# Patient Record
Sex: Male | Born: 1970 | Race: Black or African American | Hispanic: No | Marital: Married | Smoking: Former smoker
Health system: Southern US, Community
[De-identification: ages and names within clinical notes are randomized; demographics above are authoritative.]

## PROBLEM LIST (undated history)

## (undated) DIAGNOSIS — R569 Unspecified convulsions: Secondary | ICD-10-CM

## (undated) DIAGNOSIS — N402 Nodular prostate without lower urinary tract symptoms: Secondary | ICD-10-CM

## (undated) DIAGNOSIS — G4730 Sleep apnea, unspecified: Secondary | ICD-10-CM

## (undated) DIAGNOSIS — R45851 Suicidal ideations: Secondary | ICD-10-CM

## (undated) DIAGNOSIS — F419 Anxiety disorder, unspecified: Secondary | ICD-10-CM

## (undated) DIAGNOSIS — R29898 Other symptoms and signs involving the musculoskeletal system: Secondary | ICD-10-CM

## (undated) DIAGNOSIS — M549 Dorsalgia, unspecified: Secondary | ICD-10-CM

## (undated) DIAGNOSIS — Z87898 Personal history of other specified conditions: Secondary | ICD-10-CM

## (undated) DIAGNOSIS — N35919 Unspecified urethral stricture, male, unspecified site: Secondary | ICD-10-CM

## (undated) DIAGNOSIS — K429 Umbilical hernia without obstruction or gangrene: Secondary | ICD-10-CM

## (undated) DIAGNOSIS — F331 Major depressive disorder, recurrent, moderate: Secondary | ICD-10-CM

## (undated) DIAGNOSIS — K219 Gastro-esophageal reflux disease without esophagitis: Secondary | ICD-10-CM

## (undated) DIAGNOSIS — M1990 Unspecified osteoarthritis, unspecified site: Secondary | ICD-10-CM

## (undated) HISTORY — DX: Unspecified convulsions: R56.9

## (undated) HISTORY — DX: Other symptoms and signs involving the musculoskeletal system: R29.898

## (undated) HISTORY — PX: PROSTATE BIOPSY: SHX241

## (undated) HISTORY — DX: Personal history of other specified conditions: Z87.898

## (undated) HISTORY — DX: Dorsalgia, unspecified: M54.9

## (undated) HISTORY — PX: URETHRAL STRICTURE DILATATION: SHX477

## (undated) HISTORY — DX: Major depressive disorder, recurrent, moderate: F33.1

## (undated) HISTORY — DX: Suicidal ideations: R45.851

## (undated) HISTORY — DX: Nodular prostate without lower urinary tract symptoms: N40.2

## (undated) HISTORY — PX: FRACTURE SURGERY: SHX138

## (undated) HISTORY — DX: Umbilical hernia without obstruction or gangrene: K42.9

---

## 2011-07-24 DIAGNOSIS — R55 Syncope and collapse: Secondary | ICD-10-CM

## 2011-08-27 ENCOUNTER — Ambulatory Visit (HOSPITAL_COMMUNITY)
Admission: RE | Admit: 2011-08-27 | Discharge: 2011-08-27 | Disposition: A | Discharge status: Home / Self Care | Payer: BC Managed Care – PPO | Source: Ambulatory Visit | Attending: Internal Medicine | Admitting: Internal Medicine

## 2011-08-27 DIAGNOSIS — R55 Syncope and collapse: Secondary | ICD-10-CM | POA: Insufficient documentation

## 2011-08-27 DIAGNOSIS — Z1389 Encounter for screening for other disorder: Secondary | ICD-10-CM | POA: Insufficient documentation

## 2011-08-29 NOTE — Procedures (Signed)
NAMEALECSANDER, [place]ABAU[place]              ACCOUNT NO.:  [id number]  MEDICAL RECORD NO.:  [id number]  LOCATION:  EE                           FACILITY:  [facility]  PHYSICIAN:  Haley Fuerstenberg A. Gerilyn Pilgrim, M.D. DATE OF BIRTH:  02-Mar-1971  DATE OF PROCEDURE:  08/27/2011 DATE OF DISCHARGE:  08/27/2011                             EEG INTERPRETATION   INDICATION:  A 41 year old who had a spell of syncope associated with convulsion and confusion.  The episode was preceded by chest pain an hour before.  Cardiac workup has been negative, however.  MEDICATIONS:  Xanax.  ANALYSIS:  A 16 channel recording using standard 10/20 measurement is conducted for 20 minutes.  There is a well-formed posterior dominant rhythm of 11 Hz which attenuates with eye opening.  There is beta activity observed in the frontal areas.  Awake and drowsy activities are recorded.  Photic stimulation and hyperventilation are carried out without abnormal changes in background activity.  There is no focal or lateralized slowing.  There is no epileptiform activity.  IMPRESSION:  Normal recording of awake and drowsy states.  A single recording does not rule out epileptic seizures.  If clinically indicated, a sleep-deprived or extended EEG should be useful.     Roque Schill A. Gerilyn Pilgrim, M.D.     KAD/MEDQ  D:  08/28/2011  T:  08/29/2011  Job:  [id number]

## 2013-07-24 DIAGNOSIS — L0291 Cutaneous abscess, unspecified: Secondary | ICD-10-CM | POA: Insufficient documentation

## 2013-09-01 DIAGNOSIS — J40 Bronchitis, not specified as acute or chronic: Secondary | ICD-10-CM | POA: Insufficient documentation

## 2013-09-01 DIAGNOSIS — R059 Cough, unspecified: Secondary | ICD-10-CM | POA: Insufficient documentation

## 2015-01-23 DIAGNOSIS — R1013 Epigastric pain: Secondary | ICD-10-CM | POA: Insufficient documentation

## 2015-01-23 DIAGNOSIS — R079 Chest pain, unspecified: Secondary | ICD-10-CM | POA: Insufficient documentation

## 2015-01-23 DIAGNOSIS — M25512 Pain in left shoulder: Secondary | ICD-10-CM | POA: Insufficient documentation

## 2015-11-22 DIAGNOSIS — H9190 Unspecified hearing loss, unspecified ear: Secondary | ICD-10-CM | POA: Diagnosis not present

## 2015-11-22 DIAGNOSIS — F804 Speech and language development delay due to hearing loss: Secondary | ICD-10-CM | POA: Diagnosis not present

## 2015-11-28 DIAGNOSIS — H9190 Unspecified hearing loss, unspecified ear: Secondary | ICD-10-CM | POA: Diagnosis not present

## 2015-11-28 DIAGNOSIS — F804 Speech and language development delay due to hearing loss: Secondary | ICD-10-CM | POA: Diagnosis not present

## 2015-11-30 DIAGNOSIS — F804 Speech and language development delay due to hearing loss: Secondary | ICD-10-CM | POA: Diagnosis not present

## 2015-11-30 DIAGNOSIS — H9190 Unspecified hearing loss, unspecified ear: Secondary | ICD-10-CM | POA: Diagnosis not present

## 2015-12-05 DIAGNOSIS — H9190 Unspecified hearing loss, unspecified ear: Secondary | ICD-10-CM | POA: Diagnosis not present

## 2015-12-05 DIAGNOSIS — F804 Speech and language development delay due to hearing loss: Secondary | ICD-10-CM | POA: Diagnosis not present

## 2015-12-07 DIAGNOSIS — H9190 Unspecified hearing loss, unspecified ear: Secondary | ICD-10-CM | POA: Diagnosis not present

## 2015-12-07 DIAGNOSIS — F804 Speech and language development delay due to hearing loss: Secondary | ICD-10-CM | POA: Diagnosis not present

## 2016-01-02 DIAGNOSIS — M7551 Bursitis of right shoulder: Secondary | ICD-10-CM | POA: Insufficient documentation

## 2016-01-03 DIAGNOSIS — M7551 Bursitis of right shoulder: Secondary | ICD-10-CM | POA: Diagnosis not present

## 2016-01-03 DIAGNOSIS — M25511 Pain in right shoulder: Secondary | ICD-10-CM | POA: Diagnosis not present

## 2016-01-03 DIAGNOSIS — Z6830 Body mass index (BMI) 30.0-30.9, adult: Secondary | ICD-10-CM | POA: Diagnosis not present

## 2016-10-13 DIAGNOSIS — Z6830 Body mass index (BMI) 30.0-30.9, adult: Secondary | ICD-10-CM | POA: Insufficient documentation

## 2016-10-13 DIAGNOSIS — Z0000 Encounter for general adult medical examination without abnormal findings: Secondary | ICD-10-CM | POA: Insufficient documentation

## 2016-10-14 DIAGNOSIS — Z0000 Encounter for general adult medical examination without abnormal findings: Secondary | ICD-10-CM | POA: Diagnosis not present

## 2016-10-14 DIAGNOSIS — M545 Low back pain: Secondary | ICD-10-CM | POA: Diagnosis not present

## 2016-10-14 DIAGNOSIS — Z6830 Body mass index (BMI) 30.0-30.9, adult: Secondary | ICD-10-CM | POA: Diagnosis not present

## 2018-05-26 DIAGNOSIS — N393 Stress incontinence (female) (male): Secondary | ICD-10-CM | POA: Insufficient documentation

## 2018-05-26 DIAGNOSIS — K429 Umbilical hernia without obstruction or gangrene: Secondary | ICD-10-CM | POA: Diagnosis not present

## 2018-05-26 DIAGNOSIS — M62838 Other muscle spasm: Secondary | ICD-10-CM | POA: Insufficient documentation

## 2018-05-26 DIAGNOSIS — K4090 Unilateral inguinal hernia, without obstruction or gangrene, not specified as recurrent: Secondary | ICD-10-CM | POA: Diagnosis not present

## 2018-05-26 DIAGNOSIS — R339 Retention of urine, unspecified: Secondary | ICD-10-CM | POA: Diagnosis not present

## 2018-05-26 DIAGNOSIS — R319 Hematuria, unspecified: Secondary | ICD-10-CM | POA: Diagnosis not present

## 2018-05-26 DIAGNOSIS — M549 Dorsalgia, unspecified: Secondary | ICD-10-CM | POA: Diagnosis not present

## 2018-05-26 DIAGNOSIS — Z79899 Other long term (current) drug therapy: Secondary | ICD-10-CM | POA: Diagnosis not present

## 2018-05-26 DIAGNOSIS — K654 Sclerosing mesenteritis: Secondary | ICD-10-CM | POA: Diagnosis not present

## 2018-05-26 DIAGNOSIS — Z5321 Procedure and treatment not carried out due to patient leaving prior to being seen by health care provider: Secondary | ICD-10-CM | POA: Diagnosis not present

## 2018-05-27 ENCOUNTER — Emergency Department (HOSPITAL_COMMUNITY): Payer: BLUE CROSS/BLUE SHIELD

## 2018-05-27 ENCOUNTER — Other Ambulatory Visit: Payer: Self-pay

## 2018-05-27 ENCOUNTER — Encounter (HOSPITAL_COMMUNITY): Payer: Self-pay | Admitting: Emergency Medicine

## 2018-05-27 ENCOUNTER — Emergency Department (HOSPITAL_COMMUNITY)
Admission: EM | Admit: 2018-05-27 | Discharge: 2018-05-27 | Disposition: A | Discharge status: Home / Self Care | Payer: BLUE CROSS/BLUE SHIELD | Attending: Emergency Medicine | Admitting: Emergency Medicine

## 2018-05-27 DIAGNOSIS — N3091 Cystitis, unspecified with hematuria: Secondary | ICD-10-CM | POA: Diagnosis not present

## 2018-05-27 DIAGNOSIS — R319 Hematuria, unspecified: Secondary | ICD-10-CM | POA: Diagnosis not present

## 2018-05-27 DIAGNOSIS — N433 Hydrocele, unspecified: Secondary | ICD-10-CM

## 2018-05-27 DIAGNOSIS — N341 Nonspecific urethritis: Secondary | ICD-10-CM | POA: Insufficient documentation

## 2018-05-27 DIAGNOSIS — N3090 Cystitis, unspecified without hematuria: Secondary | ICD-10-CM

## 2018-05-27 DIAGNOSIS — Z87891 Personal history of nicotine dependence: Secondary | ICD-10-CM | POA: Diagnosis not present

## 2018-05-27 DIAGNOSIS — N342 Other urethritis: Secondary | ICD-10-CM

## 2018-05-27 DIAGNOSIS — N50819 Testicular pain, unspecified: Secondary | ICD-10-CM

## 2018-05-27 HISTORY — DX: Unspecified urethral stricture, male, unspecified site: N35.919

## 2018-05-27 HISTORY — DX: Gastro-esophageal reflux disease without esophagitis: K21.9

## 2018-05-27 LAB — BASIC METABOLIC PANEL
Anion gap: 9 (ref 5–15)
BUN: 16 mg/dL (ref 6–20)
CO2: 23 mmol/L (ref 22–32)
Calcium: 9 mg/dL (ref 8.9–10.3)
Chloride: 106 mmol/L (ref 98–111)
Creatinine, Ser: 1.12 mg/dL (ref 0.61–1.24)
GFR calc Af Amer: >60 mL/min (ref >60)
GFR calc non Af Amer: >60 mL/min (ref >60)
Glucose, Bld: 118 mg/dL — ABNORMAL HIGH (ref 70–99)
Potassium: 3.9 mmol/L (ref 3.5–5.1)
Sodium: 138 mmol/L (ref 135–145)

## 2018-05-27 LAB — URINALYSIS, ROUTINE W REFLEX MICROSCOPIC
Bacteria, UA: NONE SEEN
Bilirubin Urine: NEGATIVE
Glucose, UA: NEGATIVE mg/dL
Ketones, ur: NEGATIVE mg/dL
Nitrite: NEGATIVE
Protein, ur: NEGATIVE mg/dL
RBC / HPF: >50 RBC/hpf — ABNORMAL HIGH (ref 0–5)
Specific Gravity, Urine: 1.031 — ABNORMAL HIGH (ref 1.005–1.030)
pH: 5 (ref 5.0–8.0)

## 2018-05-27 LAB — CBC WITH DIFFERENTIAL/PLATELET
Abs Immature Granulocytes: 0.02 10*3/uL (ref 0.00–0.07)
Basophils Absolute: 0 10*3/uL (ref 0.0–0.1)
Basophils Relative: 0 %
Eosinophils Absolute: 0 10*3/uL (ref 0.0–0.5)
Eosinophils Relative: 0 %
HCT: 47.9 % (ref 39.0–52.0)
Hemoglobin: 16.2 g/dL (ref 13.0–17.0)
Immature Granulocytes: 0 %
Lymphocytes Relative: 20 %
Lymphs Abs: 1.4 10*3/uL (ref 0.7–4.0)
MCH: 28.4 pg (ref 26.0–34.0)
MCHC: 33.8 g/dL (ref 30.0–36.0)
MCV: 84 fL (ref 80.0–100.0)
Monocytes Absolute: 0.4 10*3/uL (ref 0.1–1.0)
Monocytes Relative: 5 %
Neutro Abs: 5 10*3/uL (ref 1.7–7.7)
Neutrophils Relative %: 75 %
Platelets: 189 10*3/uL (ref 150–400)
RBC: 5.7 MIL/uL (ref 4.22–5.81)
RDW: 12.2 % (ref 11.5–15.5)
WBC: 6.7 10*3/uL (ref 4.0–10.5)
nRBC: 0 % (ref 0.0–0.2)

## 2018-05-27 MED ORDER — OXYCODONE-ACETAMINOPHEN 5-325 MG PO TABS: ORAL_TABLET | ORAL | 0 refills | Status: DC

## 2018-05-27 MED ORDER — LIDOCAINE HCL (PF) 1 % IJ SOLN
INTRAMUSCULAR | Status: AC
  Administered 2018-05-27: 2 mL
  Filled 2018-05-27: qty 2

## 2018-05-27 MED ORDER — CIPROFLOXACIN HCL 500 MG PO TABS: 500.0000 mg | ORAL_TABLET | Freq: Two times a day (BID) | ORAL | 0 refills | Status: DC

## 2018-05-27 MED ORDER — AZITHROMYCIN 250 MG PO TABS
1000.0000 mg | ORAL_TABLET | Freq: Once | ORAL | Status: AC
  Administered 2018-05-27: 1000 mg via ORAL
  Filled 2018-05-27: qty 4

## 2018-05-27 MED ORDER — CEFTRIAXONE SODIUM 250 MG IJ SOLR
250.0000 mg | Freq: Once | INTRAMUSCULAR | Status: AC
  Administered 2018-05-27: 250 mg via INTRAMUSCULAR
  Filled 2018-05-27: qty 250

## 2018-05-27 MED ORDER — OXYCODONE-ACETAMINOPHEN 5-325 MG PO TABS
2.0000 | ORAL_TABLET | Freq: Once | ORAL | Status: AC
  Administered 2018-05-27: 2 via ORAL
  Filled 2018-05-27: qty 2

## 2018-05-27 MED ORDER — HYDROMORPHONE HCL 1 MG/ML IJ SOLN
1.0000 mg | Freq: Once | INTRAMUSCULAR | Status: AC
  Administered 2018-05-27: 1 mg via INTRAVENOUS
  Filled 2018-05-27: qty 1

## 2018-05-27 NOTE — ED Provider Notes (Signed)
[facility] EMERGENCY DEPARTMENT Provider Note   CSN: [id number] Arrival date & time: 05/27/18  0757     History   Chief Complaint Chief Complaint  Patient presents with  . Hematuria    HPI Dale Bender is a 47 y.o. male.   Hematuria     Pt was seen at 0810. Per pt and his wife, c/o gradual onset and persistence of constant dysuria and hematuria since yesterday. Has been associated with suprapubic abd and testicular area "pain."  Pt states he was evaluated at [facility] yesterday, and was told he "needed to see a Urologist for a stricture." Denies flank pain, no fevers, no abd pain, no N/V/D, no rash.   Past Medical History:  Diagnosis Date  . GERD (gastroesophageal reflux disease)   . 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   Past Surgical History:  Procedure Laterality Date  . FRACTURE SURGERY     LT arm, LT leg  . URETHRAL STRICTURE DILATATION          Home Medications    Prior to Admission medications   Not on File    Family History No family history on file.  Social History Social History   Tobacco Use  . Smoking status: Former Research scientist (life sciences)  . Smokeless tobacco: Never Used  Substance Use Topics  . Alcohol use: Not Currently  . Drug use: Not Currently     Allergies   Patient has no known allergies.   Review of Systems Review of Systems  Genitourinary: Positive for hematuria.  ROS: Statement: All systems negative except as marked or noted in the HPI; Constitutional: Negative for fever and chills. ; ; Eyes: Negative for eye pain, redness and discharge. ; ; ENMT: Negative for ear pain, hoarseness, nasal congestion, sinus pressure and sore throat. ; ; Cardiovascular: Negative for chest pain, palpitations, diaphoresis, dyspnea and peripheral edema. ; ; Respiratory: Negative for cough, wheezing and stridor. ; ; Gastrointestinal: +suprapubic abd pain. Negative for nausea, vomiting, diarrhea, blood  in stool, hematemesis, jaundice and rectal bleeding. . ; ; Genitourinary: Negative for flank pain. +dysuria and hematuria. ; ; Genital:  No penile drainage or rash, +testicular pain, no testicular swelling, no scrotal rash or swelling. ;; Musculoskeletal: Negative for back pain and neck pain. Negative for swelling and trauma.; ; Skin: Negative for pruritus, rash, abrasions, blisters, bruising and skin lesion.; ; Neuro: Negative for headache, lightheadedness and neck stiffness. Negative for weakness, altered level of consciousness, altered mental status, extremity weakness, paresthesias, involuntary movement, seizure and syncope.       Physical Exam Updated Vital Signs BP 113/79 (BP Location: Right Arm)   Pulse 100   Temp 98.1 F (36.7 C) (Oral)   Resp 20   Ht 6\' 2"  (1.88 m)   Wt 106.6 kg   SpO2 100%   BMI 30.17 kg/m   Physical Exam 0815: Physical examination:  Nursing notes reviewed; Vital signs and O2 SAT reviewed;  Constitutional: Well developed, Well nourished, Well hydrated, Uncomfortable appearing.; Head:  Normocephalic, atraumatic; Eyes: EOMI, PERRL, No scleral icterus; ENMT: Mouth and pharynx normal, Mucous membranes moist; Neck: Supple, Full range of motion, No lymphadenopathy; Cardiovascular: Regular rate and rhythm, No gallop; Respiratory: Breath sounds clear & equal bilaterally, No wheezes.  Speaking full sentences with ease, Normal respiratory effort/excursion; Chest: Nontender, Movement normal; Abdomen: Soft, +mild suprapubic tenderness to palp. No rebound or guarding. Nondistended, Normal bowel sounds; Genitourinary: No CVA tenderness.  Genital exam performed with pt permission and male ED RN chaperone present during exam.  No perineal erythema.  No penile lesions or drainage.  No scrotal erythema, edema or tenderness to palp.  Normal testicular lie.  No testicular tenderness to palp.  +cremasteric reflexes bilat.  No inguinal LAN or palpable masses.;;; Extremities: Peripheral pulses  normal, No tenderness, No edema, No calf edema or asymmetry.; Neuro: AA&Ox3, Major CN grossly intact.  Speech clear. No gross focal motor or sensory deficits in extremities.; Skin: Color normal, Warm, Dry.   ED Treatments / Results  Labs (all labs ordered are listed, but only abnormal results are displayed)   EKG None  Radiology   Procedures Procedures (including critical care time)  Medications Ordered in ED Medications  HYDROmorphone (DILAUDID) injection 1 mg (1 mg Intravenous Given 05/27/18 0831)     Initial Impression / Assessment and Plan / ED Course  I have reviewed the triage vital signs and the nursing notes.  Pertinent labs & imaging results that were available during my care of the patient were reviewed by me and considered in my medical decision making (see chart for details).  MDM Reviewed: previous chart, nursing note and vitals Interpretation: labs, ultrasound and CT scan   Results for orders placed or performed during the hospital encounter of 05/27/18  Urinalysis, Routine w reflex microscopic  Result Value Ref Range   Color, Urine YELLOW YELLOW   APPearance HAZY (A) CLEAR   Specific Gravity, Urine 1.031 (H) 1.005 - 1.030   pH 5.0 5.0 - 8.0   Glucose, UA NEGATIVE NEGATIVE mg/dL   Hgb urine dipstick MODERATE (A) NEGATIVE   Bilirubin Urine NEGATIVE NEGATIVE   Ketones, ur NEGATIVE NEGATIVE mg/dL   Protein, ur NEGATIVE NEGATIVE mg/dL   Nitrite NEGATIVE NEGATIVE   Leukocytes, UA SMALL (A) NEGATIVE   RBC / HPF >50 (H) 0 - 5 RBC/hpf   WBC, UA 11-20 0 - 5 WBC/hpf   Bacteria, UA NONE SEEN NONE SEEN   Squamous Epithelial / LPF 0-5 0 - 5   Mucus PRESENT   Basic metabolic panel  Result Value Ref Range   Sodium 138 135 - 145 mmol/L   Potassium 3.9 3.5 - 5.1 mmol/L   Chloride 106 98 - 111 mmol/L   CO2 23 22 - 32 mmol/L   Glucose, Bld 118 (H) 70 - 99 mg/dL   BUN 16 6 - 20 mg/dL   Creatinine, Ser 1.12 0.61 - 1.24 mg/dL   Calcium 9.0 8.9 - 10.3 mg/dL   GFR  calc non Af Amer >60 >60 mL/min   GFR calc Af Amer >60 >60 mL/min   Anion gap 9 5 - 15  CBC with Differential  Result Value Ref Range   WBC 6.7 4.0 - 10.5 K/uL   RBC 5.70 4.22 - 5.81 MIL/uL   Hemoglobin 16.2 13.0 - 17.0 g/dL   HCT 47.9 39.0 - 52.0 %   MCV 84.0 80.0 - 100.0 fL   MCH 28.4 26.0 - 34.0 pg   MCHC 33.8 30.0 - 36.0 g/dL   RDW 12.2 11.5 - 15.5 %   Platelets 189 150 - 400 K/uL   nRBC 0.0 0.0 - 0.2 %   Neutrophils Relative % 75 %   Neutro Abs 5.0 1.7 - 7.7 K/uL   Lymphocytes Relative 20 %   Lymphs Abs 1.4 0.7 - 4.0 K/uL   Monocytes Relative 5 %   Monocytes Absolute 0.4 0.1 - 1.0 K/uL   Eosinophils Relative 0 %  Eosinophils Absolute 0.0 0.0 - 0.5 K/uL   Basophils Relative 0 %   Basophils Absolute 0.0 0.0 - 0.1 K/uL   Immature Granulocytes 0 %   Abs Immature Granulocytes 0.02 0.00 - 0.07 K/uL   Ct Renal Stone Study Result Date: 05/27/2018 CLINICAL DATA:  Hematuria. Pelvic pain and groin pain which began yesterday. EXAM: CT ABDOMEN AND PELVIS WITHOUT CONTRAST TECHNIQUE: Multidetector CT imaging of the abdomen and pelvis was performed following the standard protocol without IV contrast. COMPARISON:  None. FINDINGS: Lower chest: No significant abnormality. Hepatobiliary: No focal liver abnormality is seen. No gallstones, gallbladder wall thickening, or biliary dilatation. Pancreas: Unremarkable. No pancreatic ductal dilatation or surrounding inflammatory changes. Spleen: Normal in size without focal abnormality. Adrenals/Urinary Tract: Adrenal glands are unremarkable. Kidneys are normal, without renal calculi, focal lesion, or hydronephrosis. Bladder is unremarkable. Stomach/Bowel: Stomach is within normal limits. Appendix appears normal. No evidence of bowel wall thickening, distention, or inflammatory changes. Vascular/Lymphatic: No significant vascular findings are present. No enlarged abdominal or pelvic lymph nodes. Reproductive: Prostate is unremarkable. Other: No abdominal  wall hernia or abnormality. No abdominopelvic ascites. Musculoskeletal: No acute or significant osseous findings. IMPRESSION: Benign-appearing abdomen and pelvis. Electronically Signed   By: Lorriane Shire M.D.   On: 05/27/2018 10:22   US Scrotum W/doppler Result Date: 05/27/2018 CLINICAL DATA:  Testicular pain EXAM: SCROTAL ULTRASOUND DOPPLER ULTRASOUND OF THE TESTICLES TECHNIQUE: Complete ultrasound examination of the testicles, epididymis, and other scrotal structures was performed. Color and spectral Doppler ultrasound were also utilized to evaluate blood flow to the testicles. COMPARISON:  None. FINDINGS: Right testicle Measurements: 4.4 x 2.1 x 2.7 cm. No mass or microlithiasis visualized. Left testicle Measurements: 4.1 x 2.1 x 3.2 cm. No mass or microlithiasis visualized. Right epididymis:  Normal in size and appearance. Left epididymis:  Normal in size and appearance. Hydrocele:  Small left hydrocele. Varicocele:  None visualized. Pulsed Doppler interrogation of both testes demonstrates normal low resistance arterial and venous waveforms bilaterally. IMPRESSION: 1. No testicular torsion. 2. No testicular mass. 3. Small left hydrocele. Electronically Signed   By: Kathreen Devoid   On: 05/27/2018 09:29    1115: Initial bladder scan with 42ml. Pt spontaneously voided while in the ED, and bladder empty on repeat scan. +UTI, UC pending. Will tx for urethritis with IM rocephin/PO zithromax and rx cipro while UC pending. Pt voiding freely and completely; doubt stricture at this time. Pt and family member both insistent that pt "has a urethral stricture" and that he "needs a Urologist now to open up his urethra." ED RN and I explained to pt and his family multiple times that he is voiding freely, not having urinary retention, which speaks against a urethral stricture dx. Pt and family continue insistent they "need to see a Urologist to get surgery NOW!"  87:  T/C returned from [place] Dr. Matilde Sprang, case  discussed, including:  HPI, pertinent PM/SHx, VS/PE, dx testing, ED course and treatment:  Agrees with ED treatment and plan, pt's symptoms not c/w urethral stricture, f/u office as outpt. Pt has tol PO well while in the ED without N/V. No flank pain, no fevers. No clear indication for admission at this time. Dx and testing, as well as d/w Uro MD, d/w pt and family.  Questions answered.  Verb understanding, agreeable to d/c home with outpt f/u.     Final Clinical Impressions(s) / ED Diagnoses   Final diagnoses:  Testicle pain    ED Discharge Orders    None  Francine Graven, DO 05/30/18 [phone]

## 2018-05-27 NOTE — ED Triage Notes (Signed)
Pt c/o hematuria, pelvic pain, and groin pain that began yesterday. Pt evaluated at [facility] last night, but they did not have urology coverage. Pt dx with urethral stricture. Hx of same with 2 surgeries.

## 2018-05-27 NOTE — ED Notes (Signed)
US at bedside

## 2018-05-27 NOTE — Discharge Instructions (Addendum)
Take the prescriptions as directed. Increase your fluid intake for the next several days.  Call your regular medical doctor today to schedule a follow up appointment within the next 3 days. Call the Urologist today to schedule a follow up appointment within the next week.  Return to the Emergency Department immediately sooner if worsening.

## 2018-05-29 LAB — URINE CULTURE: Culture: <10000 — AB

## 2018-06-01 ENCOUNTER — Other Ambulatory Visit: Payer: Self-pay

## 2018-06-01 ENCOUNTER — Encounter (HOSPITAL_COMMUNITY): Payer: Self-pay | Admitting: Emergency Medicine

## 2018-06-01 ENCOUNTER — Emergency Department (HOSPITAL_COMMUNITY)
Admission: EM | Admit: 2018-06-01 | Discharge: 2018-06-01 | Disposition: A | Discharge status: Home / Self Care | Payer: BLUE CROSS/BLUE SHIELD | Attending: Emergency Medicine | Admitting: Emergency Medicine

## 2018-06-01 DIAGNOSIS — Z87891 Personal history of nicotine dependence: Secondary | ICD-10-CM | POA: Diagnosis not present

## 2018-06-01 DIAGNOSIS — R319 Hematuria, unspecified: Secondary | ICD-10-CM | POA: Insufficient documentation

## 2018-06-01 DIAGNOSIS — Z79899 Other long term (current) drug therapy: Secondary | ICD-10-CM | POA: Diagnosis not present

## 2018-06-01 LAB — CBC
HCT: 47.7 % (ref 39.0–52.0)
Hemoglobin: 16.1 g/dL (ref 13.0–17.0)
MCH: 28.8 pg (ref 26.0–34.0)
MCHC: 33.8 g/dL (ref 30.0–36.0)
MCV: 85.3 fL (ref 80.0–100.0)
Platelets: 203 10*3/uL (ref 150–400)
RBC: 5.59 MIL/uL (ref 4.22–5.81)
RDW: 12 % (ref 11.5–15.5)
WBC: 5 10*3/uL (ref 4.0–10.5)
nRBC: 0 % (ref 0.0–0.2)

## 2018-06-01 LAB — URINALYSIS, ROUTINE W REFLEX MICROSCOPIC
Bilirubin Urine: NEGATIVE
Glucose, UA: NEGATIVE mg/dL
Ketones, ur: NEGATIVE mg/dL
Leukocytes, UA: NEGATIVE
Nitrite: NEGATIVE
Protein, ur: NEGATIVE mg/dL
RBC / HPF: >50 RBC/hpf — ABNORMAL HIGH (ref 0–5)
Specific Gravity, Urine: 1.023 (ref 1.005–1.030)
pH: 6 (ref 5.0–8.0)

## 2018-06-01 LAB — BASIC METABOLIC PANEL
Anion gap: 9 (ref 5–15)
BUN: 16 mg/dL (ref 6–20)
CO2: 23 mmol/L (ref 22–32)
Calcium: 9 mg/dL (ref 8.9–10.3)
Chloride: 105 mmol/L (ref 98–111)
Creatinine, Ser: 1.19 mg/dL (ref 0.61–1.24)
GFR calc Af Amer: >60 mL/min (ref >60)
GFR calc non Af Amer: >60 mL/min (ref >60)
Glucose, Bld: 88 mg/dL (ref 70–99)
Potassium: 3.7 mmol/L (ref 3.5–5.1)
Sodium: 137 mmol/L (ref 135–145)

## 2018-06-01 MED ORDER — ONDANSETRON HCL 4 MG/2ML IJ SOLN
4.0000 mg | Freq: Once | INTRAMUSCULAR | Status: AC
  Administered 2018-06-01: 4 mg via INTRAVENOUS
  Filled 2018-06-01: qty 2

## 2018-06-01 MED ORDER — HYDROMORPHONE HCL 1 MG/ML IJ SOLN
1.0000 mg | Freq: Once | INTRAMUSCULAR | Status: AC
  Administered 2018-06-01: 1 mg via INTRAVENOUS
  Filled 2018-06-01: qty 1

## 2018-06-01 MED ORDER — CIPROFLOXACIN HCL 500 MG PO TABS: 500.0000 mg | ORAL_TABLET | Freq: Two times a day (BID) | ORAL | 0 refills | Status: DC

## 2018-06-01 NOTE — ED Triage Notes (Addendum)
Pt has had blood in urine since the 15th with suprapubic pain. Pt has hx of urethral stricture / dilation. Treated with cipro.

## 2018-06-01 NOTE — ED Provider Notes (Signed)
[place] EMERGENCY DEPARTMENT Provider Note   CSN: [id number] Arrival date & time: 06/01/18  3474     History   Chief Complaint Chief Complaint  Patient presents with  . Hematuria    HPI Dale Bender is a 47 y.o. male.  The history is provided by the patient. No language interpreter was used.  Hematuria  This is a new problem. The current episode started more than 1 week ago. The problem occurs constantly. The problem has been gradually worsening. Pertinent negatives include no chest pain and no abdominal pain. Nothing aggravates the symptoms. Nothing relieves the symptoms. He has tried nothing for the symptoms. The treatment provided no relief.  Pt reports he has a urethral stricture.  Pt was seen at [facility] on 10/16 and had a ct scan.  Pt was treated with cipro and advised to scheduled to see Dr. Matilde Sprang for evaluation.  Pt reports today he has been passing blood clots.  Pt feels like he can not urinate.   Past Medical History:  Diagnosis Date  . GERD (gastroesophageal reflux disease)   . 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   Past Surgical History:  Procedure Laterality Date  . FRACTURE SURGERY     LT arm, LT leg  . URETHRAL STRICTURE DILATATION          Home Medications    Prior to Admission medications   Medication Sig Start Date End Date Taking? Authorizing Provider  ciprofloxacin (CIPRO) 500 MG tablet Take 1 tablet (500 mg total) by mouth 2 (two) times daily. 05/27/18  Yes Francine Graven, DO  oxyCODONE-acetaminophen (PERCOCET/ROXICET) 5-325 MG tablet 1 or 2 tabs PO q8h prn pain Patient taking differently: Take 1-2 tablets by mouth every 8 (eight) hours as needed for moderate pain.  05/27/18  Yes Francine Graven, DO  ranitidine (ZANTAC) 150 MG capsule Take 150 mg by mouth daily.   Yes [provider]    Family History No family history on file.  Social  History Social History   Tobacco Use  . Smoking status: Former Research scientist (life sciences)  . Smokeless tobacco: Never Used  Substance Use Topics  . Alcohol use: Not Currently  . Drug use: Not Currently     Allergies   Patient has no known allergies.   Review of Systems Review of Systems  Cardiovascular: Negative for chest pain.  Gastrointestinal: Negative for abdominal pain.  Genitourinary: Positive for hematuria.  All other systems reviewed and are negative.    Physical Exam Updated Vital Signs BP (!) 131/91 (BP Location: Right Arm)   Pulse 76   Temp 98.8 F (37.1 C)   Resp 19   SpO2 97%   Physical Exam  Constitutional: He appears well-developed and well-nourished.  HENT:  Head: Normocephalic and atraumatic.  Eyes: Conjunctivae are normal.  Neck: Neck supple.  Cardiovascular: Normal rate and regular rhythm.  No murmur heard. Pulmonary/Chest: Effort normal and breath sounds normal. No respiratory distress.  Abdominal: Soft. There is no tenderness.  Musculoskeletal: He exhibits no edema.  Neurological: He is alert.  Skin: Skin is warm and dry.  Psychiatric: He has a normal mood and affect.  Nursing note and vitals reviewed.    ED Treatments / Results  Labs (all labs ordered are listed, but only abnormal results are displayed) Labs Reviewed  URINALYSIS, ROUTINE W REFLEX MICROSCOPIC - Abnormal; Notable for the following components:      Result  Value   Hgb urine dipstick LARGE (*)    RBC / HPF >50 (*)    Bacteria, UA RARE (*)    All other components within normal limits  BASIC METABOLIC PANEL  CBC    EKG None  Radiology No results found.  Procedures Procedures (including critical care time)  Medications Ordered in ED Medications  HYDROmorphone (DILAUDID) injection 1 mg (1 mg Intravenous Given 06/01/18 1034)  ondansetron (ZOFRAN) injection 4 mg (4 mg Intravenous Given 06/01/18 1034)     Initial Impression / Assessment and Plan / ED Course  I have reviewed the  triage vital signs and the nursing notes.  Pertinent labs & imaging results that were available during my care of the patient were reviewed by me and considered in my medical decision making (see chart for details).     I spoke to Dr. Lovena Neighbours who advised to place a 14 foley.  Pt is given cipro.  He is advised to schedule to see Dr. Matilde Sprang as scheduled   Final Clinical Impressions(s) / ED Diagnoses   Final diagnoses:  Hematuria, unspecified type    ED Discharge Orders         Ordered    ciprofloxacin (CIPRO) 500 MG tablet  2 times daily     06/01/18 1209        An After Visit Summary was printed and given to the patient.    Fransico Meadow, PA-C 06/01/18 1209    Sherwood Gambler, MD 06/01/18 [phone]

## 2018-06-01 NOTE — ED Notes (Signed)
Pt discharged from ED; instructions provided and scripts given; Pt encouraged to return to ED if symptoms worsen and to f/u with PCP; Pt verbalized understanding of all instructions 

## 2018-06-08 DIAGNOSIS — R338 Other retention of urine: Secondary | ICD-10-CM | POA: Diagnosis not present

## 2018-06-08 DIAGNOSIS — N402 Nodular prostate without lower urinary tract symptoms: Secondary | ICD-10-CM | POA: Diagnosis not present

## 2018-06-08 DIAGNOSIS — R310 Gross hematuria: Secondary | ICD-10-CM | POA: Diagnosis not present

## 2018-06-18 DIAGNOSIS — R310 Gross hematuria: Secondary | ICD-10-CM | POA: Diagnosis not present

## 2018-07-03 ENCOUNTER — Other Ambulatory Visit: Payer: Self-pay

## 2018-07-03 ENCOUNTER — Emergency Department (HOSPITAL_COMMUNITY): Payer: BLUE CROSS/BLUE SHIELD

## 2018-07-03 ENCOUNTER — Observation Stay (HOSPITAL_COMMUNITY)
Admission: EM | Admit: 2018-07-03 | Discharge: 2018-07-05 | Disposition: A | Discharge status: Home / Self Care | Payer: BLUE CROSS/BLUE SHIELD | Attending: Internal Medicine | Admitting: Internal Medicine

## 2018-07-03 ENCOUNTER — Observation Stay (HOSPITAL_COMMUNITY): Payer: BLUE CROSS/BLUE SHIELD

## 2018-07-03 ENCOUNTER — Encounter (HOSPITAL_COMMUNITY): Payer: Self-pay | Admitting: Emergency Medicine

## 2018-07-03 DIAGNOSIS — K219 Gastro-esophageal reflux disease without esophagitis: Secondary | ICD-10-CM | POA: Diagnosis present

## 2018-07-03 DIAGNOSIS — R1084 Generalized abdominal pain: Secondary | ICD-10-CM | POA: Diagnosis not present

## 2018-07-03 DIAGNOSIS — R112 Nausea with vomiting, unspecified: Secondary | ICD-10-CM | POA: Insufficient documentation

## 2018-07-03 DIAGNOSIS — R319 Hematuria, unspecified: Secondary | ICD-10-CM | POA: Diagnosis present

## 2018-07-03 DIAGNOSIS — Z9889 Other specified postprocedural states: Secondary | ICD-10-CM | POA: Diagnosis not present

## 2018-07-03 DIAGNOSIS — Z87891 Personal history of nicotine dependence: Secondary | ICD-10-CM | POA: Insufficient documentation

## 2018-07-03 DIAGNOSIS — N402 Nodular prostate without lower urinary tract symptoms: Secondary | ICD-10-CM | POA: Diagnosis not present

## 2018-07-03 DIAGNOSIS — Z833 Family history of diabetes mellitus: Secondary | ICD-10-CM | POA: Insufficient documentation

## 2018-07-03 DIAGNOSIS — R300 Dysuria: Secondary | ICD-10-CM | POA: Insufficient documentation

## 2018-07-03 DIAGNOSIS — G40901 Epilepsy, unspecified, not intractable, with status epilepticus: Secondary | ICD-10-CM | POA: Diagnosis not present

## 2018-07-03 DIAGNOSIS — R569 Unspecified convulsions: Secondary | ICD-10-CM | POA: Diagnosis not present

## 2018-07-03 DIAGNOSIS — R42 Dizziness and giddiness: Secondary | ICD-10-CM | POA: Insufficient documentation

## 2018-07-03 DIAGNOSIS — R110 Nausea: Secondary | ICD-10-CM | POA: Diagnosis not present

## 2018-07-03 DIAGNOSIS — R0689 Other abnormalities of breathing: Secondary | ICD-10-CM | POA: Diagnosis not present

## 2018-07-03 DIAGNOSIS — R4020 Unspecified coma: Secondary | ICD-10-CM | POA: Diagnosis not present

## 2018-07-03 DIAGNOSIS — N429 Disorder of prostate, unspecified: Secondary | ICD-10-CM | POA: Diagnosis not present

## 2018-07-03 DIAGNOSIS — Z79899 Other long term (current) drug therapy: Secondary | ICD-10-CM | POA: Diagnosis not present

## 2018-07-03 LAB — COMPREHENSIVE METABOLIC PANEL
ALT: 37 U/L (ref 0–44)
AST: 36 U/L (ref 15–41)
Albumin: 3.9 g/dL (ref 3.5–5.0)
Alkaline Phosphatase: 59 U/L (ref 38–126)
Anion gap: 12 (ref 5–15)
BUN: 13 mg/dL (ref 6–20)
CO2: 20 mmol/L — ABNORMAL LOW (ref 22–32)
Calcium: 9 mg/dL (ref 8.9–10.3)
Chloride: 105 mmol/L (ref 98–111)
Creatinine, Ser: 1.24 mg/dL (ref 0.61–1.24)
GFR calc Af Amer: >60 mL/min (ref >60)
GFR calc non Af Amer: >60 mL/min (ref >60)
Glucose, Bld: 109 mg/dL — ABNORMAL HIGH (ref 70–99)
Potassium: 4.5 mmol/L (ref 3.5–5.1)
Sodium: 137 mmol/L (ref 135–145)
Total Bilirubin: 2.1 mg/dL — ABNORMAL HIGH (ref 0.3–1.2)
Total Protein: 6.6 g/dL (ref 6.5–8.1)

## 2018-07-03 LAB — CBC WITH DIFFERENTIAL/PLATELET
Abs Immature Granulocytes: 0.01 10*3/uL (ref 0.00–0.07)
Basophils Absolute: 0 10*3/uL (ref 0.0–0.1)
Basophils Relative: 0 %
Eosinophils Absolute: 0 10*3/uL (ref 0.0–0.5)
Eosinophils Relative: 0 %
HCT: 47.5 % (ref 39.0–52.0)
Hemoglobin: 15.9 g/dL (ref 13.0–17.0)
Immature Granulocytes: 0 %
Lymphocytes Relative: 33 %
Lymphs Abs: 1.6 10*3/uL (ref 0.7–4.0)
MCH: 28.8 pg (ref 26.0–34.0)
MCHC: 33.5 g/dL (ref 30.0–36.0)
MCV: 85.9 fL (ref 80.0–100.0)
Monocytes Absolute: 0.4 10*3/uL (ref 0.1–1.0)
Monocytes Relative: 7 %
Neutro Abs: 2.9 10*3/uL (ref 1.7–7.7)
Neutrophils Relative %: 60 %
Platelets: 179 10*3/uL (ref 150–400)
RBC: 5.53 MIL/uL (ref 4.22–5.81)
RDW: 12 % (ref 11.5–15.5)
WBC: 4.9 10*3/uL (ref 4.0–10.5)
nRBC: 0 % (ref 0.0–0.2)

## 2018-07-03 LAB — URINALYSIS, ROUTINE W REFLEX MICROSCOPIC
Bacteria, UA: NONE SEEN
Bilirubin Urine: NEGATIVE
Glucose, UA: NEGATIVE mg/dL
Ketones, ur: NEGATIVE mg/dL
Leukocytes, UA: NEGATIVE
Nitrite: NEGATIVE
Protein, ur: NEGATIVE mg/dL
RBC / HPF: >50 RBC/hpf — ABNORMAL HIGH (ref 0–5)
Specific Gravity, Urine: 1.015 (ref 1.005–1.030)
pH: 8 (ref 5.0–8.0)

## 2018-07-03 LAB — RAPID URINE DRUG SCREEN, HOSP PERFORMED
Amphetamines: NOT DETECTED
Barbiturates: NOT DETECTED
Benzodiazepines: POSITIVE — AB
Cocaine: NOT DETECTED
Opiates: POSITIVE — AB
Tetrahydrocannabinol: NOT DETECTED

## 2018-07-03 LAB — PROTIME-INR
INR: 0.97
Prothrombin Time: 12.8 seconds (ref 11.4–15.2)

## 2018-07-03 LAB — MRSA PCR SCREENING: MRSA by PCR: NEGATIVE

## 2018-07-03 LAB — MAGNESIUM: Magnesium: 1.8 mg/dL (ref 1.7–2.4)

## 2018-07-03 LAB — ETHANOL: Alcohol, Ethyl (B): <10 mg/dL (ref <10)

## 2018-07-03 LAB — CBG MONITORING, ED: Glucose-Capillary: 93 mg/dL (ref 70–99)

## 2018-07-03 MED ORDER — GADOBUTROL 1 MMOL/ML IV SOLN
10.0000 mL | Freq: Once | INTRAVENOUS | Status: AC | PRN
  Administered 2018-07-03: 10 mL via INTRAVENOUS

## 2018-07-03 MED ORDER — LEVETIRACETAM IN NACL 1000 MG/100ML IV SOLN
1000.0000 mg | Freq: Once | INTRAVENOUS | Status: AC
  Administered 2018-07-03: 1000 mg via INTRAVENOUS
  Filled 2018-07-03: qty 100

## 2018-07-03 MED ORDER — LORAZEPAM 2 MG/ML IJ SOLN
1.0000 mg | Freq: Once | INTRAMUSCULAR | Status: AC
  Administered 2018-07-03: 1 mg via INTRAVENOUS

## 2018-07-03 MED ORDER — LORAZEPAM 2 MG/ML IJ SOLN
2.0000 mg | INTRAMUSCULAR | Status: DC | PRN
  Administered 2018-07-04 – 2018-07-05 (×4): 2 mg via INTRAVENOUS
  Filled 2018-07-03 (×4): qty 1

## 2018-07-03 MED ORDER — ACETAMINOPHEN 325 MG PO TABS: 650.0000 mg | ORAL_TABLET | Freq: Four times a day (QID) | ORAL | Status: DC | PRN

## 2018-07-03 MED ORDER — SODIUM CHLORIDE 0.9 % IV BOLUS
1000.0000 mL | Freq: Once | INTRAVENOUS | Status: AC
  Administered 2018-07-03: 1000 mL via INTRAVENOUS

## 2018-07-03 MED ORDER — LEVETIRACETAM IN NACL 1000 MG/100ML IV SOLN: 1000.0000 mg | INTRAVENOUS | Status: DC

## 2018-07-03 MED ORDER — ONDANSETRON HCL 4 MG/2ML IJ SOLN
4.0000 mg | Freq: Four times a day (QID) | INTRAMUSCULAR | Status: DC | PRN
  Administered 2018-07-04 – 2018-07-05 (×2): 4 mg via INTRAVENOUS
  Filled 2018-07-03 (×2): qty 2

## 2018-07-03 MED ORDER — POLYETHYLENE GLYCOL 3350 17 G PO PACK: 17.0000 g | PACK | Freq: Every day | ORAL | Status: DC | PRN

## 2018-07-03 MED ORDER — ACETAMINOPHEN 650 MG RE SUPP: 650.0000 mg | Freq: Four times a day (QID) | RECTAL | Status: DC | PRN

## 2018-07-03 MED ORDER — MORPHINE SULFATE (PF) 4 MG/ML IV SOLN
4.0000 mg | Freq: Once | INTRAVENOUS | Status: AC
  Administered 2018-07-03: 4 mg via INTRAVENOUS
  Filled 2018-07-03: qty 1

## 2018-07-03 MED ORDER — LORAZEPAM 2 MG/ML IJ SOLN
INTRAMUSCULAR | Status: AC
  Filled 2018-07-03: qty 1

## 2018-07-03 MED ORDER — LORAZEPAM 2 MG/ML IJ SOLN
INTRAMUSCULAR | Status: AC
  Administered 2018-07-03: 2 mg via INTRAVENOUS
  Filled 2018-07-03: qty 1

## 2018-07-03 MED ORDER — ONDANSETRON HCL 4 MG PO TABS: 4.0000 mg | ORAL_TABLET | Freq: Four times a day (QID) | ORAL | Status: DC | PRN

## 2018-07-03 MED ORDER — ONDANSETRON HCL 4 MG/2ML IJ SOLN
4.0000 mg | Freq: Once | INTRAMUSCULAR | Status: AC
  Administered 2018-07-03: 4 mg via INTRAVENOUS
  Filled 2018-07-03: qty 2

## 2018-07-03 MED ORDER — LORAZEPAM 2 MG/ML IJ SOLN
2.0000 mg | Freq: Once | INTRAMUSCULAR | Status: AC
  Administered 2018-07-03: 2 mg via INTRAVENOUS

## 2018-07-03 MED ORDER — LEVETIRACETAM IN NACL 500 MG/100ML IV SOLN: 500.0000 mg | INTRAVENOUS | Status: DC

## 2018-07-03 MED ORDER — SODIUM CHLORIDE 0.9 % IV SOLN
INTRAVENOUS | Status: DC
  Administered 2018-07-03 – 2018-07-04 (×3): via INTRAVENOUS

## 2018-07-03 MED ORDER — OXYCODONE-ACETAMINOPHEN 5-325 MG PO TABS
1.0000 | ORAL_TABLET | Freq: Four times a day (QID) | ORAL | Status: DC | PRN
  Administered 2018-07-03 – 2018-07-05 (×3): 1 via ORAL
  Filled 2018-07-03 (×3): qty 1

## 2018-07-03 NOTE — H&P (Signed)
History and Physical    DOA: 07/03/2018  PCP: [facility]  Patient coming from: His primary urologist office  Chief Complaint: Seizure  HPI: Dale Bender is a 47 y.o. male with no significant past medical history other than GERD and ureteral stricture who was recently evaluated by his urologist for hematuria and scheduled for biopsy today presented after he had a tonic-clonic seizure episode at the office shortly after biopsy completed.  Patient had another generalized seizure episode in the ambulance en route. Patient apparently was awake and conversing on arrival here.  He had 3 more back-to-back seizure episodes while in the ED. He did regain consciousness after the first 2 episodes but required additional dose of Ativan with the third episode and currently somnolent.  Most of the history obtained by talking to wife bedside. According to the wife, patient has not complained of headache or nausea or vomiting or visual changes or weight loss recently.  She states he had severe dysuria associated with hematuria requiring indwelling Foley catheter for a week.  She was advised by his primary urologist that his prostrate appeared to be nodular on exam, hence biopsy was scheduled for today.  He has never had a seizure before.  No history of alcohol use or drug abuse.  No recent head trauma.   Review of Systems: As per HPI otherwise 10 point review of systems negative.    Past Medical History:  Diagnosis Date  . GERD (gastroesophageal reflux disease)   . 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 FRACTURE SURGERY     LT arm, LT leg  . URETHRAL STRICTURE DILATATION      Social history:  reports that he has quit smoking. He has never used smokeless tobacco. He reports that he drank alcohol. He reports that he has current or past drug history.  Works in a Clinical research associate.    No Known  Allergies  Family history: Father died in an accident.  Mother alive and has diabetes.  He has 3 brothers and 1 sister who are healthy   Prior to Admission medications   Medication Sig Start Date End Date Taking? Authorizing Provider  oxyCODONE-acetaminophen (PERCOCET/ROXICET) 5-325 MG tablet 1 or 2 tabs PO q8h prn pain Patient not taking: Reported on 07/03/2018 05/27/18   Francine Graven, DO    Physical Exam: Vitals:   07/03/18 1215 07/03/18 1230 07/03/18 1245 07/03/18 1315  BP: 131/85 (!) 136/93 (!) 145/96 130/86  Pulse: 86 83 84 76  Resp: 16 14 18 17   SpO2: 92% 95% 93% 98%  Weight:      Height:         Vitals:   07/03/18 1215 07/03/18 1230 07/03/18 1245 07/03/18 1315  BP: 131/85 (!) 136/93 (!) 145/96 130/86  Pulse: 86 83 84 76  Resp: 16 14 18 17   SpO2: 92% 95% 93% 98%  Weight:      Height:      General: Patient sedated and postictal from last seizure episode/medications.  No acute distress Eyes: Pupils constricted but reactive to light ENMT: Mucous membranes are moist. Posterior pharynx clear of any exudate or lesions.Normal dentition.  Neck: normal, supple, no masses, no thyromegaly Respiratory: clear to auscultation bilaterally, no wheezing, no crackles. Normal respiratory effort. No accessory muscle use.  Cardiovascular: Regular rate and rhythm, no murmurs / rubs / gallops. No extremity edema. 2+ pedal pulses. No carotid bruits.  Abdomen:  no tenderness, no masses palpated. No hepatosplenomegaly. Bowel sounds positive.  Musculoskeletal: no clubbing / cyanosis. No joint deformity upper and lower extremities.  Neurologic: Sedated/postictal, moving all extremities.  Could not assess further. Psychiatric: Could not assess due to his mental status SKIN/catheters: no rashes, lesions, ulcers. No induration  Labs on Admission: I have personally reviewed following labs and imaging studies  CBC: Recent Labs  Lab 07/03/18 1003  WBC 4.9  NEUTROABS 2.9  HGB 15.9  HCT  47.5  MCV 85.9  PLT 161   Basic Metabolic Panel: Recent Labs  Lab 07/03/18 1003  NA 137  K 4.5  CL 105  CO2 20*  GLUCOSE 109*  BUN 13  CREATININE 1.24  CALCIUM 9.0  MG 1.8   GFR: Estimated Creatinine Clearance: 94.8 mL/min (by C-G formula based on SCr of 1.24 mg/dL). Liver Function Tests: Recent Labs  Lab 07/03/18 1003  AST 36  ALT 37  ALKPHOS 59  BILITOT 2.1*  PROT 6.6  ALBUMIN 3.9   No results for input(s): LIPASE, AMYLASE in the last 168 hours. No results for input(s): AMMONIA in the last 168 hours. Coagulation Profile: Recent Labs  Lab 07/03/18 1003  INR 0.97   Cardiac Enzymes: No results for input(s): CKTOTAL, CKMB, CKMBINDEX, TROPONINI in the last 168 hours. BNP (last 3 results) No results for input(s): PROBNP in the last 8760 hours. HbA1C: No results for input(s): HGBA1C in the last 72 hours. CBG: Recent Labs  Lab 07/03/18 0958  GLUCAP 93   Lipid Profile: No results for input(s): CHOL, HDL, LDLCALC, TRIG, CHOLHDL, LDLDIRECT in the last 72 hours. Thyroid Function Tests: No results for input(s): TSH, T4TOTAL, FREET4, T3FREE, THYROIDAB in the last 72 hours. Anemia Panel: No results for input(s): VITAMINB12, FOLATE, FERRITIN, TIBC, IRON, RETICCTPCT in the last 72 hours. Urine analysis:    Component Value Date/Time   COLORURINE YELLOW 07/03/2018 1141   APPEARANCEUR CLEAR 07/03/2018 1141   LABSPEC 1.015 07/03/2018 1141   PHURINE 8.0 07/03/2018 1141   GLUCOSEU NEGATIVE 07/03/2018 1141   HGBUR MODERATE (A) 07/03/2018 1141   BILIRUBINUR NEGATIVE 07/03/2018 1141   KETONESUR NEGATIVE 07/03/2018 1141   PROTEINUR NEGATIVE 07/03/2018 1141   NITRITE NEGATIVE 07/03/2018 1141   LEUKOCYTESUR NEGATIVE 07/03/2018 1141    Radiological Exams on Admission: Ct Head Wo Contrast  Result Date: 07/03/2018 CLINICAL DATA:  Dizziness.  Seizure. EXAM: CT HEAD WITHOUT CONTRAST TECHNIQUE: Contiguous axial images were obtained from the base of the skull through the  vertex without intravenous contrast. COMPARISON:  None FINDINGS: Brain: The ventricles are normal in size and configuration. There is no intracranial mass, hemorrhage, extra-axial fluid collection, or midline shift. The brain parenchyma appears normal. There is no demonstrable acute infarct or edema. Vascular: No hyperdense vessel. There is no appreciable vascular calcification. Skull: The bony calvarium appears intact. Sinuses/Orbits: There is mucosal thickening in several ethmoid air cells. Other paranasal sinuses are clear. Orbits appear symmetric bilaterally. Other: Mastoid air cells are clear. There is air in the perisellar region. IMPRESSION: 1. Air in the perisellar region. This area appears to be in a vascular distribution. Question iatrogenic introduction of air. No adjacent fracture or sinus disruption. Clinical significance of this finding uncertain. 2. Mucosal thickening in several ethmoid air cells. Other paranasal sinuses are clear. 3.  Study otherwise unremarkable. Electronically Signed   By: Lowella Grip III M.D.   On: 07/03/2018 11:25   Dg Chest Portable 1 View  Result Date: 07/03/2018 CLINICAL DATA:  Seizure EXAM: PORTABLE  CHEST 1 VIEW COMPARISON:  January 18, 2015 FINDINGS: Lungs are clear. Heart size and pulmonary vascularity are normal. No adenopathy. No bone lesions. No pneumothorax. IMPRESSION: No edema or consolidation. Electronically Signed   By: Lowella Grip III M.D.   On: 07/03/2018 11:34    EKG: Independently reviewed.      Assessment and Plan:   1.  Multiple new onset seizures: Patient received Versed 2.5 mg by EMS and total of 3 mg of Ativan in the ED.  He also received a gram of Keppra.  CT head negative for any acute findings.  Will obtain MRI of the head to rule out focal mass/lesions.  Neurology consulted by ED physician.  IV fluids, PRN benzodiazepines and continue Keppra for now.  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  Patient's intake documentation indicates that he reported drinking alcohol though wife denies any history.  Will check alcohol level.?  Withdrawal seizure  2.  GERD: PPI  3.  Recent dysuria/hematuria: Urethral stricture versus BPH versus prostate cancer.  Underwent prostate biopsy today.  No active hematuria currently.  Monitor hemoglobin and for recurrence of symptoms.  No fever or white count to suggest infection.   DVT prophylaxis: SCDs and concern for recent hematuria  Code Status: Full code  Family Communication: Discussed with patient's wife (health care proxy) Consults called: Neurology Admission status:  Patient admitted as observation as anticipated LOS less than 2 midnights if MRI negative and no further seizures.   Guilford Shi MD [facility] Pager [phone]  If 7PM-7AM, please contact night-coverage [email or web address] Password [email or web address]  07/03/2018, 2:50 PM

## 2018-07-03 NOTE — ED Triage Notes (Signed)
Per EMS: Pt having a prostate biopsy at MD office and began c/o N/V and dizziness.  Staff reports pt then had a seizure lasting about 7 minutes.  No oral trauma nor incontinence noted. Upon EMS arrival, Pt A/O x4.  En route to ED pt had a second seizure lasting about 45 seconds.  EMS administered 2.5 of midazolam.  Pt A/O x4 upon arrival to ED.

## 2018-07-03 NOTE — ED Notes (Signed)
Patient transported to CT 

## 2018-07-03 NOTE — Consult Note (Signed)
NEURO HOSPITALIST CONSULT NOTE   Requestig physician: Dr. Gilford Raid  Reason for Consult: New onset seizure followed by status epilepticus  History obtained from:   Family and Chart    HPI:                                                                                                                                          Dale Bender is an 47 y.o. male who presents to the ED after a seizure occurring during a prostate biopsy, followed by recurrent seizures without return of normal sensorium between the episodes, consistent with status epilepticus. Symptoms of N/V and dizziness began prior to the first seizure, which lasted 7 minutes. On arrival by EMS, he was alert and oriented, but had a second seizure lasting about 45 seconds en route to the ED. He was given 2.5 mg of midazolam by EMS. A+Ox4 again on arrival to the ED but had additional seizures without full recovery of normal mentation interictally. He has no prior history of seizure.      Past Medical History:  Diagnosis Date  . GERD (gastroesophageal reflux disease)   . 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 FRACTURE SURGERY     LT arm, LT leg  . URETHRAL STRICTURE DILATATION      History reviewed. No pertinent family history.            Social History:  reports that he has quit smoking. He has never used smokeless tobacco. He reports that he drank alcohol. He reports that he has current or past drug history.  No Known Allergies  MEDICATIONS:                                                                                                                     On no home medications per patient's wife   ROS:  The patient denies headache or CP. Has abdominal pain. Also with pain at site of prostate biopsy. Endorses blurry vision.  Other ROS as per HPI.   Blood pressure 131/85, pulse 86, resp. rate 16, height 6\' 2"  (1.88 m), weight 106.6 kg, SpO2 92 %.   General Examination:                                                                                                       Physical Exam  HEENT-  [place]/AT    Lungs- Respirations unlabored Extremities- Warm and well perfused. No edema.  Skin-No rash to face or distal extremities.   Neurological Examination Mental Status: Postictal drowsiness noted. Increased latency of verbal responses with hypophonic, mildly slurred speech. No errors of grammar or syntax with limited speech output. Able to state the month and the day of the week. Able to identify his wife and state his name. Able to name a pen. Counted 6 fingers with 5 held up. Follows simple commands.  Cranial Nerves: II: On testing of visual fields, there is some variability of response, with temporal visual fields grossly intact. PERRL.  III,IV, VI: Able to fixate and track, with saccadic pursuits noted. No nystagmus. No unilateral ptosis.  V,VII: Face symmetric. Able to sense FT bilaterally  VIII: hearing intact to voice IX,X: Mild hypophonia XI: Symmetric XII: midline tongue extension Motor: Right : Upper extremity   4+/5    Left:     Upper extremity   4+/5  Lower extremity   4+/5    Lower extremity   4+/5 Has some difficulty with testing of BLE strength due to pain in lower back and at site of prostate biopsy.  Sensory: Light touch intact in upper and lower extremities without extinction.  Deep Tendon Reflexes: 2+ and symmetric throughout Plantars: Right: downgoing   Left: downgoing Cerebellar: Slow FNF without ataxia bilaterally  Gait: Deferred   Lab Results: Basic Metabolic Panel: Recent Labs  Lab 07/03/18 1003  NA 137  K 4.5  CL 105  CO2 20*  GLUCOSE 109*  BUN 13  CREATININE 1.24  CALCIUM 9.0  MG 1.8    CBC: Recent Labs  Lab 07/03/18 1003  WBC 4.9  NEUTROABS 2.9  HGB 15.9  HCT 47.5   MCV 85.9  PLT 179    Cardiac Enzymes: No results for input(s): CKTOTAL, CKMB, CKMBINDEX, TROPONINI in the last 168 hours.  Lipid Panel: No results for input(s): CHOL, TRIG, HDL, CHOLHDL, VLDL, LDLCALC in the last 168 hours.  Imaging: Ct Head Wo Contrast  Result Date: 07/03/2018 CLINICAL DATA:  Dizziness.  Seizure. EXAM: CT HEAD WITHOUT CONTRAST TECHNIQUE: Contiguous axial images were obtained from the base of the skull through the vertex without intravenous contrast. COMPARISON:  None FINDINGS: Brain: The ventricles are normal in size and configuration. There is no intracranial mass, hemorrhage, extra-axial fluid collection, or midline shift. The brain parenchyma appears normal. There is no demonstrable acute infarct or edema. Vascular: No hyperdense vessel. There is no appreciable vascular calcification. Skull: The bony calvarium appears intact. Sinuses/Orbits: There is mucosal  thickening in several ethmoid air cells. Other paranasal sinuses are clear. Orbits appear symmetric bilaterally. Other: Mastoid air cells are clear. There is air in the perisellar region. IMPRESSION: 1. Air in the perisellar region. This area appears to be in a vascular distribution. Question iatrogenic introduction of air. No adjacent fracture or sinus disruption. Clinical significance of this finding uncertain. 2. Mucosal thickening in several ethmoid air cells. Other paranasal sinuses are clear. 3.  Study otherwise unremarkable. Electronically Signed   By: Lowella Grip III M.D.   On: 07/03/2018 11:25   Dg Chest Portable 1 View  Result Date: 07/03/2018 CLINICAL DATA:  Seizure EXAM: PORTABLE CHEST 1 VIEW COMPARISON:  January 18, 2015 FINDINGS: Lungs are clear. Heart size and pulmonary vascularity are normal. No adenopathy. No bone lesions. No pneumothorax. IMPRESSION: No edema or consolidation. Electronically Signed   By: Lowella Grip III M.D.   On: 07/03/2018 11:34    Assessment: 47 year old male with new  onset seizure during prostate bx followed by status epilepticus. He had recurrent seizures without return to baseline interictally. No further seizures after treatment with with IV Ativan and Keppra load, but still postictal. MRI and EEG to evaluate.  1. Was on Levaquin for the procedure, but I have found no literature to support a likely relationship between this antibiotic and lowering of seizure threshold.  2. Searched literature for possible relationship between rectal stimulation and seizure, and could find no references.  3. Regarding possible intrinsic predisposing factors, he has no prior history of seizures and no history of brain trauma.  4. Given what is clearly a postictal state and semiology described by EDP and patient's wife, the events were most consistent with epileptic seizures. Pseudoseizure is felt to be unlikely  5. Reviewed Pubmed with term "pain induced seizure" and could find no references.  6. CT head images were reviewed. No acute abnormalities involving the brain parenchyma are seen.   Recommendations: 1. Continue Keppra at 500 mg po BID. Will need to continue for 6 months as outpatient and if seizure free, outpatient tapering protocol may be considered.  2. EEG 3. MRI brain with contrast.  4. Inpatient seizure precautions 5. Outpatient seizure precautions: Per [facility] statutes, patients with seizures are not allowed to drive until  they have been seizure-free for six months. Use caution when using heavy equipment or power tools. Avoid working on ladders or at heights. Take showers instead of baths. Ensure the water temperature is not too high on the home water heater. Do not go swimming alone. When caring for infants or small children, sit down when holding, feeding, or changing them to minimize risk of injury to the child in the event you have a seizure. Also, Maintain good sleep hygiene. Avoid alcohol. 6. Will need outpatient Neurology follow up.      Electronically signed: Dr. Kerney Elbe 07/03/2018, 1:17 PM

## 2018-07-03 NOTE — ED Provider Notes (Signed)
[facility] EMERGENCY DEPARTMENT Provider Note   CSN: [id number] Arrival date & time: 07/03/18  9767     History   Chief Complaint Chief Complaint  Patient presents with  . Seizures    HPI Dale Bender is a 47 y.o. male.  Pt presents to the ED today with seizures.  The pt was having a prostate biopsy today at the urology office.  While they were doing it, he began having n/v and dizziness.  The pt then had a 7 minute seizure.  The pt did not have oral trauma or incontinence.  When EMS arrived, he was awake and alert, but had another seizure en route.  EMS reports that it lasted about 45 seconds.  He was given 2.5 mg Versed en route.  He is awake and alert now, complaining of abdominal pain.  His wife said he was given an antibiotic prior to procedure and they were able to complete the biopsy.     Past Medical History:  Diagnosis Date  . GERD (gastroesophageal reflux disease)   . 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   Past Surgical History:  Procedure Laterality Date  . FRACTURE SURGERY     LT arm, LT leg  . URETHRAL STRICTURE DILATATION          Home Medications    Prior to Admission medications   Medication Sig Start Date End Date Taking? Authorizing Provider  oxyCODONE-acetaminophen (PERCOCET/ROXICET) 5-325 MG tablet 1 or 2 tabs PO q8h prn pain Patient not taking: Reported on 07/03/2018 05/27/18   Francine Graven, DO    Family History History reviewed. No pertinent family history.  Social History Social History   Tobacco Use  . Smoking status: Former Research scientist (life sciences)  . Smokeless tobacco: Never Used  Substance Use Topics  . Alcohol use: Not Currently  . Drug use: Not Currently     Allergies   Patient has no known allergies.   Review of Systems Review of Systems  Gastrointestinal: Positive for abdominal pain.  Neurological: Positive for seizures.  All other systems reviewed and  are negative.    Physical Exam Updated Vital Signs BP 131/85   Pulse 86   Resp 16   Ht 6\' 2"  (1.88 m)   Wt 106.6 kg   SpO2 92%   BMI 30.17 kg/m   Physical Exam  Constitutional: He is oriented to person, place, and time. He appears well-developed and well-nourished.  HENT:  Head: Normocephalic and atraumatic.  Right Ear: External ear normal.  Left Ear: External ear normal.  Nose: Nose normal.  Mouth/Throat: Oropharynx is clear and moist.  Eyes: Pupils are equal, round, and reactive to light. Conjunctivae and EOM are normal.  Neck: Normal range of motion. Neck supple.  Cardiovascular: Normal rate, regular rhythm, normal heart sounds and intact distal pulses.  Pulmonary/Chest: Effort normal and breath sounds normal.  Abdominal: Soft. Bowel sounds are normal. There is generalized tenderness.  Musculoskeletal: Normal range of motion.  Neurological: He is alert and oriented to person, place, and time.  Skin: Skin is warm. Capillary refill takes less than 2 seconds.  Psychiatric: He has a normal mood and affect. His behavior is normal. Judgment and thought content normal.  Nursing note and vitals reviewed.    ED Treatments / Results  Labs (all labs ordered are listed, but only abnormal results are displayed) Labs Reviewed  COMPREHENSIVE METABOLIC PANEL - Abnormal; Notable for the following  components:      Result Value   CO2 20 (*)    Glucose, Bld 109 (*)    Total Bilirubin 2.1 (*)    All other components within normal limits  URINALYSIS, ROUTINE W REFLEX MICROSCOPIC - Abnormal; Notable for the following components:   Hgb urine dipstick MODERATE (*)    RBC / HPF >50 (*)    All other components within normal limits  RAPID URINE DRUG SCREEN, HOSP PERFORMED - Abnormal; Notable for the following components:   Opiates POSITIVE (*)    Benzodiazepines POSITIVE (*)    All other components within normal limits  CBC WITH DIFFERENTIAL/PLATELET  MAGNESIUM  PROTIME-INR  CBG  MONITORING, ED    EKG EKG Interpretation  Date/Time:  Friday July 03 2018 09:54:44 EST Ventricular Rate:  78 PR Interval:    QRS Duration: 85 QT Interval:  353 QTC Calculation: 402 R Axis:   46 Text Interpretation:  Sinus rhythm No significant change since last tracing Confirmed by Isla Pence [phone]) on 07/03/2018 9:57:59 AM   Radiology Ct Head Wo Contrast  Result Date: 07/03/2018 CLINICAL DATA:  Dizziness.  Seizure. EXAM: CT HEAD WITHOUT CONTRAST TECHNIQUE: Contiguous axial images were obtained from the base of the skull through the vertex without intravenous contrast. COMPARISON:  None FINDINGS: Brain: The ventricles are normal in size and configuration. There is no intracranial mass, hemorrhage, extra-axial fluid collection, or midline shift. The brain parenchyma appears normal. There is no demonstrable acute infarct or edema. Vascular: No hyperdense vessel. There is no appreciable vascular calcification. Skull: The bony calvarium appears intact. Sinuses/Orbits: There is mucosal thickening in several ethmoid air cells. Other paranasal sinuses are clear. Orbits appear symmetric bilaterally. Other: Mastoid air cells are clear. There is air in the perisellar region. IMPRESSION: 1. Air in the perisellar region. This area appears to be in a vascular distribution. Question iatrogenic introduction of air. No adjacent fracture or sinus disruption. Clinical significance of this finding uncertain. 2. Mucosal thickening in several ethmoid air cells. Other paranasal sinuses are clear. 3.  Study otherwise unremarkable. Electronically Signed   By: Lowella Grip III M.D.   On: 07/03/2018 11:25   Dg Chest Portable 1 View  Result Date: 07/03/2018 CLINICAL DATA:  Seizure EXAM: PORTABLE CHEST 1 VIEW COMPARISON:  January 18, 2015 FINDINGS: Lungs are clear. Heart size and pulmonary vascularity are normal. No adenopathy. No bone lesions. No pneumothorax. IMPRESSION: No edema or consolidation.  Electronically Signed   By: Lowella Grip III M.D.   On: 07/03/2018 11:34    Procedures Procedures (including critical care time)  Medications Ordered in ED Medications  sodium chloride 0.9 % bolus 1,000 mL (0 mLs Intravenous Stopped 07/03/18 1136)    And  0.9 %  sodium chloride infusion ( Intravenous New Bag/Given 07/03/18 1116)  LORazepam (ATIVAN) injection 1 mg (1 mg Intravenous Given 07/03/18 1024)  morphine 4 MG/ML injection 4 mg (4 mg Intravenous Given 07/03/18 1136)  ondansetron (ZOFRAN) injection 4 mg (4 mg Intravenous Given 07/03/18 1136)  levETIRAcetam (KEPPRA) IVPB 1000 mg/100 mL premix (0 mg Intravenous Stopped 07/03/18 1111)  LORazepam (ATIVAN) injection 2 mg (2 mg Intravenous Given 07/03/18 1111)     Initial Impression / Assessment and Plan / ED Course  I have reviewed the triage vital signs and the nursing notes.  Pertinent labs & imaging results that were available during my care of the patient were reviewed by me and considered in my medical decision making (see chart for details).  Since pt's arrival, he has had several seizures.  He was given multiple doses of ativan (3 mg total) and started on keppra 1000 mg bolus.  The pt has had no more seizures since he had the last dose of ativan.  He is now asleep.    I have held any additional work up for the abdominal pain as he is comfortable and resting.  Both times, he went for xray/ct, he had another seizure.  He was d/w Dr. Cheral Marker (neurology) who will see him in consult.  Pt d/w Dr. Earnest Conroy ([facility]) for admission.  CRITICAL CARE Performed by: Isla Pence   Total critical care time: 30 minutes  Critical care time was exclusive of separately billable procedures and treating other patients.  Critical care was necessary to treat or prevent imminent or life-threatening deterioration.  Critical care was time spent personally by me on the following activities: development of treatment plan with patient  and/or surrogate as well as nursing, discussions with consultants, evaluation of patient's response to treatment, examination of patient, obtaining history from patient or surrogate, ordering and performing treatments and interventions, ordering and review of laboratory studies, ordering and review of radiographic studies, pulse oximetry and re-evaluation of patient's condition. Final Clinical Impressions(s) / ED Diagnoses   Final diagnoses:  Status epilepticus ([place])  Hx of prostate biopsy  Generalized abdominal pain    ED Discharge Orders    None       Isla Pence, MD 07/03/18 1319

## 2018-07-04 ENCOUNTER — Observation Stay (HOSPITAL_COMMUNITY): Payer: BLUE CROSS/BLUE SHIELD

## 2018-07-04 DIAGNOSIS — G40901 Epilepsy, unspecified, not intractable, with status epilepticus: Secondary | ICD-10-CM | POA: Diagnosis not present

## 2018-07-04 DIAGNOSIS — R319 Hematuria, unspecified: Secondary | ICD-10-CM | POA: Diagnosis present

## 2018-07-04 DIAGNOSIS — Z9889 Other specified postprocedural states: Secondary | ICD-10-CM | POA: Diagnosis not present

## 2018-07-04 DIAGNOSIS — K219 Gastro-esophageal reflux disease without esophagitis: Secondary | ICD-10-CM | POA: Diagnosis present

## 2018-07-04 DIAGNOSIS — R1084 Generalized abdominal pain: Secondary | ICD-10-CM | POA: Diagnosis not present

## 2018-07-04 DIAGNOSIS — R569 Unspecified convulsions: Secondary | ICD-10-CM | POA: Diagnosis not present

## 2018-07-04 LAB — HIV ANTIBODY (ROUTINE TESTING W REFLEX): HIV Screen 4th Generation wRfx: NONREACTIVE

## 2018-07-04 MED ORDER — LEVETIRACETAM IN NACL 1000 MG/100ML IV SOLN
1000.0000 mg | Freq: Once | INTRAVENOUS | Status: AC
  Administered 2018-07-04: 1000 mg via INTRAVENOUS
  Filled 2018-07-04: qty 100

## 2018-07-04 MED ORDER — LEVETIRACETAM 500 MG PO TABS
500.0000 mg | ORAL_TABLET | Freq: Two times a day (BID) | ORAL | Status: DC
  Administered 2018-07-04 – 2018-07-05 (×2): 500 mg via ORAL
  Filled 2018-07-04 (×2): qty 1

## 2018-07-04 MED ORDER — ALUM & MAG HYDROXIDE-SIMETH 200-200-20 MG/5ML PO SUSP
30.0000 mL | Freq: Two times a day (BID) | ORAL | Status: DC | PRN
  Administered 2018-07-04 (×2): 30 mL via ORAL
  Filled 2018-07-04 (×2): qty 30

## 2018-07-04 MED ORDER — SODIUM CHLORIDE 0.9 % IV SOLN
INTRAVENOUS | Status: AC
  Administered 2018-07-05: 03:00:00 via INTRAVENOUS

## 2018-07-04 NOTE — Progress Notes (Signed)
Per RN, the patient had another seizure-like spell with whole-body shaking, followed by drowsy state. Keppra was ordered qd instead of BID and order has now been changed to 500 mg BID. Also administering a supplemental load of Keppra at 1000 mg IV x 1. EEG technician has been called.   Electronically signed: Dr. Kerney Elbe

## 2018-07-04 NOTE — Progress Notes (Signed)
No acute abnormality seen on MRI. EEG has been ordered and is pending.   Electronically signed: Dr. Kerney Elbe

## 2018-07-04 NOTE — Procedures (Signed)
EEG Report  Clinical History:  Patient had a GTCS after prostate biopsy.  He had a second GTCS in the ambulance and 3 more GTCS in the ER.  No prior history.  Technical Summary:  A 19 channel digital EEG recording was performed using the 10-20 international system of electrode placement.  Bipolar and Referential montages were used.  The total recording time was approx 20 minutes.  Findings:  There is a posterior dominant rhythm of 12 Hz reactive to eye opening and closure.  No focal slowing is present.  He gets intermittently drowsy, but sleep was not recorded.  There is no focal slowing.  There are no epileptiform discharges or electrographic seizures present.    Impression:  This is a normal EEG in the awake and drowsy states. Although there is no evidence of a seizure tendency on this recording, this does not rule it out.  If clinical suspicion remains high, then a sleep deprived EEG and/or prolonged ambulatory EEG may be of additional value.       Rogue Jury, MS, MD

## 2018-07-04 NOTE — Progress Notes (Addendum)
PROGRESS NOTE                                                                                                                                                                                                             Patient Demographics:    Dale Bender, is a 47 y.o. male, DOB - 21-Jun-1971, UJW:[id number]  Admit date - 07/03/2018   Admitting Physician Guilford Shi, MD  Outpatient Primary MD for the patient is [facility]  LOS - 0  Chief Complaint  Patient presents with  . Seizures       Brief Narrative  Dale Bender is a 47 y.o. male with no significant past medical history other than GERD and ureteral stricture who was recently evaluated by his urologist for hematuria and scheduled for biopsy today presented after he had a tonic-clonic seizure episode at the office shortly after biopsy completed.  Patient had another generalized seizure episode in the ambulance en route.   Subjective:    Dale Bender today has, No headache, No chest pain, No abdominal pain - No Nausea, No new weakness tingling or numbness, No Cough - SOB.     Assessment  & Plan :     1. Seizures - likely Levaquin side effect, MRI stable, EEG pending, last seizure few hours ago, continue seizure precautions, Keppra with Ativan PRN, if seizure free for 24hrs will DC home, DW Neuro.  2. GERD - PPI  3.  Recent history of dysuria and hematuria.  Recent prostate biopsy.  Follow with urology post discharge.    Family Communication  :  Family bedside  Code Status :  Full  Disposition Plan  :  Home wednesday  Consults  :   Neuro  Procedures  :    MRI - non acute  EEG -   DVT Prophylaxis  :    Heparin    Lab Results  Component Value Date   PLT 179 07/03/2018    Diet :  Diet Order            Diet regular Room service appropriate? Yes; Fluid consistency: Thin  Diet effective now               Inpatient Medications Scheduled Meds: . levETIRAcetam  500 mg  Oral BID   Continuous Infusions: . sodium chloride 125 mL/hr at 07/04/18 0500   PRN Meds:.acetaminophen **OR** acetaminophen, alum & mag hydroxide-simeth, LORazepam, ondansetron **OR** ondansetron (ZOFRAN) IV, oxyCODONE-acetaminophen, polyethylene glycol  Antibiotics  :  Anti-infectives (From admission, onward)   None        Objective:   Vitals:   07/03/18 2016 07/04/18 0015 07/04/18 0416 07/04/18 1020  BP: 99/64 108/76 116/78 128/78  Pulse: 63 64 61 80  Resp: 18 13 12 17   Temp: 97.7 F (36.5 C)  97.7 F (36.5 C) 98.2 F (36.8 C)  TempSrc: Oral  Oral Oral  SpO2: 99% 98% 97% 98%  Weight:      Height:        Wt Readings from Last 3 Encounters:  07/03/18 106.6 kg  05/27/18 106.6 kg     Intake/Output Summary (Last 24 hours) at 07/04/2018 1236 Last data filed at 07/04/2018 0500 Gross per 24 hour  Intake 1909.57 ml  Output -  Net 1909.57 ml     Physical Exam  Awake Alert, Oriented X 3, No new F.N deficits, Normal affect Dale Bender,PERRAL Supple Neck,No JVD, No cervical lymphadenopathy appriciated.  Symmetrical Chest wall movement, Good air movement bilaterally, CTAB RRR,No Gallops,Rubs or new Murmurs, No Parasternal Heave +ve B.Sounds, Abd Soft, No tenderness, No organomegaly appriciated, No rebound - guarding or rigidity. No Cyanosis, Clubbing or edema, No new Rash or bruise       Data Review:    CBC Recent Labs  Lab 07/03/18 1003  WBC 4.9  HGB 15.9  HCT 47.5  PLT 179  MCV 85.9  MCH 28.8  MCHC 33.5  RDW 12.0  LYMPHSABS 1.6  MONOABS 0.4  EOSABS 0.0  BASOSABS 0.0    Chemistries  Recent Labs  Lab 07/03/18 1003  NA 137  K 4.5  CL 105  CO2 20*  GLUCOSE 109*  BUN 13  CREATININE 1.24  CALCIUM 9.0  MG 1.8  AST 36  ALT 37  ALKPHOS 59  BILITOT 2.1*   ------------------------------------------------------------------------------------------------------------------ No results for input(s): CHOL, HDL, LDLCALC, TRIG, CHOLHDL, LDLDIRECT in the  last 72 hours.  No results found for: HGBA1C ------------------------------------------------------------------------------------------------------------------ No results for input(s): TSH, T4TOTAL, T3FREE, THYROIDAB in the last 72 hours.  Invalid input(s): FREET3 ------------------------------------------------------------------------------------------------------------------ No results for input(s): VITAMINB12, FOLATE, FERRITIN, TIBC, IRON, RETICCTPCT in the last 72 hours.  Coagulation profile Recent Labs  Lab 07/03/18 1003  INR 0.97    No results for input(s): DDIMER in the last 72 hours.  Cardiac Enzymes No results for input(s): CKMB, TROPONINI, MYOGLOBIN in the last 168 hours.  Invalid input(s): CK ------------------------------------------------------------------------------------------------------------------ No results found for: BNP  Micro Results Recent Results (from the past 240 hour(s))  MRSA PCR Screening     Status: None   Collection Time: 07/03/18  8:56 PM  Result Value Ref Range Status   MRSA by PCR NEGATIVE NEGATIVE Final    Comment:        The GeneXpert MRSA Assay (FDA approved for NASAL specimens only), is one component of a comprehensive MRSA colonization surveillance program. It is not intended to diagnose MRSA infection nor to guide or monitor treatment for MRSA infections. Performed at [facility], [place] [address], [place], [place] [address]     Radiology Reports Ct Head Wo Contrast  Result Date: 07/03/2018 CLINICAL DATA:  Dizziness.  Seizure. EXAM: CT HEAD WITHOUT CONTRAST TECHNIQUE: Contiguous axial images were obtained from the base of the skull through the vertex without intravenous contrast. COMPARISON:  None FINDINGS: Brain: The ventricles are normal in size and configuration. There is no intracranial mass, hemorrhage, extra-axial fluid collection, or midline shift. The brain parenchyma appears normal. There is no demonstrable acute  infarct  or edema. Vascular: No hyperdense vessel. There is no appreciable vascular calcification. Skull: The bony calvarium appears intact. Sinuses/Orbits: There is mucosal thickening in several ethmoid air cells. Other paranasal sinuses are clear. Orbits appear symmetric bilaterally. Other: Mastoid air cells are clear. There is air in the perisellar region. IMPRESSION: 1. Air in the perisellar region. This area appears to be in a vascular distribution. Question iatrogenic introduction of air. No adjacent fracture or sinus disruption. Clinical significance of this finding uncertain. 2. Mucosal thickening in several ethmoid air cells. Other paranasal sinuses are clear. 3.  Study otherwise unremarkable. Electronically Signed   By: Lowella Grip III M.D.   On: 07/03/2018 11:25   Mr Jeri Cos PT Contrast  Result Date: 07/03/2018 CLINICAL DATA:  Seizures today during and after a prostate biopsy. EXAM: MRI HEAD WITHOUT AND WITH CONTRAST TECHNIQUE: Multiplanar, multiecho pulse sequences of the brain and surrounding structures were obtained without and with intravenous contrast. CONTRAST:  10 mL Gadavist COMPARISON:  Head CT 07/03/2018 FINDINGS: The study is motion degraded throughout with some sequences (including dedicated temporal lobe imaging) moderately to severely motion degraded. Brain: There is no evidence of acute infarct, intracranial hemorrhage, mass, midline shift, or extra-axial fluid collection. The ventricles and sulci are normal. The brain is grossly normal in signal without definite abnormal enhancement identified within limitations of motion artifact. Detailed assessment of the mesial temporal lobes is precluded by motion artifact. Vascular: Major intracranial vascular flow voids are preserved. Skull and upper cervical spine: No suspicious calvarial lesion. Nonspecific diffusely diminished bone marrow T1 signal intensity in the upper cervical spine. Sinuses/Orbits: Unremarkable orbits. Paranasal  sinuses and mastoid air cells are clear. Other: None. IMPRESSION: Motion degraded examination without acute intracranial abnormality identified. Electronically Signed   By: Logan Bores M.D.   On: 07/03/2018 15:23   Dg Chest Portable 1 View  Result Date: 07/03/2018 CLINICAL DATA:  Seizure EXAM: PORTABLE CHEST 1 VIEW COMPARISON:  January 18, 2015 FINDINGS: Lungs are clear. Heart size and pulmonary vascularity are normal. No adenopathy. No bone lesions. No pneumothorax. IMPRESSION: No edema or consolidation. Electronically Signed   By: Lowella Grip III M.D.   On: 07/03/2018 11:34    Time Spent in minutes  30   Lala Lund M.D on 07/04/2018 at 12:36 PM  To page go to [email or web address] - password [facility]

## 2018-07-04 NOTE — Progress Notes (Signed)
EEG complete - results pending 

## 2018-07-04 NOTE — Progress Notes (Signed)
Patient had 2 episodes of seizure-like activity this shift, one at 0805 and another at 1518. Ativan given both times and convulsions stopped almost immediately both times.

## 2018-07-05 DIAGNOSIS — R569 Unspecified convulsions: Secondary | ICD-10-CM

## 2018-07-05 DIAGNOSIS — Z9889 Other specified postprocedural states: Secondary | ICD-10-CM

## 2018-07-05 DIAGNOSIS — R1084 Generalized abdominal pain: Secondary | ICD-10-CM

## 2018-07-05 MED ORDER — LEVETIRACETAM 500 MG PO TABS: 500.0000 mg | ORAL_TABLET | Freq: Two times a day (BID) | ORAL | 0 refills | Status: DC

## 2018-07-05 NOTE — Progress Notes (Signed)
Patient was having seizure-like activity this morning around 0735, Ativan given. Occurred again in the late morning. Both times MD walked in and gave painful stimuli and patient said "ouch" and was able to follow commands afterward.

## 2018-07-05 NOTE — Progress Notes (Signed)
     Dale Bender was admitted to the Hospital on 07/03/2018 and Discharged  07/05/2018 and should be excused from work/school   for 3 days starting from date -  07/03/2018 , may return to work/school with below given restrictions, follow with Neurology in 1 week and get restrictions readdressed .  Restrictions  Do not drive, operate heavy machinery, perform activities at heights, swimming or participation in water activities or provide baby sitting services until you have seen by Primary MD or a Neurologist and advised to do so again.   Call Lala Lund MD, [facility]  [phone] with questions.  Lala Lund M.D on 07/05/2018,at 2:07 PM  [facility]   Office  [phone]

## 2018-07-05 NOTE — Progress Notes (Addendum)
Patient had seizure activity with whole body shaking lasting approximately 3 minutes, 35 seconds. Ativan 2 mg given IV per order, seizure subsided. Patient very drowsy at this time. VSS. Wife remains at bedside. RN will continue to monitor patient and make MD aware of any changes.  P.J. Linus Mako, RN

## 2018-07-05 NOTE — Progress Notes (Signed)
Dale Bender to be D/C'd home per MD order. Discussed with the patient and wife and all questions fully answered. VVS, Skin clean, dry and intact without evidence of skin break down, no evidence of skin tears noted.  IV catheter discontinued intact. Site without signs and symptoms of complications. Dressing and pressure applied.  An After Visit Summary and prescription was printed and given to the patient.  Patient escorted via [place], and D/C home via private auto.  Melonie Florida  07/05/2018 3:04 PM

## 2018-07-05 NOTE — Discharge Summary (Addendum)
Dale Bender QIO:[id number] DOB: August 29, 1970 DOA: 07/03/2018  PCP: [facility]  Admit date: 07/03/2018  Discharge date: 07/05/2018  Admitted From: Home  Disposition:  Home   Recommendations for Outpatient Follow-up:   Follow up with PCP in 1-2 weeks  PCP Please obtain BMP/CBC, 2 view CXR in 1week,  (see Discharge instructions)   PCP Please follow up on the following pending results: None   Home Health: None   Equipment/Devices: None  Consultations: Neuro Discharge Condition: Stable   CODE STATUS: Full   Diet Recommendation: Heart Healthy     Chief Complaint  Patient presents with  . Seizures     Brief history of present illness from the day of admission and additional interim summary    Dale Hamptonis a 47 y.o.malewithno significant past medical history other than GERD and ureteral stricture who was recently evaluated by his urologist for hematuria and scheduled for biopsy today presented after he had a tonic-clonic seizure episode at the office shortly after biopsy completed. Patient had another generalized seizure episode in the ambulance enroute.                                                                 Hospital Course    1. 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  This morning at 8 AM he was surrounded by his nurse and charge nurse and apparently was having a seizure episode, I walked into the room and gave him a painful stimuli after which instantly his activity ceased, again at 12 PM he had a similar episode which again ceased with painful stimuli, talked and followed all  commands right away.  He was counseled.  His case was discussed with neurologist Dr. Cheral Marker.  Findings would point towards pseudoseizure however initial episode could have been a seizure but it was not witnessed by me, he will get Keppra, driving restrictions provided in writing, outpatient neurology and PCP follow-up.  He denies any stress in his life.  2. GERD - PPI  3.  Recent history of dysuria and hematuria.  Recent prostate biopsy.  Follow with urology post discharge.   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  Follow with Primary MD [facility] in 7 days   Get CBC, CMP, 2 view Chest X ray -  checked  by Primary MD  in 5-7 days    Activity: As tolerated with Full fall precautions use walker/cane & assistance as needed  Disposition Home    Diet: Heart Healthy    Special Instructions: If you have smoked or chewed Tobacco  in the last 2 yrs please stop smoking, stop any regular Alcohol  and or any Recreational drug use.  On your next visit with your primary care physician please Get Medicines reviewed and adjusted.  Please request your Prim.MD to go over all Hospital Tests and Procedure/Radiological results at the follow up, please get all Hospital records sent to your Prim MD by signing hospital release before you go home.  If you experience worsening of your admission symptoms, develop shortness of breath, life threatening emergency, suicidal or homicidal thoughts  you must seek medical attention immediately by calling 911 or calling your MD immediately  if symptoms less severe.  You Must read complete instructions/literature along with all the possible adverse reactions/side effects for all the Medicines you take and that have been prescribed to you. Take any new Medicines after you have completely understood and accpet all the possible adverse reactions/side effects.   Increase activity slowly   Complete by:  As directed       Discharge Medications   Allergies as of 07/05/2018   No Known Allergies     Medication List    TAKE these medications   levETIRAcetam 500 MG tablet Commonly known as:  KEPPRA Take 1 tablet (500 mg total) by mouth 2 (two) times daily.   oxyCODONE-acetaminophen 5-325 MG tablet Commonly known as:  PERCOCET/ROXICET 1 or 2 tabs PO q8h prn pain       Follow-up Information    [facility]. Schedule an appointment as soon as possible for a visit in 1 week(s).   Contact information: [place] [address] [phone]        [place]. Schedule an appointment as soon as possible for a visit in 1 week(s).   Why:  seizures Contact information: [address]     [place] [address] [phone]          Major procedures and Radiology Reports - PLEASE review detailed and final reports thoroughly  -     EEG -  This is a normal EEG in the awake and drowsy states. Although there is no evidence of a seizure tendency on this recording, this does not rule it out.  If clinical suspicion remains high, then a sleep deprived EEG and/or prolonged ambulatory EEG may be of additional value.        Ct Head Wo Contrast  Result Date: 07/03/2018 CLINICAL DATA:  Dizziness.  Seizure. EXAM: CT HEAD WITHOUT CONTRAST TECHNIQUE: Contiguous axial images were obtained from the base of the skull through the vertex without intravenous contrast. COMPARISON:  None  FINDINGS: Brain: The ventricles are normal in size and configuration. There is no intracranial mass, hemorrhage, extra-axial fluid collection, or midline shift. The brain parenchyma appears normal. There is no demonstrable acute infarct or edema. Vascular: No hyperdense vessel. There is no appreciable vascular calcification. Skull: The bony calvarium appears intact. Sinuses/Orbits: There is mucosal thickening in several ethmoid air cells.  Other paranasal sinuses are clear. Orbits appear symmetric bilaterally. Other: Mastoid air cells are clear. There is air in the perisellar region. IMPRESSION: 1. Air in the perisellar region. This area appears to be in a vascular distribution. Question iatrogenic introduction of air. No adjacent fracture or sinus disruption. Clinical significance of this finding uncertain. 2. Mucosal thickening in several ethmoid air cells. Other paranasal sinuses are clear. 3.  Study otherwise unremarkable. Electronically Signed   By: Lowella Grip III M.D.   On: 07/03/2018 11:25   Mr Jeri Cos MW Contrast  Result Date: 07/03/2018 CLINICAL DATA:  Seizures today during and after a prostate biopsy. EXAM: MRI HEAD WITHOUT AND WITH CONTRAST TECHNIQUE: Multiplanar, multiecho pulse sequences of the brain and surrounding structures were obtained without and with intravenous contrast. CONTRAST:  10 mL Gadavist COMPARISON:  Head CT 07/03/2018 FINDINGS: The study is motion degraded throughout with some sequences (including dedicated temporal lobe imaging) moderately to severely motion degraded. Brain: There is no evidence of acute infarct, intracranial hemorrhage, mass, midline shift, or extra-axial fluid collection. The ventricles and sulci are normal. The brain is grossly normal in signal without definite abnormal enhancement identified within limitations of motion artifact. Detailed assessment of the mesial temporal lobes is precluded by motion artifact. Vascular: Major intracranial vascular flow  voids are preserved. Skull and upper cervical spine: No suspicious calvarial lesion. Nonspecific diffusely diminished bone marrow T1 signal intensity in the upper cervical spine. Sinuses/Orbits: Unremarkable orbits. Paranasal sinuses and mastoid air cells are clear. Other: None. IMPRESSION: Motion degraded examination without acute intracranial abnormality identified. Electronically Signed   By: Logan Bores M.D.   On: 07/03/2018 15:23   Dg Chest Portable 1 View  Result Date: 07/03/2018 CLINICAL DATA:  Seizure EXAM: PORTABLE CHEST 1 VIEW COMPARISON:  January 18, 2015 FINDINGS: Lungs are clear. Heart size and pulmonary vascularity are normal. No adenopathy. No bone lesions. No pneumothorax. IMPRESSION: No edema or consolidation. Electronically Signed   By: Lowella Grip III M.D.   On: 07/03/2018 11:34    Micro Results     Recent Results (from the past 240 hour(s))  MRSA PCR Screening     Status: None   Collection Time: 07/03/18  8:56 PM  Result Value Ref Range Status   MRSA by PCR NEGATIVE NEGATIVE Final    Comment:        The GeneXpert MRSA Assay (FDA approved for NASAL specimens only), is one component of a comprehensive MRSA colonization surveillance program. It is not intended to diagnose MRSA infection nor to guide or monitor treatment for MRSA infections. Performed at [facility], [place] [address], [place], [place] [address]     Today   Subjective    Belen Zwahlen today has no headache,no chest abdominal pain,no new weakness tingling or numbness, feels much better wants to go home today.     Objective   Blood pressure (!) 102/59, pulse 68, temperature 98.1 F (36.7 C), temperature source Oral, resp. rate 13, height 6\' 2"  (1.88 m), weight 106.6 kg, SpO2 98 %.   Intake/Output Summary (Last 24 hours) at 07/05/2018 0919 Last data filed at 07/05/2018 0437 Gross per 24 hour  Intake -  Output 3100 ml  Net -3100 ml    Exam Awake Alert, Oriented x 3, No new F.N  deficits, Normal affect [place].AT,PERRAL Supple Neck,No JVD, No cervical lymphadenopathy appriciated.  Symmetrical Chest wall movement, Good air movement bilaterally, CTAB RRR,No Gallops,Rubs or new Murmurs, No Parasternal Heave +ve B.Sounds, Abd  Soft, Non tender, No organomegaly appriciated, No rebound -guarding or rigidity. No Cyanosis, Clubbing or edema, No new Rash or bruise   Data Review   CBC w Diff:  Lab Results  Component Value Date   WBC 4.9 07/03/2018   HGB 15.9 07/03/2018   HCT 47.5 07/03/2018   PLT 179 07/03/2018   LYMPHOPCT 33 07/03/2018   MONOPCT 7 07/03/2018   EOSPCT 0 07/03/2018   BASOPCT 0 07/03/2018    CMP:  Lab Results  Component Value Date   NA 137 07/03/2018   K 4.5 07/03/2018   CL 105 07/03/2018   CO2 20 (L) 07/03/2018   BUN 13 07/03/2018   CREATININE 1.24 07/03/2018   PROT 6.6 07/03/2018   ALBUMIN 3.9 07/03/2018   BILITOT 2.1 (H) 07/03/2018   ALKPHOS 59 07/03/2018   AST 36 07/03/2018   ALT 37 07/03/2018  .   Total Time in preparing paper work, data evaluation and todays exam - 61 minutes  Lala Lund M.D on 07/05/2018 at 9:19 AM  [facility]   Office  [phone]

## 2018-07-05 NOTE — Progress Notes (Signed)
Walked in the room MD at bed side and patient having active seizure like activity. MD put pressure on patient finger tip and patient stop  And responsed "ouch" and the activity stopped. Patient then began to follow commands instructed by the physician.

## 2018-07-05 NOTE — Discharge Instructions (Signed)
Do not drive, operate heavy machinery, perform activities at heights, swimming or participation in water activities or provide baby sitting services until you have seen by Primary MD or a Neurologist and advised to do so again.  Follow with Primary MD [facility] in 7 days   Get CBC, CMP, 2 view Chest X ray -  checked  by Primary MD  in 5-7 days    Activity: As tolerated with Full fall precautions use walker/cane & assistance as needed  Disposition Home    Diet: Heart Healthy    Special Instructions: If you have smoked or chewed Tobacco  in the last 2 yrs please stop smoking, stop any regular Alcohol  and or any Recreational drug use.  On your next visit with your primary care physician please Get Medicines reviewed and adjusted.  Please request your Prim.MD to go over all Hospital Tests and Procedure/Radiological results at the follow up, please get all Hospital records sent to your Prim MD by signing hospital release before you go home.  If you experience worsening of your admission symptoms, develop shortness of breath, life threatening emergency, suicidal or homicidal thoughts you must seek medical attention immediately by calling 911 or calling your MD immediately  if symptoms less severe.  You Must read complete instructions/literature along with all the possible adverse reactions/side effects for all the Medicines you take and that have been prescribed to you. Take any new Medicines after you have completely understood and accpet all the possible adverse reactions/side effects.

## 2018-07-06 DIAGNOSIS — R262 Difficulty in walking, not elsewhere classified: Secondary | ICD-10-CM | POA: Diagnosis not present

## 2018-07-06 DIAGNOSIS — F445 Conversion disorder with seizures or convulsions: Secondary | ICD-10-CM | POA: Diagnosis not present

## 2018-07-06 DIAGNOSIS — R0689 Other abnormalities of breathing: Secondary | ICD-10-CM | POA: Diagnosis not present

## 2018-07-06 DIAGNOSIS — R55 Syncope and collapse: Secondary | ICD-10-CM | POA: Diagnosis not present

## 2018-07-06 DIAGNOSIS — R0902 Hypoxemia: Secondary | ICD-10-CM | POA: Diagnosis not present

## 2018-07-06 DIAGNOSIS — Z23 Encounter for immunization: Secondary | ICD-10-CM | POA: Diagnosis not present

## 2018-07-06 DIAGNOSIS — R569 Unspecified convulsions: Secondary | ICD-10-CM | POA: Diagnosis not present

## 2018-07-06 DIAGNOSIS — I10 Essential (primary) hypertension: Secondary | ICD-10-CM | POA: Diagnosis not present

## 2018-07-07 DIAGNOSIS — R569 Unspecified convulsions: Secondary | ICD-10-CM | POA: Diagnosis not present

## 2018-07-08 DIAGNOSIS — F445 Conversion disorder with seizures or convulsions: Secondary | ICD-10-CM | POA: Diagnosis not present

## 2018-07-08 DIAGNOSIS — R531 Weakness: Secondary | ICD-10-CM | POA: Diagnosis not present

## 2018-07-08 DIAGNOSIS — R319 Hematuria, unspecified: Secondary | ICD-10-CM | POA: Diagnosis not present

## 2018-07-08 LAB — URINE DRUGS OF ABUSE SCREEN W ALC, ROUTINE (REF LAB)
Amphetamines, Urine: NEGATIVE ng/mL
Barbiturate, Ur: NEGATIVE ng/mL
Cannabinoid Quant, Ur: NEGATIVE ng/mL
Cocaine (Metab.): NEGATIVE ng/mL
Ethanol U, Quan: NEGATIVE %
Methadone Screen, Urine: NEGATIVE ng/mL
Opiate Quant, Ur: NEGATIVE ng/mL
Phencyclidine, Ur: NEGATIVE ng/mL
Propoxyphene, Urine: NEGATIVE ng/mL

## 2018-07-08 LAB — DRUG PROFILE 799031: BENZODIAZEPINES: NEGATIVE

## 2018-07-22 ENCOUNTER — Encounter: Payer: Self-pay | Admitting: Neurology

## 2018-07-22 ENCOUNTER — Telehealth: Payer: Self-pay | Admitting: Neurology

## 2018-07-22 ENCOUNTER — Ambulatory Visit: Payer: BLUE CROSS/BLUE SHIELD | Admitting: Neurology

## 2018-07-22 VITALS — BP 130/84 | HR 82

## 2018-07-22 DIAGNOSIS — R569 Unspecified convulsions: Secondary | ICD-10-CM | POA: Insufficient documentation

## 2018-07-22 DIAGNOSIS — R29898 Other symptoms and signs involving the musculoskeletal system: Secondary | ICD-10-CM | POA: Diagnosis not present

## 2018-07-22 DIAGNOSIS — R269 Unspecified abnormalities of gait and mobility: Secondary | ICD-10-CM | POA: Diagnosis not present

## 2018-07-22 DIAGNOSIS — M6281 Muscle weakness (generalized): Secondary | ICD-10-CM | POA: Diagnosis not present

## 2018-07-22 MED ORDER — LAMOTRIGINE 100 MG PO TABS: 100.0000 mg | ORAL_TABLET | Freq: Two times a day (BID) | ORAL | 11 refills | Status: DC

## 2018-07-22 MED ORDER — LAMOTRIGINE 25 MG PO TABS: ORAL_TABLET | ORAL | 0 refills | Status: DC

## 2018-07-22 NOTE — Telephone Encounter (Signed)
MR Lumbar spine wo contrast Dr. Krista Blue BCBS Auth: NPR via [place] webstie. Patient is scheduled at [facility] for 07/28/18

## 2018-07-22 NOTE — Patient Instructions (Signed)
weeks Keppra 500mg   Lamotrigine 25mg   1st 1/1 1/1  2nd 1/1 2/2  3rd 0/1 3/3  4th 0/0 4/4  5th week 0/0 Lamotrigine 100 twice a day

## 2018-07-22 NOTE — Progress Notes (Signed)
PATIENT: Dale Bender DOB: Oct 14, 1970  Chief Complaint  Patient presents with  . Seizures    He is here with his wife, [name].  Reports having multiple seizures on 07/03/18 that required emergency care.  The seizures started while he was at the [facility] office having a prostate biopsy.  They called the ambulance and he was sent to the ED from their office. No previous history of seizures.  Since these events, he has also developed right leg numbness/pain.  He has been having to use a wheelchair.  He was started on Keppra 500mg  BID in the hospital. No further seizures since he has been home.  Marland Kitchen PCP    [facility] (referred by ED)     HISTORICAL  Dale Bender is a 47 year old male, seen in request by his primary care physician from the [place] practice for evaluation of seizure, he is accompanied by his wife [name], initial evaluation was on July 22, 2018.  I have reviewed and summarized the hospital discharge and neurology consult note on November 22 and 24, 2019.  He was diagnosed with urethral restriction since October 2019, painful urination, on July 03, 2018, he was arranged for prostate biopsy in conjunction with IV Levaquin, apparently had a seizure-like event during the procedure, per patient, he had no recollection of the event, woke up in the hospital, reviewing hospital discharge, on the day of discharge July 05, 2018, he had multiple seizure-like event witnessed by seizure, and the physician, but immediately stopped with painful stimulation,  He presented to local hospital again on July 06, 2018 after he was discharged to home, wife reported multiple seizure-like spells, whole body trembling, body thrashing out of the bed, lasting for few minutes, multiple episode in a day, patient stated that he had no recollection of the event, did not have loss of consciousness.   Since hospital discharge, he also complains of right leg pain,  weakness, sensory loss, difficulty walking, using walker at home,  He drives tow machine, heavy equipment operation  Personally reviewed MRI of the brain on July 03, 2018, motion degraded imaging, no acute abnormality identified,  Right leg has no feelings, walk with walker, left leg is ok, he has in continence,    EEG on Jul 04 2018: normal.  Laboratory evaluation showed normal or negative alcohol, HIV, UDS was positive for opiates, benzodiazepine, CMP, CBC   REVIEW OF SYSTEMS: Full 14 system review of systems performed and notable only for numbness, weakness, joint pain  All other review of systems were negative.  ALLERGIES: No Known Allergies  HOME MEDICATIONS: Current Outpatient Medications  Medication Sig Dispense Refill  . levETIRAcetam (KEPPRA) 500 MG tablet Take 1 tablet (500 mg total) by mouth 2 (two) times daily. 60 tablet 0   No current facility-administered medications for this visit.     PAST MEDICAL HISTORY: Past Medical History:  Diagnosis Date  . GERD (gastroesophageal reflux disease)   . Prostate nodule    benign  . Seizure ([place])   . 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 FRACTURE SURGERY     LT arm, LT leg, Rt leg  . PROSTATE BIOPSY    . URETHRAL STRICTURE DILATATION      FAMILY HISTORY: Family History  Problem Relation Age of Onset  . Hypertension Mother   . Diabetes Mother   . Other Father        died  at age 12 in accident - log fell on him  . Kidney cancer Maternal Aunt   . Prostate cancer Paternal Uncle     SOCIAL HISTORY: Social History   Socioeconomic History  . Marital status: Married    Spouse name: Not on file  . Number of children: 2  . Years of education: 10th grade  . Highest education level: Not on file  Occupational History  . Occupation: tow Geophysical data processor  Social Needs  . Financial resource strain: Not on file  . Food insecurity:    Worry:  Not on file    Inability: Not on file  . Transportation needs:    Medical: Not on file    Non-medical: Not on file  Tobacco Use  . Smoking status: Former Research scientist (life sciences)  . Smokeless tobacco: Never Used  Substance and Sexual Activity  . Alcohol use: Not Currently  . Drug use: Not Currently  . Sexual activity: Not on file  Lifestyle  . Physical activity:    Days per week: Not on file    Minutes per session: Not on file  . Stress: Not on file  Relationships  . Social connections:    Talks on phone: Not on file    Gets together: Not on file    Attends religious service: Not on file    Active member of club or organization: Not on file    Attends meetings of clubs or organizations: Not on file    Relationship status: Not on file  . Intimate partner violence:    Fear of current or ex partner: Not on file    Emotionally abused: Not on file    Physically abused: Not on file    Forced sexual activity: Not on file  Other Topics Concern  . Not on file  Social History Narrative   Lives at home with his wife and children.   Right-handed.   2 cups caffeine per day.     PHYSICAL EXAM   Vitals:   07/22/18 0841  BP: 130/84  Pulse: 82    Not recorded      There is no height or weight on file to calculate BMI.  PHYSICAL EXAMNIATION:  Gen: NAD, conversant, well nourised, obese, well groomed                     Cardiovascular: Regular rate rhythm, no peripheral edema, warm, nontender. Eyes: Conjunctivae clear without exudates or hemorrhage Neck: Supple, no carotid bruits. Pulmonary: Clear to auscultation bilaterally   NEUROLOGICAL EXAM:  MENTAL STATUS: Speech:    Speech is normal; fluent and spontaneous with normal comprehension.  Cognition:     Orientation to time, place and person     Normal recent and remote memory     Normal Attention span and concentration     Normal Language, naming, repeating,spontaneous speech     Fund of knowledge   CRANIAL NERVES: CN II: Visual  fields are full to confrontation. Fundoscopic exam is normal with sharp discs and no vascular changes. Pupils are round equal and briskly reactive to light. CN III, IV, VI: extraocular movement are normal. No ptosis. CN V: Facial sensation is intact to pinprick in all 3 divisions bilaterally. Corneal responses are intact.  CN VII: Face is symmetric with normal eye closure and smile. CN VIII: Hearing is normal to rubbing fingers CN IX, X: Palate elevates symmetrically. Phonation is normal. CN XI: Head turning and shoulder shrug are intact CN XII: Tongue is  midline with normal movements and no atrophy.  MOTOR: There is no pronator drift of out-stretched arms. Muscle bulk and tone are normal. Variable effort on motor exam of right leg.  REFLEXES: Reflexes are 2+ and symmetric at the biceps, triceps, knees, and ankles. Plantar responses are flexor.  SENSORY: Reported decreased light touch pinprick at right lower extremity  COORDINATION: Rapid alternating movements and fine finger movements are intact. There is no dysmetria on finger-to-nose and heel-knee-shin.    GAIT/STANCE: Sit in wheelchair, variable effort      DIAGNOSTIC DATA (LABS, IMAGING, TESTING) - I reviewed patient records, labs, notes, testing and imaging myself where available.   ASSESSMENT AND PLAN  Dale Bender is a 47 y.o. male     Seizure-like event,  Most recent event was on July 06, 2018  Suggestive of pseudoseizure by description, repeat EEG  Change Keppra to lamotrigine titrating to 100 mg twice a day  Right leg sensory loss, weakness, variable effort on examinations  MRI of lumbar region structural lesion,  EMG nerve conduction study  Prescription for walker   Marcial Pacas, M.D. Ph.D.  [facility] [address] [place], [place] [address] Ph: [phone] Fax: [phone]  CC: [facility]

## 2018-07-28 ENCOUNTER — Encounter (HOSPITAL_COMMUNITY): Payer: Self-pay | Admitting: Emergency Medicine

## 2018-07-28 ENCOUNTER — Ambulatory Visit: Payer: BLUE CROSS/BLUE SHIELD

## 2018-07-28 ENCOUNTER — Telehealth: Payer: Self-pay | Admitting: Neurology

## 2018-07-28 ENCOUNTER — Emergency Department (HOSPITAL_COMMUNITY): Payer: BLUE CROSS/BLUE SHIELD

## 2018-07-28 ENCOUNTER — Emergency Department (HOSPITAL_COMMUNITY)
Admission: EM | Admit: 2018-07-28 | Discharge: 2018-07-28 | Disposition: A | Discharge status: Home / Self Care | Payer: BLUE CROSS/BLUE SHIELD | Attending: Emergency Medicine | Admitting: Emergency Medicine

## 2018-07-28 DIAGNOSIS — R109 Unspecified abdominal pain: Secondary | ICD-10-CM | POA: Diagnosis not present

## 2018-07-28 DIAGNOSIS — Z87891 Personal history of nicotine dependence: Secondary | ICD-10-CM | POA: Diagnosis not present

## 2018-07-28 DIAGNOSIS — R202 Paresthesia of skin: Secondary | ICD-10-CM

## 2018-07-28 DIAGNOSIS — Z79899 Other long term (current) drug therapy: Secondary | ICD-10-CM | POA: Insufficient documentation

## 2018-07-28 DIAGNOSIS — F419 Anxiety disorder, unspecified: Secondary | ICD-10-CM | POA: Diagnosis not present

## 2018-07-28 DIAGNOSIS — R531 Weakness: Secondary | ICD-10-CM | POA: Diagnosis present

## 2018-07-28 LAB — CBC WITH DIFFERENTIAL/PLATELET
Abs Immature Granulocytes: 0.01 10*3/uL (ref 0.00–0.07)
Basophils Absolute: 0 10*3/uL (ref 0.0–0.1)
Basophils Relative: 0 %
Eosinophils Absolute: 0 10*3/uL (ref 0.0–0.5)
Eosinophils Relative: 1 %
HCT: 49.2 % (ref 39.0–52.0)
Hemoglobin: 16.5 g/dL (ref 13.0–17.0)
Immature Granulocytes: 0 %
Lymphocytes Relative: 43 %
Lymphs Abs: 3.2 10*3/uL (ref 0.7–4.0)
MCH: 28.7 pg (ref 26.0–34.0)
MCHC: 33.5 g/dL (ref 30.0–36.0)
MCV: 85.6 fL (ref 80.0–100.0)
Monocytes Absolute: 0.6 10*3/uL (ref 0.1–1.0)
Monocytes Relative: 8 %
Neutro Abs: 3.5 10*3/uL (ref 1.7–7.7)
Neutrophils Relative %: 48 %
Platelets: 208 10*3/uL (ref 150–400)
RBC: 5.75 MIL/uL (ref 4.22–5.81)
RDW: 12.1 % (ref 11.5–15.5)
WBC: 7.4 10*3/uL (ref 4.0–10.5)
nRBC: 0 % (ref 0.0–0.2)

## 2018-07-28 LAB — COMPREHENSIVE METABOLIC PANEL
ALT: 32 U/L (ref 0–44)
AST: 24 U/L (ref 15–41)
Albumin: 4.4 g/dL (ref 3.5–5.0)
Alkaline Phosphatase: 56 U/L (ref 38–126)
Anion gap: 16 — ABNORMAL HIGH (ref 5–15)
BUN: 13 mg/dL (ref 6–20)
CO2: 20 mmol/L — ABNORMAL LOW (ref 22–32)
Calcium: 9.7 mg/dL (ref 8.9–10.3)
Chloride: 105 mmol/L (ref 98–111)
Creatinine, Ser: 1.4 mg/dL — ABNORMAL HIGH (ref 0.61–1.24)
GFR calc Af Amer: >60 mL/min (ref >60)
GFR calc non Af Amer: 59 mL/min — ABNORMAL LOW (ref >60)
Glucose, Bld: 96 mg/dL (ref 70–99)
Potassium: 3.7 mmol/L (ref 3.5–5.1)
Sodium: 141 mmol/L (ref 135–145)
Total Bilirubin: 1 mg/dL (ref 0.3–1.2)
Total Protein: 7.2 g/dL (ref 6.5–8.1)

## 2018-07-28 LAB — URINALYSIS, ROUTINE W REFLEX MICROSCOPIC
Bilirubin Urine: NEGATIVE
Glucose, UA: NEGATIVE mg/dL
Hgb urine dipstick: NEGATIVE
Ketones, ur: NEGATIVE mg/dL
Leukocytes, UA: NEGATIVE
Nitrite: NEGATIVE
Protein, ur: NEGATIVE mg/dL
Specific Gravity, Urine: 1.013 (ref 1.005–1.030)
pH: 8 (ref 5.0–8.0)

## 2018-07-28 MED ORDER — LORAZEPAM 2 MG/ML IJ SOLN
1.0000 mg | Freq: Once | INTRAMUSCULAR | Status: AC
  Administered 2018-07-28: 1 mg via INTRAVENOUS
  Filled 2018-07-28: qty 1

## 2018-07-28 MED ORDER — SODIUM CHLORIDE 0.9 % IV BOLUS
1000.0000 mL | Freq: Once | INTRAVENOUS | Status: AC
  Administered 2018-07-28: 1000 mL via INTRAVENOUS

## 2018-07-28 MED ORDER — GADOBUTROL 1 MMOL/ML IV SOLN
10.0000 mL | Freq: Once | INTRAVENOUS | Status: AC | PRN
  Administered 2018-07-28: 10 mL via INTRAVENOUS

## 2018-07-28 MED ORDER — ACETAMINOPHEN 325 MG PO TABS
650.0000 mg | ORAL_TABLET | Freq: Once | ORAL | Status: AC
  Administered 2018-07-28: 650 mg via ORAL
  Filled 2018-07-28: qty 2

## 2018-07-28 NOTE — ED Notes (Signed)
Pt resting and sats dropping to high 80's. 2 L [place] placed.

## 2018-07-28 NOTE — ED Provider Notes (Addendum)
[facility] EMERGENCY DEPARTMENT Provider Note   CSN: [id number] Arrival date & time: 07/28/18  0909    History   Chief Complaint Chief Complaint  Patient presents with  . Extremity Weakness    HPI Dale Bender is a 47 y.o. male who presents with tremors. PMH significant for recent diagnosis of seizures vs pseudoseizures after a prostate biopsy, right leg weakness. The patient is unable to give a coherent history due to shaking but does respond to questions with one word answers. His wife helps with history. He had a urethral stricture in October. In November he had a biopsy of his prostate and during the procedure he had a seizure like episode. He presented to the ED on 11/22 and was admitted for status epilepticus. He had a normal work up while admitted including MRI and EEG. He was started on antiepileptics and given seizure precautions. He followed up with neurology as an outpatient on 12/11. A MRI of his lumbar spine was ordered because he was having right leg weakness. He is walking with a walker and dragging his right leg and can't feel the leg but also has pain in the leg. While getting the MRI today he started to shake and become diaphoretic therefore he was sent to the ED. No fevers, syncope, chest pain, SOB. No upper extremity weakness. He has mild low back pain and mild abdominal pain. Was changed from [place] to Lamotrigine. He has a f/u appt for Jan 24.  HPI  Past Medical History:  Diagnosis Date  . GERD (gastroesophageal reflux disease)   . Prostate nodule    benign  . Seizure ([place])   . Urethral stricture in male     Patient Active Problem List   Diagnosis Date Noted  . Seizure-like activity ([place]) 07/22/2018  . Right leg weakness 07/22/2018  . Gait abnormality 07/22/2018  . GERD (gastroesophageal reflux disease) 07/04/2018  . Hematuria 07/04/2018  . 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 FRACTURE SURGERY     LT arm, LT leg, Rt leg  . PROSTATE BIOPSY    . URETHRAL STRICTURE DILATATION         Home Medications    Prior to Admission medications   Medication Sig Start Date End Date Taking? Authorizing Provider  lamoTRIgine (LAMICTAL) 100 MG tablet Take 1 tablet (100 mg total) by mouth 2 (two) times daily. 07/22/18   Marcial Pacas, MD  lamoTRIgine (LAMICTAL) 25 MG tablet 1 tablet twice a day for the first week 2 tablets twice a day for the second week 3 tablets twice a day for the third week 4 tablets twice a day for the fourth week  For total of 140 tablets  After finish titration with small dose of lamotrigine 25 mg, change to lamotrigine 100 mg twice a day 07/22/18   Marcial Pacas, MD  levETIRAcetam (KEPPRA) 500 MG tablet Take 1 tablet (500 mg total) by mouth 2 (two) times daily. 07/05/18   Thurnell Lose, MD    Family History Family History  Problem Relation Age of Onset  . Hypertension Mother   . Diabetes Mother   . Other Father        died at age 48 in accident - log fell on him  . Kidney cancer Maternal Aunt   . Prostate cancer Paternal Uncle     Social History Social History   Tobacco Use  . Smoking status: Former Research scientist (life sciences)  .  Smokeless tobacco: Never Used  Substance Use Topics  . Alcohol use: Not Currently  . Drug use: Not Currently     Allergies   Patient has no known allergies.   Review of Systems Review of Systems  Constitutional: Positive for diaphoresis. Negative for fever.  Eyes: Negative for visual disturbance.  Respiratory: Negative for shortness of breath.   Cardiovascular: Negative for chest pain.  Gastrointestinal: Positive for abdominal pain ("a little"). Negative for diarrhea, nausea and vomiting.  Genitourinary: Negative for difficulty urinating.  Musculoskeletal: Positive for arthralgias, back pain and myalgias.  Skin: Negative for wound.  Neurological: Positive for tremors, weakness (R leg) and numbness.  Negative for dizziness, seizures and headaches.  All other systems reviewed and are negative.    Physical Exam Updated Vital Signs BP 137/87 (BP Location: Right Arm)   Pulse 86   Temp 99.1 F (37.3 C) (Rectal)   Resp (!) 36   SpO2 100%   Physical Exam Vitals signs and nursing note reviewed.  Constitutional:      General: He is not in acute distress.    Appearance: He is well-developed. He is diaphoretic.     Comments: Distressed - full body shaking. Responds to questions when asked directly but doesn't give detailed answers  HENT:     Head: Normocephalic and atraumatic.  Eyes:     General: No scleral icterus.       Right eye: No discharge.        Left eye: No discharge.     Conjunctiva/sclera: Conjunctivae normal.     Pupils: Pupils are equal, round, and reactive to light.  Neck:     Musculoskeletal: Normal range of motion.  Cardiovascular:     Rate and Rhythm: Normal rate and regular rhythm.  Pulmonary:     Effort: Pulmonary effort is normal. No respiratory distress.     Breath sounds: Normal breath sounds.  Abdominal:     General: There is no distension.     Tenderness: There is abdominal tenderness (mild, generalized).  Musculoskeletal:     Comments: 5/5 upper extremity strength  5/5 left lower extremity strength 3/5 right lower extremity strength. Intact distal pulses bilaterally  Skin:    General: Skin is warm.  Neurological:     Mental Status: He is alert and oriented to person, place, and time.  Psychiatric:        Mood and Affect: Mood is anxious.        Behavior: Behavior is cooperative.      ED Treatments / Results  Labs (all labs ordered are listed, but only abnormal results are displayed) Labs Reviewed  COMPREHENSIVE METABOLIC PANEL - Abnormal; Notable for the following components:      Result Value   CO2 20 (*)    Creatinine, Ser 1.40 (*)    GFR calc non Af Amer 59 (*)    Anion gap 16 (*)    All other components within normal limits  CBC  WITH DIFFERENTIAL/PLATELET  URINALYSIS, ROUTINE W REFLEX MICROSCOPIC    EKG EKG Interpretation  Date/Time:  Tuesday July 28 2018 09:18:37 EST Ventricular Rate:  82 PR Interval:    QRS Duration: 87 QT Interval:  347 QTC Calculation: 406 R Axis:   56 Text Interpretation:  Sinus rhythm Baseline wander in lead(s) I II III aVR aVL aVF V1 V2 V3 V4 V5 V6 no ischemic changes Confirmed by Theotis Burrow [phone]) on 07/28/2018 9:23:44 AM   Radiology Mr Lumbar Spine W Wo Contrast  Result Date: 07/28/2018 CLINICAL DATA:  Loss of feeling in the right leg after prostate biopsy in November. EXAM: MRI LUMBAR SPINE WITHOUT AND WITH CONTRAST TECHNIQUE: Multiplanar and multiecho pulse sequences of the lumbar spine were obtained without and with intravenous contrast. CONTRAST:  10 cc Gadavist COMPARISON:  CT 06/18/2018 FINDINGS: Segmentation:  5 lumbar type vertebral bodies. Alignment:  Normal Vertebrae:  Normal Conus medullaris and cauda equina: Conus extends to the L1 level. Conus and cauda equina appear normal. Paraspinal and other soft tissues: Normal Disc levels: The discs are normal. No degeneration, bulge or herniation. No stenosis of the canal or foramina. No significant facet arthropathy. IMPRESSION: Normal examination. No abnormality seen to explain the presenting symptoms. Electronically Signed   By: Nelson Chimes M.D.   On: 07/28/2018 12:21    Procedures Procedures (including critical care time)  Medications Ordered in ED Medications  sodium chloride 0.9 % bolus 1,000 mL (1,000 mLs Intravenous New Bag/Given 07/28/18 1245)  acetaminophen (TYLENOL) tablet 650 mg (has no administration in time range)  LORazepam (ATIVAN) injection 1 mg (1 mg Intravenous Given 07/28/18 0933)  LORazepam (ATIVAN) injection 1 mg (1 mg Intravenous Given 07/28/18 1124)  gadobutrol (GADAVIST) 1 MMOL/ML injection 10 mL (10 mLs Intravenous Contrast Given 07/28/18 1207)     Initial Impression / Assessment and Plan /  ED Course  I have reviewed the triage vital signs and the nursing notes.  Pertinent labs & imaging results that were available during my care of the patient were reviewed by me and considered in my medical decision making (see chart for details).  47 year old male presents with shaking and diaphoresis while getting an MRI of the lumbar spine as an outpatient today. He had a recent admission for seizure vs pseudoseizure last month. The pt cannot give a detailed history. His wife states that he has not had any seizure activity since discharge from the hospital. Episode of shaking today was not consistent with prior seizure activity. He is conscious and responding to questions but is quite anxious. Vitals are normal.   11:09 AM Rechecked pt. He is lying still with eyes closed. Discussed with his wife. She was not with him today when the MRI was being performed so is unsure if it could be from anxiety or not but does agree that he has these episodes while he is having procedures done and the only time he tolerates the procedures is when he is asleep. Labs are overall unremarkable other than mild elevation of SCr. His wife states she's been giving him NSAIDs for his pain.  Informed by primary RN that the patient is "shaking" again in MRI. Another 1mg  Ativan ordered. He has had some hypoxia due to sedation and was placed on 2L O2 via [place].  12:29 PM MRI is normal. Unclear etiology of pt's symptoms. He has a f/u with neurology for repeat EEG and EMG. Results explained to pt and his wife. He seems much more coherent now. Encouraged f/u.   Final Clinical Impressions(s) / ED Diagnoses   Final diagnoses:  Right leg paresthesias  Anxiety    ED Discharge Orders    None       Recardo Evangelist, PA-C 07/28/18 1313    Recardo Evangelist, PA-C 07/28/18 1313    Little, Wenda Overland, MD 07/29/18 [phone]

## 2018-07-28 NOTE — ED Triage Notes (Signed)
Pt had biopsy of prostate cancer at end of Nov. Was seen at neurologist and sent here. States he cannot feel right leg. Started after biopsy. Poor historian. Very diaphoretic.

## 2018-07-28 NOTE — Telephone Encounter (Signed)
I was contacted by mobile MRI technician, patient was moving during MRI scan, study has to be aborted.  He was able to get MRI of the brain with and without contrast on July 03, 2018,  Keep EMG/NCS appointment on September 04, 2010

## 2018-07-28 NOTE — ED Notes (Signed)
Per MRI, pt moving legs excessively. Requesting more meds. RN left EDP know.

## 2018-07-28 NOTE — ED Notes (Signed)
Pt to MRI

## 2018-07-28 NOTE — Discharge Instructions (Signed)
Please follow up with Dr. Krista Blue. You have an appointment on Jan 24th Please return if worsening

## 2018-08-06 DIAGNOSIS — R319 Hematuria, unspecified: Secondary | ICD-10-CM | POA: Diagnosis not present

## 2018-08-06 DIAGNOSIS — F445 Conversion disorder with seizures or convulsions: Secondary | ICD-10-CM | POA: Diagnosis not present

## 2018-08-06 DIAGNOSIS — R531 Weakness: Secondary | ICD-10-CM | POA: Diagnosis not present

## 2018-08-17 ENCOUNTER — Telehealth: Payer: Self-pay | Admitting: Neurology

## 2018-08-17 DIAGNOSIS — R29898 Other symptoms and signs involving the musculoskeletal system: Secondary | ICD-10-CM

## 2018-08-17 DIAGNOSIS — R269 Unspecified abnormalities of gait and mobility: Secondary | ICD-10-CM

## 2018-08-17 NOTE — Addendum Note (Signed)
Addended by: Hope Pigeon on: 08/17/2018 01:17 PM   Modules accepted: Orders

## 2018-08-17 NOTE — Telephone Encounter (Signed)
Called wife back. They would like referral to [facility] PT. Advised they should hear within a week or so to schedule. Asked her to call back if they do not.  She also requested to r/s EEG scheduled for 09/02/18. They are not able to make that appt. I r/s for 09/15/18.  She also asked if there was a sooner appt for EMG/NCS. Advised as of right now, nothing sooner available. We can watch for any cx. She verbalized understanding.

## 2018-08-17 NOTE — Telephone Encounter (Signed)
Pts wife [name] (on [place]) requesting a call to discuss starting PT for the pts leg. Please call to advise

## 2018-08-17 NOTE — Telephone Encounter (Addendum)
Per Dr. Felecia Shelling- okay to order PT

## 2018-08-19 ENCOUNTER — Telehealth: Payer: Self-pay | Admitting: *Deleted

## 2018-08-19 NOTE — Telephone Encounter (Signed)
error 

## 2018-08-24 DIAGNOSIS — M6281 Muscle weakness (generalized): Secondary | ICD-10-CM | POA: Diagnosis not present

## 2018-08-24 DIAGNOSIS — R262 Difficulty in walking, not elsewhere classified: Secondary | ICD-10-CM | POA: Diagnosis not present

## 2018-08-24 DIAGNOSIS — R29898 Other symptoms and signs involving the musculoskeletal system: Secondary | ICD-10-CM | POA: Diagnosis not present

## 2018-08-24 DIAGNOSIS — R269 Unspecified abnormalities of gait and mobility: Secondary | ICD-10-CM | POA: Diagnosis not present

## 2018-08-26 ENCOUNTER — Encounter: Payer: Self-pay | Admitting: *Deleted

## 2018-08-26 DIAGNOSIS — R269 Unspecified abnormalities of gait and mobility: Secondary | ICD-10-CM | POA: Diagnosis not present

## 2018-08-26 DIAGNOSIS — R29898 Other symptoms and signs involving the musculoskeletal system: Secondary | ICD-10-CM | POA: Diagnosis not present

## 2018-08-26 DIAGNOSIS — R262 Difficulty in walking, not elsewhere classified: Secondary | ICD-10-CM | POA: Diagnosis not present

## 2018-08-26 DIAGNOSIS — M6281 Muscle weakness (generalized): Secondary | ICD-10-CM | POA: Diagnosis not present

## 2018-08-26 NOTE — Telephone Encounter (Signed)
Short term disability paperwork completed, signed by MD and returned to medical records.

## 2018-08-27 NOTE — Telephone Encounter (Signed)
I faxed pt form to [facility] on 08/27/18

## 2018-09-02 ENCOUNTER — Other Ambulatory Visit: Payer: BLUE CROSS/BLUE SHIELD

## 2018-09-02 DIAGNOSIS — R269 Unspecified abnormalities of gait and mobility: Secondary | ICD-10-CM | POA: Diagnosis not present

## 2018-09-02 DIAGNOSIS — R262 Difficulty in walking, not elsewhere classified: Secondary | ICD-10-CM | POA: Diagnosis not present

## 2018-09-02 DIAGNOSIS — R29898 Other symptoms and signs involving the musculoskeletal system: Secondary | ICD-10-CM | POA: Diagnosis not present

## 2018-09-02 DIAGNOSIS — M6281 Muscle weakness (generalized): Secondary | ICD-10-CM | POA: Diagnosis not present

## 2018-09-04 ENCOUNTER — Ambulatory Visit: Payer: BLUE CROSS/BLUE SHIELD | Admitting: Neurology

## 2018-09-04 ENCOUNTER — Ambulatory Visit (INDEPENDENT_AMBULATORY_CARE_PROVIDER_SITE_OTHER): Payer: BLUE CROSS/BLUE SHIELD | Admitting: Neurology

## 2018-09-04 DIAGNOSIS — R29898 Other symptoms and signs involving the musculoskeletal system: Secondary | ICD-10-CM

## 2018-09-04 DIAGNOSIS — Z0289 Encounter for other administrative examinations: Secondary | ICD-10-CM

## 2018-09-04 DIAGNOSIS — R569 Unspecified convulsions: Secondary | ICD-10-CM

## 2018-09-04 NOTE — Procedures (Signed)
Full Name: Dale Bender Gender: Male MRN #: [id number] Date of Birth: 09-Feb-1971    Visit Date: 09/04/2018 09:52 Age: 48 Years 41 Months Old Examining Physician: Marcial Pacas, MD  Referring Physician: Marcial Pacas, MD History: 48 years old male with right leg numbness and intermittent weakness.  Summary of the tests:  Nerve conduction study: Bilateral lower extremity sensory and motor responses were normal.  Electromyography: Selected needle examination of right lower extremity muscles and right lumbosacral paraspinal muscles were normal.    Conclusion: This is a normal study.  There is no electrodiagnostic evidence of right lumbosacral radiculopathy or right lower extremity neuropathy.  ------------------------------- Marcial Pacas, M.D. PhD  [facility] [place], [place] [address] Tel: [phone] Fax: [phone]        [facility]    Nerve / Sites Muscle Latency Ref. Amplitude Ref. Rel Amp Segments Distance Velocity Ref. Area    ms ms mV mV %  cm m/s m/s mVms  R Peroneal - EDB     Ankle EDB 3.1 ?6.5 7.8 ?2.0 100 Ankle - EDB 9   16.9     Fib head EDB 11.0  6.0  76.7 Fib head - Ankle 35 44 ?44 14.1     Pop fossa EDB 13.3  5.7  95.2 Pop fossa - Fib head 10 44 ?44 13.6         Pop fossa - Ankle      L Peroneal - EDB     Ankle EDB 3.6 ?6.5 5.6 ?2.0 100 Ankle - EDB 9   13.1     Fib head EDB 11.6  4.8  85 Fib head - Ankle 35 44 ?44 13.2     Pop fossa EDB 13.9  4.1  86.8 Pop fossa - Fib head 10 44 ?44 12.1         Pop fossa - Ankle      R Tibial - AH     Ankle AH 4.3 ?5.8 9.4 ?4.0 100 Ankle - AH 9   25.4     Pop fossa AH 15.2  0.7  7.57 Pop fossa - Ankle 44 41 ?41 4.9  L Tibial - AH     Ankle AH 3.8 ?5.8 9.3 ?4.0 100 Ankle - AH 9   19.6     Pop fossa AH 14.6  5.5  58.6 Pop fossa - Ankle 44 41 ?41 12.0             SNC    Nerve / Sites Rec. Site Peak Lat Ref.  Amp Ref. Segments Distance    ms ms V V  cm  R Sural - Ankle (Calf)     Calf  Ankle 3.9 ?4.4 7 ?6 Calf - Ankle 14  L Sural - Ankle (Calf)     Calf Ankle 3.5 ?4.4 8 ?6 Calf - Ankle 14  R Superficial peroneal - Ankle     Lat leg Ankle 4.3 ?4.4 7 ?6 Lat leg - Ankle 14  L Superficial peroneal - Ankle     Lat leg Ankle 3.7 ?4.4 8 ?6 Lat leg - Ankle 14              F  Wave    Nerve F Lat Ref.   ms ms  R Tibial - AH 58.8 ?56.0  L Tibial - AH 58.6 ?56.0         EMG full       EMG  Summary Table    Spontaneous MUAP Recruitment  Muscle IA Fib PSW Fasc Other Amp Dur. Poly Pattern  R. Tibialis anterior Normal None None None _______ Normal Normal Normal Normal  R. Tibialis posterior Normal None None None _______ Normal Normal Normal Normal  R. Peroneus longus Normal None None None _______ Normal Normal Normal Normal  R. Gastrocnemius (Medial head) Normal None None None _______ Normal Normal Normal Normal  R. Vastus lateralis Normal None None None _______ Normal Normal Normal Normal  R. Gluteus medius Normal None None None _______ Normal Normal Normal Normal  R. Lumbar paraspinals (mid) Normal None None None _______ Normal Normal Normal Normal  R. Lumbar paraspinals (low) Normal None None None _______ Normal Normal Normal Normal

## 2018-09-07 DIAGNOSIS — M6281 Muscle weakness (generalized): Secondary | ICD-10-CM | POA: Diagnosis not present

## 2018-09-07 DIAGNOSIS — R29898 Other symptoms and signs involving the musculoskeletal system: Secondary | ICD-10-CM | POA: Diagnosis not present

## 2018-09-07 DIAGNOSIS — R269 Unspecified abnormalities of gait and mobility: Secondary | ICD-10-CM | POA: Diagnosis not present

## 2018-09-07 DIAGNOSIS — R262 Difficulty in walking, not elsewhere classified: Secondary | ICD-10-CM | POA: Diagnosis not present

## 2018-09-09 DIAGNOSIS — R29898 Other symptoms and signs involving the musculoskeletal system: Secondary | ICD-10-CM | POA: Diagnosis not present

## 2018-09-09 DIAGNOSIS — R262 Difficulty in walking, not elsewhere classified: Secondary | ICD-10-CM | POA: Diagnosis not present

## 2018-09-09 DIAGNOSIS — M6281 Muscle weakness (generalized): Secondary | ICD-10-CM | POA: Diagnosis not present

## 2018-09-09 DIAGNOSIS — R269 Unspecified abnormalities of gait and mobility: Secondary | ICD-10-CM | POA: Diagnosis not present

## 2018-09-15 ENCOUNTER — Other Ambulatory Visit: Payer: Self-pay

## 2018-09-30 ENCOUNTER — Telehealth: Payer: Self-pay | Admitting: *Deleted

## 2018-09-30 NOTE — Telephone Encounter (Signed)
Received a phone call from Gaspar Cola, [place] at [facility] ED.  States this patient presented to the ED with seizure-like activity after his physical therapy session. 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  He is already on Lamictal 100mg , one tablet BID.  The PA and hospitalist feel he is stable to be discharged.  The PA wanted to confirm with Dr. Krista Blue that no changes need to be made to his anti-convulsant therapy.  Per vo by Dr. Krista Blue, he should continue taking Lamictal, as prescribed.  He should follow up with his PCP and discuss psychiatric evaluation.  Additionally, he should keep his pending EEG at our office on 11/04/2018.

## 2018-10-08 DIAGNOSIS — R296 Repeated falls: Secondary | ICD-10-CM | POA: Insufficient documentation

## 2018-11-03 NOTE — Progress Notes (Deleted)
Psychiatric Initial Adult Assessment   Patient Identification: Dale Bender MRN:  [id number] Date of Evaluation:  11/03/2018 Referral Source: *** Chief Complaint:   Visit Diagnosis: No diagnosis found.  History of Present Illness:   Dale Bender is a 48 y.o. year old male with a history of depression, pseudoseizure, who is referred for depression.   Associated Signs/Symptoms: Depression Symptoms:  {DEPRESSION SYMPTOMS:20000} (Hypo) Manic Symptoms:  {BHH MANIC SYMPTOMS:22872} Anxiety Symptoms:  {BHH ANXIETY SYMPTOMS:22873} Psychotic Symptoms:  {BHH PSYCHOTIC SYMPTOMS:22874} PTSD Symptoms: {BHH PTSD SYMPTOMS:22875}  Past Psychiatric History:  Outpatient:  Psychiatry admission:  Previous suicide attempt:  Past trials of medication:  History of violence:   Previous Psychotropic Medications: {YES/NO:21197}  Substance Abuse History in the last 12 months:  {yes no:314532}  Consequences of Substance Abuse: {BHH CONSEQUENCES OF SUBSTANCE ABUSE:22880}  Past Medical History:  Past Medical History:  Diagnosis Date  . GERD (gastroesophageal reflux disease)   . Prostate nodule    benign  . Seizure ([place])   . 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 FRACTURE SURGERY     LT arm, LT leg, Rt leg  . PROSTATE BIOPSY    . URETHRAL STRICTURE DILATATION      Family Psychiatric History: ***  Family History:  Family History  Problem Relation Age of Onset  . Hypertension Mother   . Diabetes Mother   . Other Father        died at age 49 in accident - log fell on him  . Kidney cancer Maternal Aunt   . Prostate cancer Paternal Uncle     Social History:   Social History   Socioeconomic History  . Marital status: Married    Spouse name: Not on file  . Number of children: 2  . Years of education: 10th grade  . Highest education level: Not on file  Occupational History  . Occupation: tow Geophysical data processor  Social Needs  . Financial  resource strain: Not on file  . Food insecurity:    Worry: Not on file    Inability: Not on file  . Transportation needs:    Medical: Not on file    Non-medical: Not on file  Tobacco Use  . Smoking status: Former Research scientist (life sciences)  . Smokeless tobacco: Never Used  Substance and Sexual Activity  . Alcohol use: Not Currently  . Drug use: Not Currently  . Sexual activity: Not on file  Lifestyle  . Physical activity:    Days per week: Not on file    Minutes per session: Not on file  . Stress: Not on file  Relationships  . Social connections:    Talks on phone: Not on file    Gets together: Not on file    Attends religious service: Not on file    Active member of club or organization: Not on file    Attends meetings of clubs or organizations: Not on file    Relationship status: Not on file  Other Topics Concern  . Not on file  Social History Narrative   Lives at home with his wife and children.   Right-handed.   2 cups caffeine per day.    Additional Social History: ***  Allergies:  No Known Allergies  Metabolic Disorder Labs: No results found for: HGBA1C, MPG No results found for: PROLACTIN No results found for: CHOL, TRIG, HDL, CHOLHDL, VLDL, LDLCALC No results found for: TSH  Therapeutic Level Labs: No results found  for: LITHIUM No results found for: CBMZ No results found for: VALPROATE  Current Medications: Current Outpatient Medications  Medication Sig Dispense Refill  . lamoTRIgine (LAMICTAL) 100 MG tablet Take 1 tablet (100 mg total) by mouth 2 (two) times daily. 60 tablet 11  . OVER THE COUNTER MEDICATION Take 1 tablet by mouth daily as needed (heartburn/acid reflux). **Heartburn medication store brand**     No current facility-administered medications for this visit.     Musculoskeletal: Strength & Muscle Tone: within normal limits Gait & Station: normal Patient leans: N/A  Psychiatric Specialty Exam: ROS  There were no vitals taken for this visit.There is  no height or weight on file to calculate BMI.  General Appearance: Fairly Groomed  Eye Contact:  Good  Speech:  Clear and Coherent  Volume:  Normal  Mood:  {BHH MOOD:22306}  Affect:  {Affect (PAA):22687}  Thought Process:  Coherent  Orientation:  Full (Time, Place, and Person)  Thought Content:  Logical  Suicidal Thoughts:  {ST/HT (PAA):22692}  Homicidal Thoughts:  {ST/HT (PAA):22692}  Memory:  Immediate;   Good  Judgement:  {Judgement (PAA):22694}  Insight:  {Insight (PAA):22695}  Psychomotor Activity:  Normal  Concentration:  Concentration: Good and Attention Span: Good  Recall:  Good  Fund of Knowledge:Good  Language: Good  Akathisia:  No  Handed:  Right  AIMS (if indicated):  not done  Assets:  Communication Skills Desire for Improvement  ADL's:  Intact  Cognition: WNL  Sleep:  {BHH GOOD/FAIR/POOR:22877}   Screenings:   Assessment and Plan:  Assessment  Plan  The patient demonstrates the following risk factors for suicide: Chronic risk factors for suicide include: {Chronic Risk Factors for VOJJKKX:38182993}. Acute risk factors for suicide include: {Acute Risk Factors for ZJIRCVE:93810175}. Protective factors for this patient include: {Protective Factors for Suicide ZWCH:85277824}. Considering these factors, the overall suicide risk at this point appears to be {Desc; low/moderate/high:110033}. Patient {ACTION; IS/IS MPN:36144315} appropriate for outpatient follow up.   Dale Clay, MD 3/24/20201:06 PM

## 2018-11-04 ENCOUNTER — Other Ambulatory Visit: Payer: Self-pay

## 2018-11-06 ENCOUNTER — Ambulatory Visit (HOSPITAL_COMMUNITY): Payer: Self-pay | Admitting: Psychiatry

## 2018-12-02 DIAGNOSIS — Z0289 Encounter for other administrative examinations: Secondary | ICD-10-CM

## 2018-12-29 ENCOUNTER — Telehealth: Payer: Self-pay | Admitting: Neurology

## 2018-12-29 NOTE — Telephone Encounter (Signed)
I have talked with his PCP Frederick Peers, patient was given Lamotrigine for pseudoseizure.

## 2018-12-29 NOTE — Telephone Encounter (Signed)
physcian assistant Frederick Peers from [place]. She called to inform that she will see pt soon.  The reason as to why pt has not followed up with Dr Krista Blue is because he is without insurance.  Frederick Peers stated the family wants to know if pt is to continue on his lamoTRIgine (LAMICTAL) 100 MG tablet Miranda said if you can not reach her by 5pm today she will be back in the office on Thurs of this week.

## 2019-02-11 DIAGNOSIS — Z0271 Encounter for disability determination: Secondary | ICD-10-CM

## 2019-04-05 ENCOUNTER — Ambulatory Visit: Payer: Medicaid Other | Admitting: Neurology

## 2019-04-05 ENCOUNTER — Other Ambulatory Visit: Payer: Self-pay

## 2019-04-05 DIAGNOSIS — R269 Unspecified abnormalities of gait and mobility: Secondary | ICD-10-CM

## 2019-04-05 DIAGNOSIS — R29898 Other symptoms and signs involving the musculoskeletal system: Secondary | ICD-10-CM

## 2019-04-05 DIAGNOSIS — R569 Unspecified convulsions: Secondary | ICD-10-CM

## 2019-04-08 ENCOUNTER — Telehealth: Payer: Self-pay | Admitting: Neurology

## 2019-04-08 DIAGNOSIS — R030 Elevated blood-pressure reading, without diagnosis of hypertension: Secondary | ICD-10-CM | POA: Insufficient documentation

## 2019-04-08 DIAGNOSIS — G40209 Localization-related (focal) (partial) symptomatic epilepsy and epileptic syndromes with complex partial seizures, not intractable, without status epilepticus: Secondary | ICD-10-CM

## 2019-04-08 DIAGNOSIS — R0602 Shortness of breath: Secondary | ICD-10-CM | POA: Insufficient documentation

## 2019-04-08 MED ORDER — LAMOTRIGINE ER 300 MG PO TB24: 300.0000 mg | ORAL_TABLET | Freq: Every day | ORAL | 11 refills | Status: DC

## 2019-04-08 NOTE — Telephone Encounter (Signed)
I spoke to his wife and he has been scheduled on 04/13/2019.

## 2019-04-08 NOTE — Telephone Encounter (Signed)
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

## 2019-04-08 NOTE — Procedures (Signed)
   HISTORY: 48 year old male, with history of seizure  TECHNIQUE:  This is a routine 16 channel EEG recording with one channel devoted to a limited EKG recording.  It was performed during wakefulness, drowsiness and asleep.  Hyperventilation and photic stimulation were performed as activating procedures.  There are minimum muscle and movement artifact noted.  Upon maximum arousal, posterior dominant waking rhythm consistent of rhythmic alpha range activity, with frequency of 10 Hz. Activities are symmetric over the bilateral posterior derivations and attenuated with eye opening.  During EEG recording, there was gradual buildup of T3 T5 spike slow 3 Hz activity, spreading to adjacent deep, also to the right side involving P4, T6, O2 leads.  There was prolonged rhythmic 3 Hz spike slow activity, increased occurrence during photic stimulation, hyperventilation, and post hyperventilation.  During EEG recording, patient developed drowsiness and entered sleep, sleep EEG demonstrated architecture, there were frontal centrally dominant vertex waves and symmetric sleep spindles noted.  EKG demonstrate sinus rhythm, with heart rate of 108 bpm  CONCLUSION: This is an abnormal EEG, there is evidence of bilateral temporal parietal lobe dominant 3 Hz spike slow wave activities, consistent with generalized epilepsy disorder.  Marcial Pacas, M.D. Ph.D.  [facility] [place], [place] [address] Phone: [phone] Fax:      [phone]

## 2019-04-13 ENCOUNTER — Encounter: Payer: Self-pay | Admitting: Neurology

## 2019-04-13 ENCOUNTER — Ambulatory Visit: Payer: Medicaid Other | Admitting: Neurology

## 2019-04-13 ENCOUNTER — Telehealth: Payer: Self-pay | Admitting: *Deleted

## 2019-04-13 ENCOUNTER — Other Ambulatory Visit: Payer: Self-pay

## 2019-04-13 VITALS — BP 130/86 | HR 96 | Temp 98.0°F

## 2019-04-13 DIAGNOSIS — R269 Unspecified abnormalities of gait and mobility: Secondary | ICD-10-CM | POA: Diagnosis not present

## 2019-04-13 DIAGNOSIS — G40909 Epilepsy, unspecified, not intractable, without status epilepticus: Secondary | ICD-10-CM | POA: Diagnosis not present

## 2019-04-13 NOTE — Telephone Encounter (Signed)
PA for lamotrigine ER 300mg  approved by [facility] Medicaid [phone]). [id number].  Approval valid through 04/12/2020.

## 2019-04-13 NOTE — Progress Notes (Signed)
PATIENT: Dale Bender DOB: 06/12/1971  Chief Complaint  Patient presents with  . Seizures    He is here with his wife, [name], to further review his EEG results.      HISTORICAL  Dale Bender is a 48 year old male, seen in request by his primary care physician from the [facility] for evaluation of seizure, he is accompanied by his wife [name], initial evaluation was on July 22, 2018.  I have reviewed and summarized the hospital discharge and neurology consult note on November 22 and 24, 2019.  He was diagnosed with urethral restriction since October 2019, painful urination, on July 03, 2018, he was arranged for prostate biopsy in conjunction with IV Levaquin, apparently had a seizure-like event during the procedure, per patient, he had no recollection of the event, woke up in the hospital, reviewing hospital discharge, on the day of discharge on July 05, 2018, he had multiple seizure-like event witnessed by his wife and the physician, but immediately stopped with painful stimulation,  He presented to local hospital again on July 06, 2018 after he was discharged to home, wife reported multiple seizure-like spells, whole body trembling, body thrashing out of the bed, lasting for few minutes, multiple episode in a day, patient stated that he had no recollection of the event, did not have loss of consciousness.   Since hospital discharge, he also complains of right leg pain, weakness, sensory loss, difficulty walking, using walker at home,  He drives tow machine, heavy equipment operation  Personally reviewed MRI of the brain on July 03, 2018, motion degraded imaging, no acute abnormality identified,  Right leg has no feelings, walk with walker, left leg is ok, he has in continence, \  EEG on Jul 04 2018: normal.  Laboratory evaluation showed normal or negative alcohol, HIV, UDS was positive for opiates, benzodiazepine, CMP, CBC   UPDATE Sept 1 2020: He had 2 seizure activity, fall happened during sleep, wake him up from sleep, generalized tonic-clonic seizure, in June, and on March 24, 2019,  EEG was abnormal on April 05, 2019, there are frequent bilateral frontotemporal dominance 3 Hz spike slow activity, consistent with generalized epilepsy disorder.  He was taking lamotrigine 100 mg twice a day, now increased to lamotrigine ER 300 mg every night  He continue complains of low back pain, right leg numbness, weakness, using bilateral crutches to walk, variable effort during today's examination  I personally reviewed MRI of lumbar spine December 2019, with no significant abnormality noted. EMG nerve conduction study was normal in January 2020, there is no evidence of right lower extremity neuropathy or right lumbosacral radiculopathy  Laboratory evaluations in September 2019, normal CBC, CMP showed elevated creatinine 1.4, negative HIV, UDS was positive for benzodiazepine and opiates.  REVIEW OF SYSTEMS: Full 14 system review of systems performed and notable only for above All other review of systems were negative.  ALLERGIES: No Known Allergies  HOME MEDICATIONS: Current Outpatient Medications  Medication Sig Dispense Refill  . lamoTRIgine (LAMICTAL) 100 MG tablet Take 1 tablet (100 mg total) by mouth 2 (two) times daily. 60 tablet 11  . LamoTRIgine 300 MG TB24 24 hour tablet Take 1 tablet (300 mg total) by mouth at bedtime. 30 tablet 11  . sertraline (ZOLOFT) 50 MG tablet Take 50 mg by mouth daily.     No current facility-administered medications for this visit.     PAST MEDICAL HISTORY: Past Medical History:  Diagnosis Date  . GERD (gastroesophageal  reflux disease)   . Prostate nodule    benign  . Seizure ([place])   . 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 FRACTURE SURGERY     LT arm, LT leg, Rt leg  . PROSTATE BIOPSY    .  URETHRAL STRICTURE DILATATION      FAMILY HISTORY: Family History  Problem Relation Age of Onset  . Hypertension Mother   . Diabetes Mother   . Other Father        died at age 62 in accident - log fell on him  . Kidney cancer Maternal Aunt   . Prostate cancer Paternal Uncle     SOCIAL HISTORY: Social History   Socioeconomic History  . Marital status: Married    Spouse name: Not on file  . Number of children: 2  . Years of education: 10th grade  . Highest education level: Not on file  Occupational History  . Occupation: tow Geophysical data processor  Social Needs  . Financial resource strain: Not on file  . Food insecurity    Worry: Not on file    Inability: Not on file  . Transportation needs    Medical: Not on file    Non-medical: Not on file  Tobacco Use  . Smoking status: Former Research scientist (life sciences)  . Smokeless tobacco: Never Used  Substance and Sexual Activity  . Alcohol use: Not Currently  . Drug use: Not Currently  . Sexual activity: Not on file  Lifestyle  . Physical activity    Days per week: Not on file    Minutes per session: Not on file  . Stress: Not on file  Relationships  . Social Herbalist on phone: Not on file    Gets together: Not on file    Attends religious service: Not on file    Active member of club or organization: Not on file    Attends meetings of clubs or organizations: Not on file    Relationship status: Not on file  . Intimate partner violence    Fear of current or ex partner: Not on file    Emotionally abused: Not on file    Physically abused: Not on file    Forced sexual activity: Not on file  Other Topics Concern  . Not on file  Social History Narrative   Lives at home with his wife and children.   Right-handed.   2 cups caffeine per day.     PHYSICAL EXAM   Vitals:   04/13/19 1430  BP: 130/86  Pulse: 96  Temp: 98 F (36.7 C)    Not recorded      There is no height or weight on file to calculate BMI.  PHYSICAL  EXAMNIATION:  Gen: NAD, conversant, well nourised, obese, well groomed                     Cardiovascular: Regular rate rhythm, no peripheral edema, warm, nontender. Eyes: Conjunctivae clear without exudates or hemorrhage Neck: Supple, no carotid bruits. Pulmonary: Clear to auscultation bilaterally   NEUROLOGICAL EXAM:  MENTAL STATUS: Speech:    Speech is normal; fluent and spontaneous with normal comprehension.  Cognition:     Orientation to time, place and person     Normal recent and remote memory     Normal Attention span and concentration     Normal Language, naming, repeating,spontaneous speech  Fund of knowledge   CRANIAL NERVES: CN II: Visual fields are full to confrontation.  Pupils are round equal and briskly reactive to light. CN III, IV, VI: extraocular movement are normal. No ptosis. CN V: Facial sensation is intact to pinprick in all 3 divisions bilaterally. Corneal responses are intact.  CN VII: Face is symmetric with normal eye closure and smile. CN VIII: Hearing is normal to rubbing fingers CN IX, X: Palate elevates symmetrically. Phonation is normal. CN XI: Head turning and shoulder shrug are intact CN XII: Tongue is midline with normal movements and no atrophy.  MOTOR: There is no pronator drift of out-stretched arms. Muscle bulk and tone are normal. Variable effort on motor exam of right leg.  REFLEXES: Reflexes are 2+ and symmetric at the biceps, triceps, knees, and ankles. Plantar responses are flexor.  SENSORY: Reported decreased light touch pinprick at right lower extremity  COORDINATION: Rapid alternating movements and fine finger movements are intact. There is no dysmetria on finger-to-nose and heel-knee-shin.    GAIT/STANCE: Sit in wheelchair, variable effort      DIAGNOSTIC DATA (LABS, IMAGING, TESTING) - I reviewed patient records, labs, notes, testing and imaging myself where available.   ASSESSMENT AND PLAN  Dale Bender is a  48 y.o. male     Epilepsy  Most recent event was on March 24, 2019  EEG showed no significant abnormality, frequent generalized 3 Hz spike slow waves,   Increase lamotrigine to extended release 300 mg every night  Check lamotrigine level, repeat EEG  Right leg sensory loss, weakness, variable effort on examinations  MRI of lumbar showed no significant abnormality  EMG nerve conduction study was normal, there was no evidence of right lower extremity neuropathy or right lumbosacral radiculopathy.   Marcial Pacas, M.D. Ph.D.  [facility] [address] [place], [place] [address] Ph: [phone] Fax: [phone]  CC: Practice, [facility]

## 2019-04-14 ENCOUNTER — Telehealth: Payer: Self-pay

## 2019-04-14 NOTE — Telephone Encounter (Signed)
NOTES ON FILE FROM [facility] [phone] , SENT REFERRAL TO SCHEDULING  

## 2019-04-28 ENCOUNTER — Telehealth: Payer: Self-pay | Admitting: Neurology

## 2019-04-28 NOTE — Telephone Encounter (Addendum)
I called patient on behalf of Hilda Blades in medical records to advise that his disability paperwork has come through and to let pt know about our $50 form fee. Patient states he has an EEG on Mon. 9/21 and will pay then.

## 2019-05-03 ENCOUNTER — Other Ambulatory Visit: Payer: Self-pay

## 2019-05-03 ENCOUNTER — Ambulatory Visit: Payer: Medicaid Other | Admitting: Neurology

## 2019-05-03 DIAGNOSIS — G40909 Epilepsy, unspecified, not intractable, without status epilepticus: Secondary | ICD-10-CM | POA: Diagnosis not present

## 2019-05-03 DIAGNOSIS — R269 Unspecified abnormalities of gait and mobility: Secondary | ICD-10-CM

## 2019-05-04 ENCOUNTER — Encounter: Payer: Self-pay | Admitting: *Deleted

## 2019-05-04 DIAGNOSIS — Z0289 Encounter for other administrative examinations: Secondary | ICD-10-CM

## 2019-05-06 NOTE — Procedures (Signed)
   HISTORY: 48 years old male, presented with seizure. TECHNIQUE:  This is a routine 16 channel EEG recording with one channel devoted to a limited EKG recording.  It was performed during wakefulness, drowsiness and asleep.  Hyperventilation and photic stimulation were performed as activating procedures.  There are minimum muscle and movement artifact noted.  Upon maximum arousal, posterior dominant waking rhythm consistent of rhythmic alpha range activity, with frequency of 9 hz. Activities are symmetric over the bilateral posterior derivations and attenuated with eye opening.  Hyperventilation produced mild/moderate buildup with higher amplitude and the slower activities noted.  Photic stimulation did not alter the tracing.  During EEG recording, patient developed drowsiness and no deeper stage of sleep was achieved.   During EEG recording, there was no epileptiform discharge noted.  EKG demonstrate sinus rhythm, with heart rate of 96 bpm  CONCLUSION: This is a  normal awake EEG.  There is no electrodiagnostic evidence of epileptiform discharge.  Marcial Pacas, M.D. Ph.D.  [facility] [place], [place] [address] Phone: [phone] Fax:      [phone]

## 2019-05-10 ENCOUNTER — Telehealth: Payer: Self-pay | Admitting: Neurology

## 2019-05-10 NOTE — Progress Notes (Signed)
Virtual Visit via Video Note  I connected with Dale Bender on 05/17/19 at 11:00 AM EDT by a video enabled telemedicine application and verified that I am speaking with the correct person using two identifiers.   I discussed the limitations of evaluation and management by telemedicine and the availability of in person appointments. The patient expressed understanding and agreed to proceed.    I discussed the assessment and treatment plan with the patient. The patient was provided an opportunity to ask questions and all were answered. The patient agreed with the plan and demonstrated an understanding of the instructions.   The patient was advised to call back or seek an in-person evaluation if the symptoms worsen or if the condition fails to improve as anticipated.  I provided 50 minutes of non-face-to-face time during this encounter.   Dale Clay, MD      Psychiatric Initial Adult Assessment   Patient Identification: Dale Bender MRN:  [id number] Date of Evaluation:  05/17/2019 Referral Source: Denny Levy, [place] Chief Complaint:   Chief Complaint    Other; Psychiatric Evaluation    "Everything happened after that (prostate biopsy)" Visit Diagnosis:    ICD-10-CM   1. Current mild episode of major depressive disorder without prior episode (HCC)  F32.0 TSH    History of Present Illness:   Dale Bender is a 48 y.o. year old male with a history of complex partial seizure, who is referred for seizure.   EEG 03/2019:  This is an abnormal EEG, there is evidence of bilateral temporal parietal lobe dominant 3 Hz spike slow wave activities, consistent with generalized epilepsy disorder.  EEG 04/2019:  This is a  normal awake EEG.  There is no electrodiagnostic evidence of epileptiform discharge. ---------------------------------------------------------------------------------  He states that he would like his wife, Dale Bender to be present during the interview as he has  long-term memory loss since childhood. Some of the history if obtained with the help of his wife.    He states that his neurologist wants him to be seen by psychiatry. He states that he had a prostate biopsy last November 22 nd. He states that "Everything happened after that (prostate biopsy)." He is on disability from work as he needs to use crutch for right leg weakness after the procedure.  He also has had a seizure-like episode since then. The last seizure like episode was two weeks ago while he was asleep. He does not recall any event. Dale Bender states that he was "shaking whole body" with diaphoresis, and almost swallowed his tongue. His eyes remained closed and he had no incontinence. It lasted for a few minutes, and he was seen by neurologist. She believes that it was the worst episode he had. Although he cannot recall this episode, he states seizure like episodes occasionally happens when he feels upset. He tends to be irritable, impatient when people tells him "something not true." He also feels upset when he tries to "get my children straight." He used to be calmer and these are new episode for him. 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  He sits in a recliner almost all day. Although he used to enjoy fishing and playing with his children, he has not been able to do so due to his right leg weakness.   He occasionally feels depressed.  He feels fatigued and has low energy.  He denies anhedonia, although he has not been able to do anything, which he attributes to his physical condition.  He has fair concentration.  He denies any recent SI.  He denies anxiety or panic attacks. He denies alcohol use, drug use. He has not noticed any significant change after uptitration of sertraline.   Medication- sertraline 100 mg daily for 2 months  Associated Signs/Symptoms: Depression Symptoms:   depressed mood, fatigue, difficulty concentrating, (Hypo) Manic Symptoms:  denies decreased need for sleep, euphoria Anxiety Symptoms:  denies  Psychotic Symptoms:  denies AH, VH PTSD Symptoms: Had a traumatic exposure:  he rerports lack of affection by his mother, and feels he was "mistreated" Re-experiencing:  Intrusive Thoughts Nightmares Hypervigilance:  No Hyperarousal:  None Avoidance:  None   Past Psychiatric History:  Outpatient: denies  Psychiatry admission: denies  Previous suicide attempt: denies Past trials of medication: sertraline History of violence: denies   Previous Psychotropic Medications: Yes   Substance Abuse History in the last 12 months:  No.  Consequences of Substance Abuse: NA  Past Medical History:  Past Medical History:  Diagnosis Date  . GERD (gastroesophageal reflux disease)   . Prostate nodule    benign  . Seizure ([place])   . 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 FRACTURE SURGERY     LT arm, LT leg, Rt leg  . PROSTATE BIOPSY    . URETHRAL STRICTURE DILATATION      Family Psychiatric History:  Sister is diagnosed with some mental illness  Family History:  Family History  Problem Relation Age of Onset  . Hypertension Mother   . Diabetes Mother   . Other Father        died at age 26 in accident - log fell on him  . Kidney cancer Maternal Aunt   . Prostate cancer Paternal Uncle     Social History:   Social History   Socioeconomic History  . Marital status: Married    Spouse name: Not on file  . Number of children: 2  . Years of education: 10th grade  . Highest education level: Not on file  Occupational History  . Occupation: tow Geophysical data processor  Social Needs  . Financial resource strain: Not on file  . Food insecurity    Worry: Not on file    Inability: Not on file  . Transportation needs    Medical: Not on file    Non-medical: Not on file  Tobacco Use  . Smoking  status: Former Research scientist (life sciences)  . Smokeless tobacco: Never Used  Substance and Sexual Activity  . Alcohol use: Not Currently  . Drug use: Not Currently  . Sexual activity: Not on file  Lifestyle  . Physical activity    Days per week: Not on file    Minutes per session: Not on file  . Stress: Not on file  Relationships  . Social Herbalist on phone: Not on file    Gets together: Not on file    Attends religious service: Not on file    Active member of club or organization: Not on file    Attends meetings of clubs or organizations: Not on file    Relationship status: Not on file  Other Topics Concern  . Not on file  Social History Narrative   Lives at home with his wife and children.   Right-handed.   2 cups caffeine per day.  Additional Social History:  Married, he has two children (67 year old, 14 year old) He is on long term disability from his job, (currently waiting for decision from the state). He used to works at [employer] for 13 years He feels "mistreated" in his childhood by his parents. He describes it as "not like average family grouping up" as they did not show love. He later reports good relationship with his father, who deceased at age 47 many years ago. His mother is in [place]. He has estranged relationship with his brother, sister while he "don't remember" why it happened  Allergies:  No Known Allergies  Metabolic Disorder Labs: No results found for: HGBA1C, MPG No results found for: PROLACTIN No results found for: CHOL, TRIG, HDL, CHOLHDL, VLDL, LDLCALC No results found for: TSH  Therapeutic Level Labs: No results found for: LITHIUM No results found for: CBMZ No results found for: VALPROATE  Current Medications: Current Outpatient Medications  Medication Sig Dispense Refill  . LamoTRIgine 300 MG TB24 24 hour tablet Take 1 tablet (300 mg total) by mouth at bedtime. 30 tablet 11  . sertraline (ZOLOFT) 100 MG tablet Take 1.5 tablets (150 mg total) by mouth  daily. 45 tablet 0   No current facility-administered medications for this visit.     Musculoskeletal: Strength & Muscle Tone: N/A Gait & Station: N/A Patient leans: N/A  Psychiatric Specialty Exam: Review of Systems  Psychiatric/Behavioral: Positive for depression. Negative for hallucinations, memory loss, substance abuse and suicidal ideas. The patient is not nervous/anxious and does not have insomnia.   All other systems reviewed and are negative.   There were no vitals taken for this visit.There is no height or weight on file to calculate BMI.  General Appearance: Fairly Groomed  Eye Contact:  Good  Speech:  Clear and Coherent  Volume:  Normal  Mood:  Depressed  Affect:  Appropriate, Congruent and slightly restricted, down  Thought Process:  Coherent  Orientation:  Full (Time, Place, and Person)  Thought Content:  Logical  Suicidal Thoughts:  No  Homicidal Thoughts:  No  Memory:  Immediate;   Good  Judgement:  Good  Insight:  Fair  Psychomotor Activity:  Normal  Concentration:  Concentration: Good and Attention Span: Good  Recall:  Poor  Fund of Knowledge:Good  Language: Good  Akathisia:  No  Handed:  Right  AIMS (if indicated):  not done  Assets:  Communication Skills Desire for Improvement  ADL's:  Intact  Cognition: WNL  Sleep:  Good   Screenings:   Assessment and Plan:  Dale Bender is a 48 y.o. year old male with a history of complex partial seizure, who is referred for seizure.   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  Will do up titration of sertraline to target residual mood symptoms. Discussed with the patient about the impact of mental illness on seizure, and pseudoseizure is a diagnosis of exclusion. He will greatly benefit from supportive therapy/CBT; will make referral. Will obtain TSH to rule out medical cause of  depression.   Plan 1. Increase sertraline 150 mg daily  2. Referral to therapy  3. Check TSH   The patient demonstrates the following risk factors for suicide: Chronic risk factors for suicide include: psychiatric disorder of depression. Acute risk factors for suicide include: loss (financial, interpersonal, professional). Protective factors for this patient include: positive social support, responsibility to  others (children, family), coping skills and hope for the future. Considering these factors, the overall suicide risk at this point appears to be low. Patient is appropriate for outpatient follow up.   Dale Clay, MD 10/5/202011:45 AM

## 2019-05-10 NOTE — Telephone Encounter (Signed)
FMLA paperwork faxed to [facility] 05/06/19.

## 2019-05-13 ENCOUNTER — Encounter: Payer: Self-pay | Admitting: *Deleted

## 2019-05-13 ENCOUNTER — Other Ambulatory Visit: Payer: Self-pay

## 2019-05-13 ENCOUNTER — Encounter: Payer: Self-pay | Admitting: Cardiovascular Disease

## 2019-05-13 ENCOUNTER — Ambulatory Visit (INDEPENDENT_AMBULATORY_CARE_PROVIDER_SITE_OTHER): Payer: Medicaid Other | Admitting: Cardiovascular Disease

## 2019-05-13 ENCOUNTER — Telehealth: Payer: Self-pay | Admitting: Cardiovascular Disease

## 2019-05-13 VITALS — BP 128/86 | HR 92 | Ht 73.0 in | Wt 276.0 lb

## 2019-05-13 DIAGNOSIS — G4733 Obstructive sleep apnea (adult) (pediatric): Secondary | ICD-10-CM | POA: Diagnosis not present

## 2019-05-13 DIAGNOSIS — R0609 Other forms of dyspnea: Secondary | ICD-10-CM

## 2019-05-13 DIAGNOSIS — R600 Localized edema: Secondary | ICD-10-CM | POA: Diagnosis not present

## 2019-05-13 DIAGNOSIS — R0600 Dyspnea, unspecified: Secondary | ICD-10-CM

## 2019-05-13 DIAGNOSIS — R0602 Shortness of breath: Secondary | ICD-10-CM | POA: Diagnosis not present

## 2019-05-13 NOTE — Telephone Encounter (Signed)
Pre-cert Verification for the following procedure    Lexiscan Myoview scheduled for 05-25-2019 at [facility]

## 2019-05-13 NOTE — Progress Notes (Signed)
CARDIOLOGY CONSULT NOTE  Patient ID: Dale Bender MRN: [id number] DOB/AGE: 1970/11/09 48 y.o.  Admit date: (Not on file) Primary Physician: [facility], [facility] Referring Physician: Denny Levy PA-C  Reason for Consultation: Shortness of breath, fatigue, bilateral leg edema  HPI: Dale Bender is a 48 y.o. male who is being seen today for the evaluation of shortness of breath, fatigue and bilateral leg edema at the request of Denny Levy PA-C.   I reviewed records from his PCP.  I personally reviewed an ECG performed on 04/08/2019 which demonstrates sinus rhythm with no ischemic ST segment or T wave abnormalities, nor any arrhythmias.  I reviewed labs dated 04/08/2019: BUN was difficult to read in the faxed copy.  Creatinine was 1.35, sodium 143, potassium 4.9, mild elevation of liver transaminases with AST 47 and ALT 73, white blood cell 6.1, hemoglobin 15.9, platelets 204.  ECG performed in the office today which I personally interpreted demonstrates normal sinus rhythm with no ischemic ST segment or T-wave abnormalities, nor any arrhythmias.  He is here with his wife.  Apparently prior to November 2019 he was very healthy.  He was diagnosed with urethral constriction and underwent a prostate biopsy.  During that event he apparently had a seizure.  He also now has no feeling in his right leg and has to walk with crutches.  He has been developing progressive shortness of breath since November.  He denies chest pain.  He is also had swelling of both legs and feet.  His wife says he snores very badly and does have sleep apnea but has not had a sleep study in several years and needs a new CPAP machine.   No Known Allergies  Current Outpatient Medications  Medication Sig Dispense Refill  . LamoTRIgine 300 MG TB24 24 hour tablet Take 1 tablet (300 mg total) by mouth at bedtime. 30 tablet 11  . sertraline (ZOLOFT) 100 MG tablet Take 100 mg by mouth daily.      No current facility-administered medications for this visit.     Past Medical History:  Diagnosis Date  . GERD (gastroesophageal reflux disease)   . Prostate nodule    benign  . Seizure ([place])   . 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 FRACTURE SURGERY     LT arm, LT leg, Rt leg  . PROSTATE BIOPSY    . URETHRAL STRICTURE DILATATION      Social History   Socioeconomic History  . Marital status: Married    Spouse name: Not on file  . Number of children: 2  . Years of education: 10th grade  . Highest education level: Not on file  Occupational History  . Occupation: tow Geophysical data processor  Social Needs  . Financial resource strain: Not on file  . Food insecurity    Worry: Not on file    Inability: Not on file  . Transportation needs    Medical: Not on file    Non-medical: Not on file  Tobacco Use  . Smoking status: Former Research scientist (life sciences)  . Smokeless tobacco: Never Used  Substance and Sexual Activity  . Alcohol use: Not Currently  . Drug use: Not Currently  . Sexual activity: Not on file  Lifestyle  . Physical activity    Days per week: Not on file    Minutes per session: Not on file  . Stress: Not on file  Relationships  .  Social Herbalist on phone: Not on file    Gets together: Not on file    Attends religious service: Not on file    Active member of club or organization: Not on file    Attends meetings of clubs or organizations: Not on file    Relationship status: Not on file  . Intimate partner violence    Fear of current or ex partner: Not on file    Emotionally abused: Not on file    Physically abused: Not on file    Forced sexual activity: Not on file  Other Topics Concern  . Not on file  Social History Narrative   Lives at home with his wife and children.   Right-handed.   2 cups caffeine per day.     No family history of premature CAD in 1st degree relatives.  Current Meds  Medication Sig  .  LamoTRIgine 300 MG TB24 24 hour tablet Take 1 tablet (300 mg total) by mouth at bedtime.  . sertraline (ZOLOFT) 100 MG tablet Take 100 mg by mouth daily.  . [DISCONTINUED] sertraline (ZOLOFT) 50 MG tablet Take 50 mg by mouth daily.      Review of systems complete and found to be negative unless listed above in HPI    Physical exam Blood pressure 128/86, pulse 92, height 6\' 1"  (1.854 m), weight 276 lb (125.2 kg), SpO2 98 %. General: NAD Neck: No JVD, no thyromegaly or thyroid nodule.  Lungs: Clear to auscultation bilaterally with normal respiratory effort. CV: Nondisplaced PMI. Regular rate and rhythm, normal S1/S2, no S3/S4, no murmur.  Trivial bilateral leg edema.   Abdomen: Soft, nontender, no distention.  Skin: Intact without lesions or rashes.  Neurologic: Alert and oriented x 3.  Psych: Normal affect. Extremities: No clubbing or cyanosis.  HEENT: Normal.   ECG: Most recent ECG reviewed.   Labs: Lab Results  Component Value Date/Time   K 3.7 07/28/2018 09:34 AM   BUN 13 07/28/2018 09:34 AM   CREATININE 1.40 (H) 07/28/2018 09:34 AM   ALT 32 07/28/2018 09:34 AM   HGB 16.5 07/28/2018 09:34 AM     Lipids: No results found for: LDLCALC, LDLDIRECT, CHOL, TRIG, HDL      ASSESSMENT AND PLAN:  1.  Dyspnea on exertion: Unclear etiology.  ECG is normal.  There is no family history of heart disease.  I will proceed with a nuclear myocardial perfusion imaging study to evaluate for ischemic heart disease (Lexiscan Myoview). I will order a 2-D echocardiogram with Doppler to evaluate cardiac structure, function, and regional wall motion.  2.  Bilateral leg edema: I will obtain an echocardiogram to evaluate cardiac structure and function.  3.  Obstructive sleep apnea: He has not had a sleep study in several years as per his wife.  He also needs a new CPAP machine.  I will make a referral to a sleep specialist.    Disposition: Follow up in 3 months  Signed: Kate Sable,  M.D., F.A.C.C.  05/13/2019, 10:23 AM

## 2019-05-13 NOTE — Patient Instructions (Addendum)
Medication Instructions:  Continue all current medications.  Labwork: none  Testing/Procedures:  Your physician has requested that you have an echocardiogram. Echocardiography is a painless test that uses sound waves to create images of your heart. It provides your doctor with information about the size and shape of your heart and how well your heart's chambers and valves are working. This procedure takes approximately one hour. There are no restrictions for this procedure.  Your physician has requested that you have a lexiscan myoview. For further information please visit [email or web address]. Please follow instruction sheet, as given.  Office will contact with results via phone or letter.    Follow-Up: 3 months   Any Other Special Instructions Will Be Listed Below (If Applicable). You have been referred to:  [facility]  If you need a refill on your cardiac medications before your next appointment, please call your pharmacy.

## 2019-05-13 NOTE — Addendum Note (Signed)
Addended by: Laurine Blazer on: 05/13/2019 10:48 AM   Modules accepted: Orders

## 2019-05-15 IMAGING — US US SCROTUM W/ DOPPLER COMPLETE
1 series · 14 of 25 positions shown · non-contrast
Comparison: None.

CLINICAL DATA: Testicular pain

EXAM:
SCROTAL ULTRASOUND
DOPPLER ULTRASOUND OF THE TESTICLES
TECHNIQUE: Complete ultrasound examination of the testicles, epididymis, and
other scrotal structures was performed. Color and spectral Doppler
ultrasound were also utilized to evaluate blood flow to the
testicles.

[Series 1: us scrotum w/ doppler complete · 0.06mm/px · 14 of 68 slices shown]
[im 1/68]
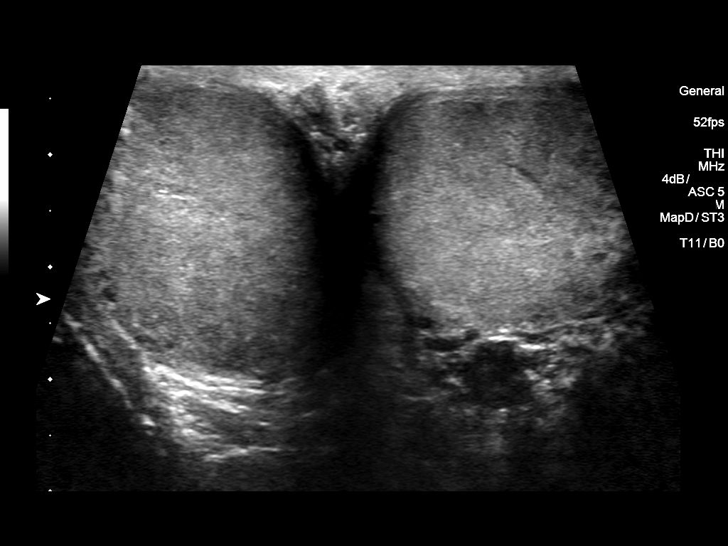
[im 6/68]
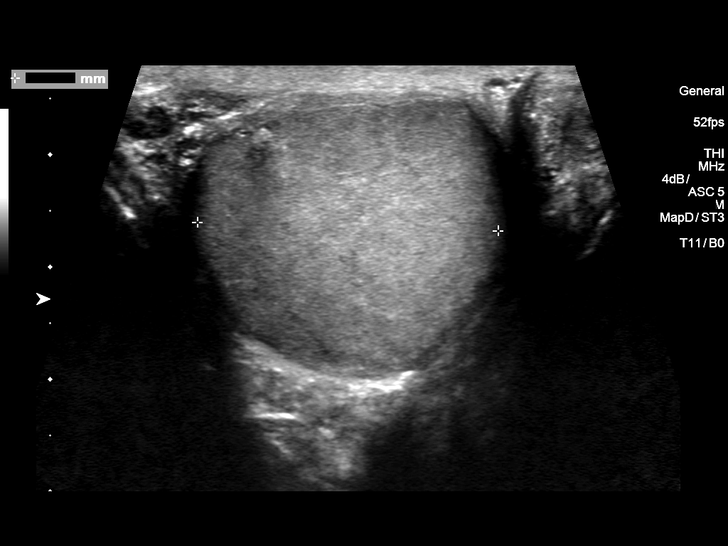
[im 12/68]
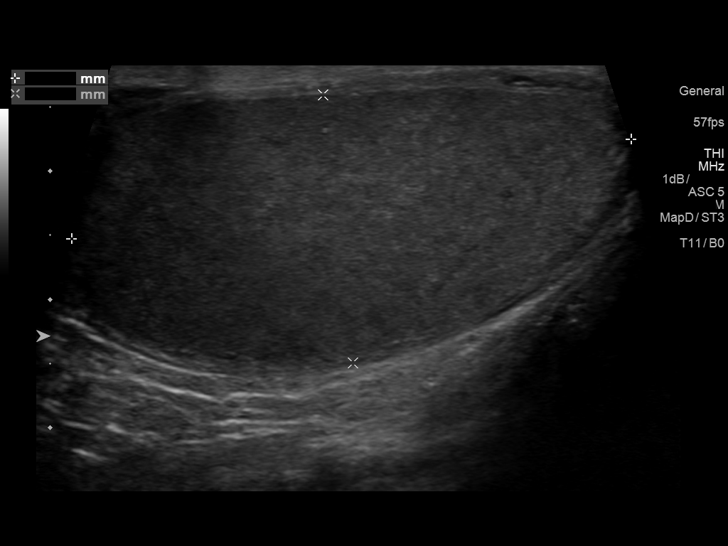
[im 17/68]
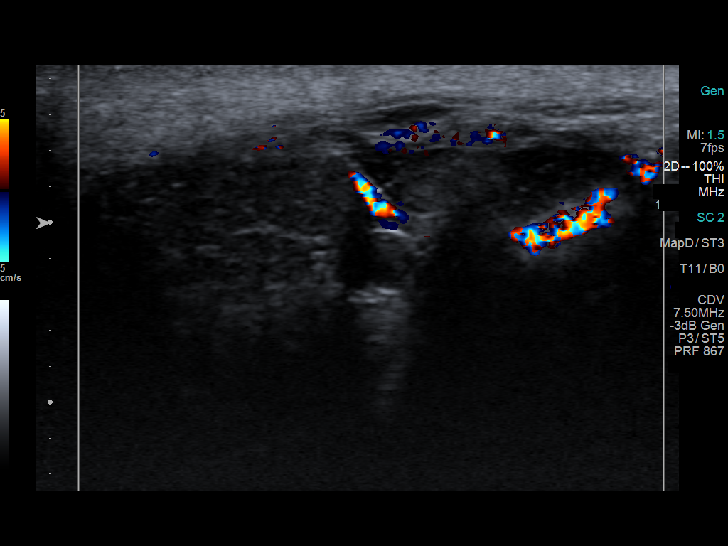
[im 23/68]
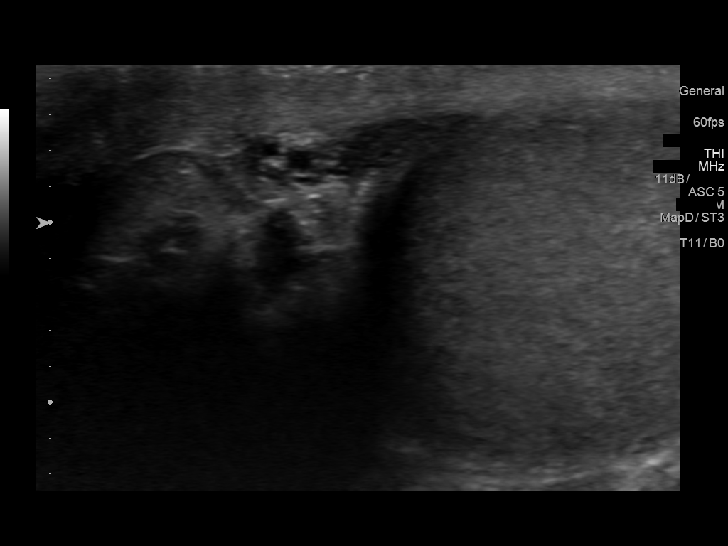
[im 26/68]
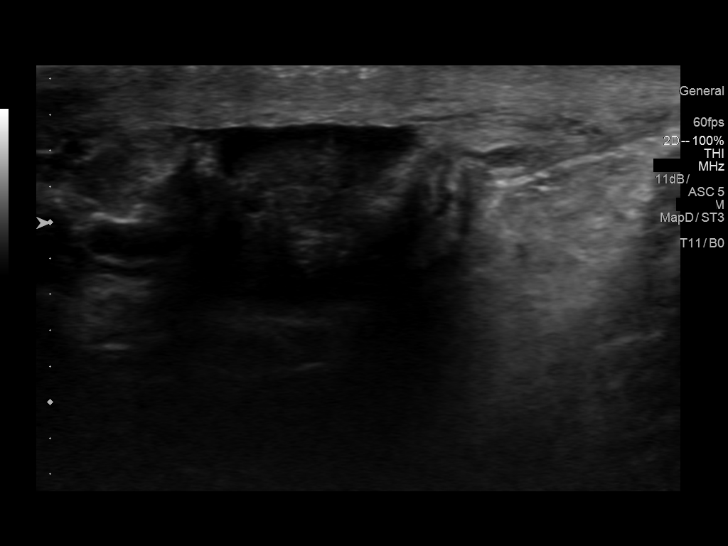
[im 31/68]
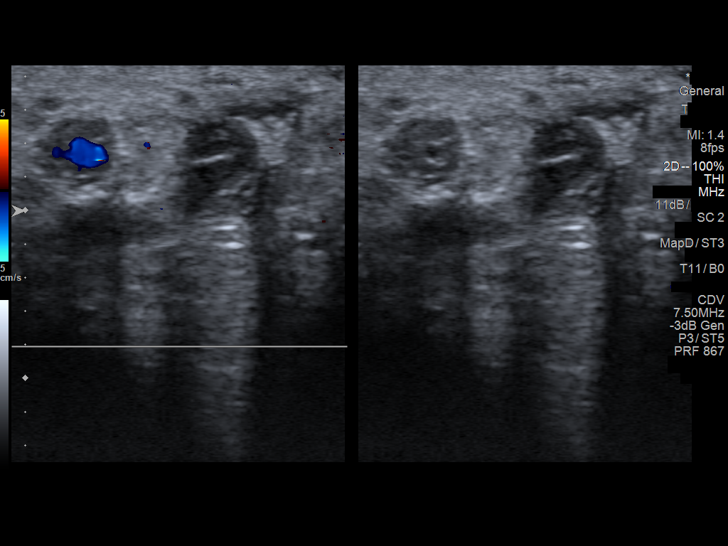
[im 37/68]
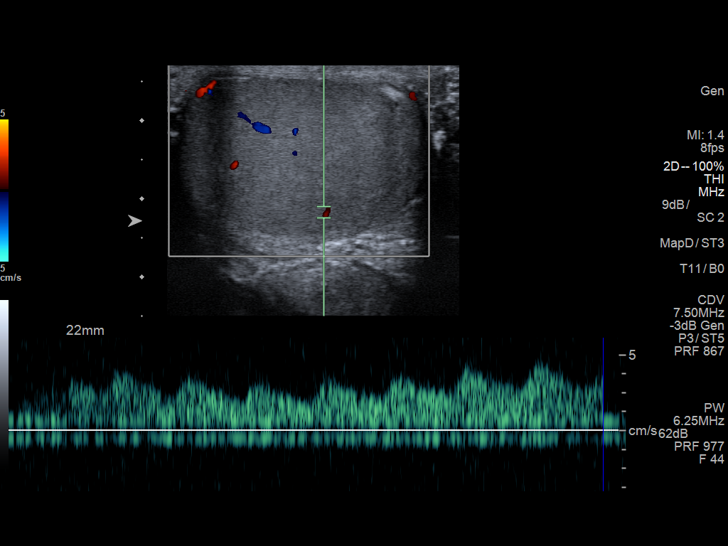
[im 42/68]
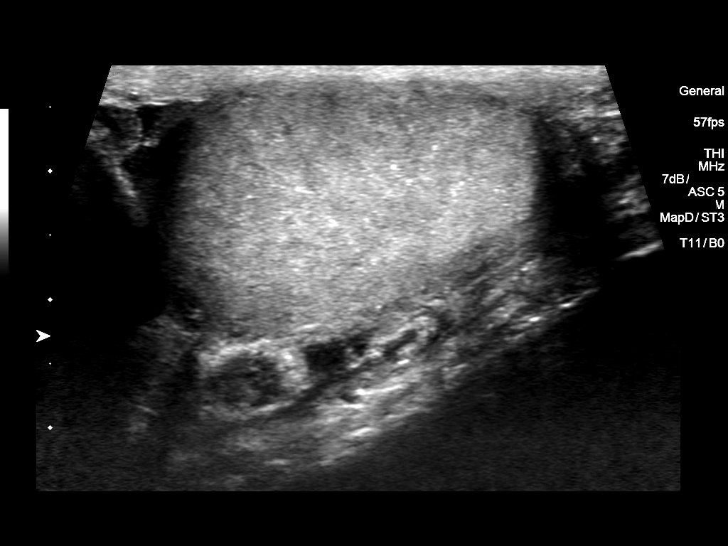
[im 45/68]
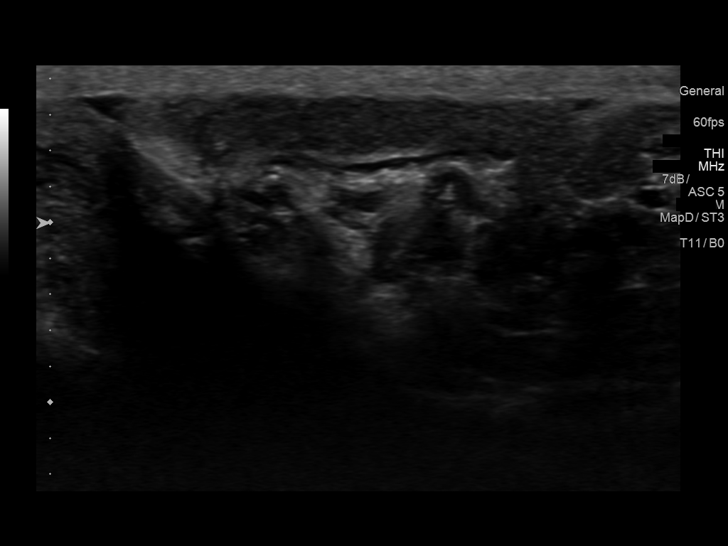
[im 51/68]
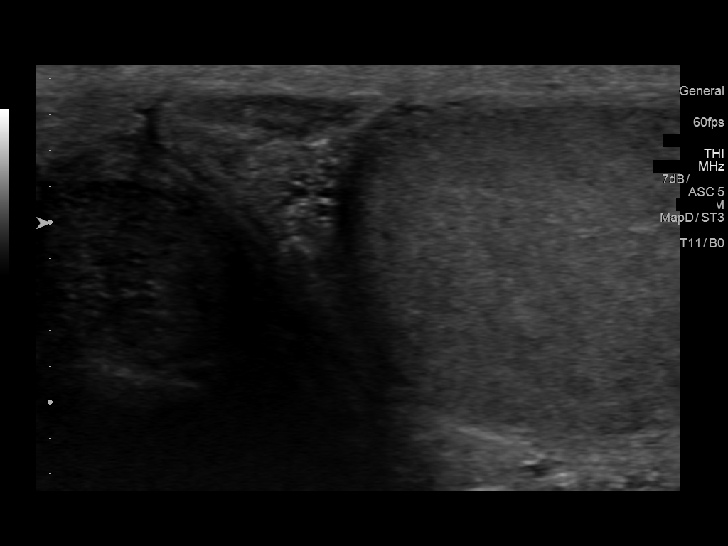
[im 56/68]
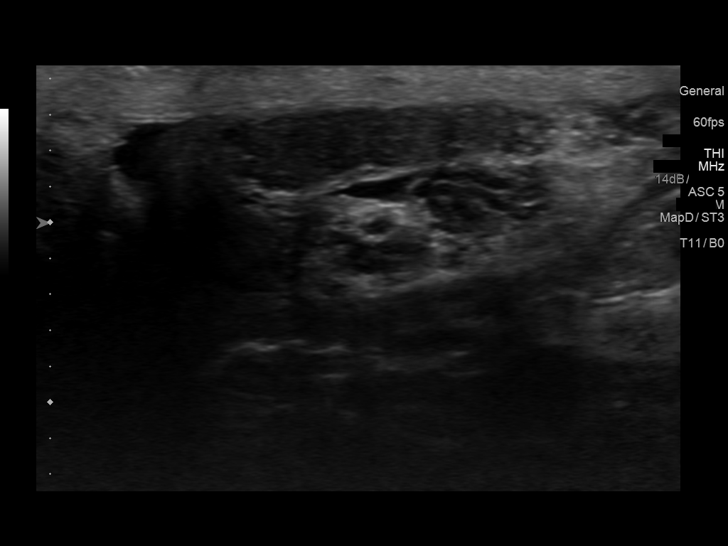
[im 62/68]
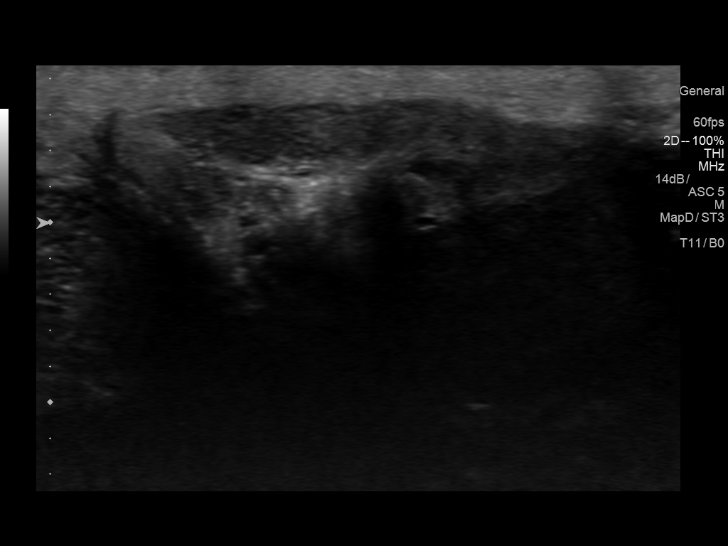
[im 68/68]
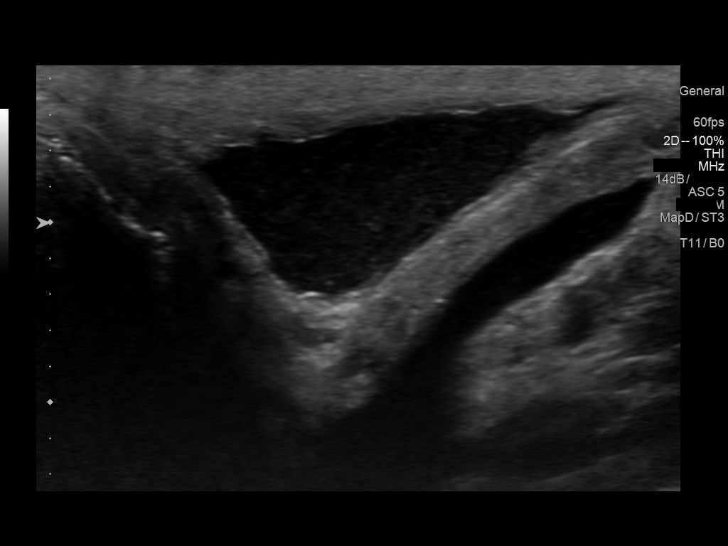

[14 of 25 positions shown; findings below may reference images not displayed]

FINDINGS: Right testicle

Measurements: 4.4 x 2.1 x 2.7 cm. No mass or microlithiasis
visualized.

Left testicle

Measurements: 4.1 x 2.1 x 3.2 cm. No mass or microlithiasis
visualized.

Right epididymis:  Normal in size and appearance.

Left epididymis:  Normal in size and appearance.

Hydrocele:  Small left hydrocele.

Varicocele:  None visualized.

Pulsed Doppler interrogation of both testes demonstrates normal low
resistance arterial and venous waveforms bilaterally.
IMPRESSION: 1. No testicular torsion.
2. No testicular mass.
3. Small left hydrocele.

## 2019-05-17 ENCOUNTER — Encounter (HOSPITAL_COMMUNITY): Payer: Self-pay | Admitting: Psychiatry

## 2019-05-17 ENCOUNTER — Ambulatory Visit (INDEPENDENT_AMBULATORY_CARE_PROVIDER_SITE_OTHER): Payer: Medicaid Other | Admitting: Psychiatry

## 2019-05-17 ENCOUNTER — Other Ambulatory Visit: Payer: Self-pay

## 2019-05-17 DIAGNOSIS — F320 Major depressive disorder, single episode, mild: Secondary | ICD-10-CM | POA: Diagnosis not present

## 2019-05-17 MED ORDER — SERTRALINE HCL 100 MG PO TABS: 150.0000 mg | ORAL_TABLET | Freq: Every day | ORAL | 0 refills | Status: DC

## 2019-05-17 NOTE — Patient Instructions (Signed)
1. Increase sertraline 150 mg daily  2. Referral to therapy  3. Check TSH

## 2019-05-18 ENCOUNTER — Encounter: Payer: Self-pay | Admitting: *Deleted

## 2019-05-18 ENCOUNTER — Other Ambulatory Visit: Payer: Self-pay | Admitting: *Deleted

## 2019-05-18 ENCOUNTER — Telehealth: Payer: Self-pay | Admitting: Neurology

## 2019-05-18 MED ORDER — LAMOTRIGINE 100 MG PO TABS: ORAL_TABLET | ORAL | 11 refills | Status: DC

## 2019-05-18 MED ORDER — LAMOTRIGINE 100 MG PO TABS: 100.0000 mg | ORAL_TABLET | Freq: Every day | ORAL | 11 refills | Status: DC

## 2019-05-18 NOTE — Addendum Note (Signed)
Addended by: Noberto Retort C on: 05/18/2019 03:23 PM   Modules accepted: Orders

## 2019-05-18 NOTE — Telephone Encounter (Signed)
Clide is still having seizures even with the increasing of his medicine. Each time he have one his right leg won't move at all. It sticks straight out like it's locked up. He can't stand at all. It takes 3 days most of the time for it to straighten out. It's frustrating not knowing what's causing this. It makes it hard because I can't move him. He's bed bound when this happens.. Wanted to make you aware of this. He had two seizures this morning and now his leg is locked up. He can't move out if the recliner.   Please call patient, will increase his lamotrigine to total 400mg  daily,   He has lamotrigine ER 300mg  qhs, I will add on lamotrigine 100mg  qam, or if they prefer, I can change to lamotrigine 100mg  2 tabs bid.  Keep follow up visit with me (ok with Oct 29th ), last visit was on Jul 22 2018

## 2019-05-18 NOTE — Addendum Note (Signed)
Addended by: Noberto Retort C on: 05/18/2019 03:24 PM   Modules accepted: Orders

## 2019-05-18 NOTE — Telephone Encounter (Addendum)
I spoke to the paitient's wife on DPR.  She is aware of Dr. Rhea Belton response below.  She has a home supply of lamotrigine ER 300mg .  She would like to continue to give that to him at night.  She is agreeable to add on the recommended lamotrigine 100mg , one tablet in morning.  He has a pending follow up scheduled on 06/10/2019 and understands he needs to keep this appt.

## 2019-05-25 ENCOUNTER — Ambulatory Visit (HOSPITAL_COMMUNITY)
Admission: RE | Admit: 2019-05-25 | Discharge: 2019-05-25 | Disposition: A | Discharge status: Home / Self Care | Payer: Medicaid Other | Source: Ambulatory Visit | Attending: Cardiovascular Disease | Admitting: Cardiovascular Disease

## 2019-05-25 ENCOUNTER — Other Ambulatory Visit: Payer: Self-pay

## 2019-05-25 ENCOUNTER — Encounter (HOSPITAL_COMMUNITY)
Admission: RE | Admit: 2019-05-25 | Discharge: 2019-05-25 | Disposition: A | Discharge status: Home / Self Care | Payer: Medicaid Other | Source: Ambulatory Visit | Attending: Cardiovascular Disease | Admitting: Cardiovascular Disease

## 2019-05-25 DIAGNOSIS — R0609 Other forms of dyspnea: Secondary | ICD-10-CM

## 2019-05-25 DIAGNOSIS — R0600 Dyspnea, unspecified: Secondary | ICD-10-CM | POA: Diagnosis present

## 2019-05-25 LAB — NM MYOCAR MULTI W/SPECT W/WALL MOTION / EF
LV dias vol: 106 mL (ref 62–150)
LV sys vol: 42 mL
Peak HR: 111 {beats}/min
RATE: 0.32
Rest HR: 78 {beats}/min
SDS: 0
SRS: 4
SSS: 4
TID: 1.22

## 2019-05-25 MED ORDER — TECHNETIUM TC 99M TETROFOSMIN IV KIT
10.0000 | PACK | Freq: Once | INTRAVENOUS | Status: AC | PRN
  Administered 2019-05-25: 10:00:00 11 via INTRAVENOUS

## 2019-05-25 MED ORDER — TECHNETIUM TC 99M TETROFOSMIN IV KIT
30.0000 | PACK | Freq: Once | INTRAVENOUS | Status: AC | PRN
  Administered 2019-05-25: 11:00:00 33 via INTRAVENOUS

## 2019-05-25 MED ORDER — REGADENOSON 0.4 MG/5ML IV SOLN
INTRAVENOUS | Status: AC
  Administered 2019-05-25: 11:00:00 0.4 mg via INTRAVENOUS
  Filled 2019-05-25: qty 5

## 2019-05-25 MED ORDER — SODIUM CHLORIDE 0.9% FLUSH
INTRAVENOUS | Status: AC
  Administered 2019-05-25: 10 mL via INTRAVENOUS
  Filled 2019-05-25: qty 10

## 2019-05-26 ENCOUNTER — Encounter: Payer: Self-pay | Admitting: Neurology

## 2019-05-26 ENCOUNTER — Ambulatory Visit: Payer: Medicaid Other | Admitting: Neurology

## 2019-05-26 VITALS — BP 136/87 | HR 80 | Temp 98.0°F | Ht 74.0 in | Wt 250.0 lb

## 2019-05-26 DIAGNOSIS — R400 Somnolence: Secondary | ICD-10-CM | POA: Diagnosis not present

## 2019-05-26 DIAGNOSIS — G4733 Obstructive sleep apnea (adult) (pediatric): Secondary | ICD-10-CM | POA: Diagnosis not present

## 2019-05-26 DIAGNOSIS — G40909 Epilepsy, unspecified, not intractable, without status epilepticus: Secondary | ICD-10-CM

## 2019-05-26 DIAGNOSIS — R351 Nocturia: Secondary | ICD-10-CM

## 2019-05-26 DIAGNOSIS — E669 Obesity, unspecified: Secondary | ICD-10-CM

## 2019-05-26 DIAGNOSIS — R519 Headache, unspecified: Secondary | ICD-10-CM

## 2019-05-26 NOTE — Patient Instructions (Signed)
Thank you for choosing [facility] for your sleep related care! It was nice to meet you today! I appreciate that you entrust me with your sleep related healthcare concerns. I hope, I was able to address at least some of your concerns today, and that I can help you feel reassured and also get better.    Here is what we discussed today and what we came up with as our plan for you:    Based on your symptoms and your exam I believe you are still at risk for obstructive sleep apnea and would benefit from reevaluation as it has been many years and you need new supplies and a new machine. Therefore, I think we should proceed with a sleep study to determine how severe your sleep apnea is. If you have more than mild OSA, I want you to consider ongoing treatment with CPAP. Please remember, the risks and ramifications of moderate to severe obstructive sleep apnea or OSA are: Cardiovascular disease, including congestive heart failure, stroke, difficult to control hypertension, arrhythmias, and even type 2 diabetes has been linked to untreated OSA. Sleep apnea causes disruption of sleep and sleep deprivation in most cases, which, in turn, can cause recurrent headaches, problems with memory, mood, concentration, focus, and vigilance. Most people with untreated sleep apnea report excessive daytime sleepiness, which can affect their ability to drive. Please do not drive if you feel sleepy.   I will likely see you back after your sleep study to go over the test results and where to go from there. We will call you after your sleep study to advise about the results (most likely, you will hear from Kristen, my nurse) and to set up an appointment at the time, as necessary.    Our sleep lab administrative assistant will call you to schedule your sleep study. If you don't hear back from her by about 2 weeks from now, please feel free to call her at [phone]. You can leave a message with your phone number  and concerns, if you get the voicemail box. She will call back as soon as possible.     

## 2019-05-26 NOTE — Progress Notes (Signed)
Subjective:    Patient ID: Dale Bender is a 48 y.o. male.  HPI     Star Age, MD, PhD [facility] [address], [address] [place], [place] [address]  Dear Dale Bender So,   Saw your patient, Dale Bender, upon your kind request in my sleep clinic today for initial consultation of his sleep disorder, in particular, evaluation of his prior diagnosis of obstructive sleep apnea.  The patient is unaccompanied today.  He reports that his wife dropped him off.  He is in a wheelchair.  He reports that he cannot walk longer distances.  He uses crutches at home. As you know, Mr. Dale Bender is a 48 year old right-handed gentleman with an underlying reflux disease, urethral stricture, seizure disorder (for which he is followed by Dr. Krista Blue), history of shortness of breath with cardiac work-up underway, and obesity, who reports snoring and excessive daytime somnolence.  I reviewed your office note from 05/13/2019.  He had a cardiac perfusion test on 05/25/2019.  Results were benign.  He is scheduled for an echocardiogram this month.  He had a prior sleep study several years ago, results are not available for my review today.  He reports that he had a diagnosis of severe obstructive sleep apnea.  He had a CPAP machine but stopped it about 5 or 6 years ago and the machine was given away.  He would be willing to get retested and consider CPAP therapy again.  His bedtime is around 930 and rise time round 6.  He has nocturia about 3 times per average night and reports occasional morning headaches.  Epworth sleepiness score is 22 out of 24 today, fatigue severity score is 52 out of 63.  He used to work for [employer] and is currently not working.  He drinks caffeine in the form of coffee, 2 cups/day on average, typically no soda currently, no alcohol currently, and quit smoking about 20 years ago.  He is trying to lose weight.  He lives with his wife and 2 children, ages 75 and  35.  He has no family history of OSA.  His Past Medical History Is Significant For: Past Medical History:  Diagnosis Date  . GERD (gastroesophageal reflux disease)   . Prostate nodule    benign  . Seizure ([place])   . Urethral stricture in male     His Past Surgical History Is Significant For: Past Surgical History:  Procedure Laterality Date  . FRACTURE SURGERY     LT arm, LT leg, Rt leg  . PROSTATE BIOPSY    . URETHRAL STRICTURE DILATATION      His Family History Is Significant For: Family History  Problem Relation Age of Onset  . Hypertension Mother   . Diabetes Mother   . Other Father        died at age 65 in accident - log fell on him  . Kidney cancer Maternal Aunt   . Prostate cancer Paternal Uncle     His Social History Is Significant For: Social History   Socioeconomic History  . Marital status: Married    Spouse name: Not on file  . Number of children: 2  . Years of education: 10th grade  . Highest education level: Not on file  Occupational History  . Occupation: tow Geophysical data processor  Social Needs  . Financial resource strain: Not on file  . Food insecurity    Worry: Not on file    Inability: Not on file  .  Transportation needs    Medical: Not on file    Non-medical: Not on file  Tobacco Use  . Smoking status: Former Research scientist (life sciences)  . Smokeless tobacco: Never Used  Substance and Sexual Activity  . Alcohol use: Not Currently  . Drug use: Not Currently  . Sexual activity: Not on file  Lifestyle  . Physical activity    Days per week: Not on file    Minutes per session: Not on file  . Stress: Not on file  Relationships  . Social Herbalist on phone: Not on file    Gets together: Not on file    Attends religious service: Not on file    Active member of club or organization: Not on file    Attends meetings of clubs or organizations: Not on file    Relationship status: Not on file  Other Topics Concern  . Not on file  Social History Narrative    Lives at home with his wife and children.   Right-handed.   2 cups caffeine per day.    His Allergies Are:  No Known Allergies:   His Current Medications Are:  Outpatient Encounter Medications as of 05/26/2019  Medication Sig  . lamoTRIgine (LAMICTAL) 100 MG tablet Take one tablet every morning.  . LamoTRIgine 300 MG TB24 24 hour tablet Take 1 tablet (300 mg total) by mouth at bedtime.  . sertraline (ZOLOFT) 100 MG tablet Take 1.5 tablets (150 mg total) by mouth daily. (Patient taking differently: Take 150 mg by mouth at bedtime. )   No facility-administered encounter medications on file as of 05/26/2019.   :  Review of Systems:  Out of a complete 14 point review of systems, all are reviewed and negative with the exception of these symptoms as listed below: Review of Systems  Neurological:       Pt presents today to discuss his sleep. Pt has had a sleep study in the past and was on cpap but he does not have it any longer. Pt does endorse snoring and talking in his sleep.  Epworth Sleepiness Scale 0= would never doze 1= slight chance of dozing 2= moderate chance of dozing 3= high chance of dozing  Sitting and reading: 3 Watching TV: 3 Sitting inactive in a public place (ex. Theater or meeting): 3 As a passenger in a car for an hour without a break: 3 Lying down to rest in the afternoon: 3 Sitting and talking to someone: 1 Sitting quietly after lunch (no alcohol): 3 In a car, while stopped in traffic: 3 Total: 22     Objective:  Neurological Exam  Physical Exam Physical Examination:   Vitals:   05/26/19 0857  BP: 136/87  Pulse: 80  Temp: 98 F (36.7 C)    General Examination: The patient is a very pleasant 48 y.o. male in no acute distress. He appears well-developed and well-nourished and well groomed.   HEENT: Normocephalic, atraumatic, pupils are equal, round and reactive to light and accommodation. Extraocular tracking is preserved, face is symmetric with  normal facial animation, speech is clear without dysarthria, hypophonia or voice tremor.  Neck is supple, neck circumference is 18 inches.  He has no carotid bruits.  Airway examination reveals moderate mouth dryness, adequate dental hygiene, mild airway crowding secondary to tonsillar size of 1+, larger tongue, Mallampati is class II, tongue protrudes centrally in palate elevates symmetrically.He has a mild overbite.  Chest: Clear to auscultation without wheezing, rhonchi or crackles noted.  Heart: S1+S2+0, regular and normal without murmurs, rubs or gallops noted.   Abdomen: Soft, non-tender and non-distended with normal bowel sounds appreciated on auscultation.  Extremities: There is no pitting edema in the distal lower extremities bilaterally.  Skin: Warm and dry without trophic changes noted.  Musculoskeletal: exam reveals no obvious joint deformities, tenderness or joint swelling or erythema.   Neurologically:  Mental status: The patient is awake, alert and oriented in all 4 spheres. His immediate and remote memory, attention, language skills and fund of knowledge are appropriate. There is no evidence of aphasia, agnosia, apraxia or anomia. Speech is clear with normal prosody and enunciation. Thought process is linear. Mood is normal and affect is normal.  Cranial nerves II - XII are as described above under HEENT exam. In addition: shoulder shrug is normal with equal shoulder height noted. Motor exam: Normal bulk, strength and tone is notedIn the upper extremities, he has right hip flexor weakness but with giveaway weakness noted. There is no UE or LE Resting tremor, no action tremor in the upper extremities. Reflexes are 1-2+Throughout. Fine motor skills and coordination:Grossly intact in the upper extremities.  He has no dysmetria or intention tremor with upper extremities, sensory exam is intact to light touch.He is situated in a wheelchair.  Assessment and Plan:  In summary, PERCEY AUTHIER is a very pleasant 48 y.o.-year old male with an underlying reflux disease, urethral stricture, seizure disorder (for which he is followed by Dr. Krista Blue), history of shortness of breath with cardiac work-up underway, and obesity, who Was previously diagnosed with obstructive sleep apnea.  He had a CPAP machine but has not used a CPAP in 5 or 6 years.  He reports a diagnosis of severe obstructive sleep apnea.  He would be willing to get retested and consider CPAP therapy again.  I talked to him about the sleep test procedure and the importance of treating obstructive sleep apnea especially severe OSA.  He is advised that CPAP therapy would be his best treatment option.  We will proceed with testing and take it from there.  I plan to see him back after testing is completed.  I answered all his questions today and he was in agreement.  Thank you very much for allowing me to participate in the care of this nice patient. If I can be of any further assistance to you please do not hesitate to call me at [phone].  Sincerely,   Star Age, MD, PhD

## 2019-06-01 ENCOUNTER — Other Ambulatory Visit (HOSPITAL_COMMUNITY): Payer: Self-pay | Admitting: *Deleted

## 2019-06-01 DIAGNOSIS — R351 Nocturia: Secondary | ICD-10-CM

## 2019-06-01 DIAGNOSIS — G4733 Obstructive sleep apnea (adult) (pediatric): Secondary | ICD-10-CM

## 2019-06-01 DIAGNOSIS — R519 Headache, unspecified: Secondary | ICD-10-CM

## 2019-06-01 DIAGNOSIS — G40909 Epilepsy, unspecified, not intractable, without status epilepticus: Secondary | ICD-10-CM

## 2019-06-01 DIAGNOSIS — R400 Somnolence: Secondary | ICD-10-CM

## 2019-06-01 DIAGNOSIS — E669 Obesity, unspecified: Secondary | ICD-10-CM

## 2019-06-01 DIAGNOSIS — F320 Major depressive disorder, single episode, mild: Secondary | ICD-10-CM

## 2019-06-02 LAB — TSH: TSH: 1.87 mIU/L (ref 0.40–4.50)

## 2019-06-10 ENCOUNTER — Ambulatory Visit: Payer: Medicaid Other

## 2019-06-10 ENCOUNTER — Ambulatory Visit: Payer: Medicaid Other | Admitting: Neurology

## 2019-06-10 ENCOUNTER — Other Ambulatory Visit: Payer: Self-pay

## 2019-06-10 ENCOUNTER — Telehealth: Payer: Self-pay | Admitting: Neurology

## 2019-06-10 ENCOUNTER — Encounter: Payer: Self-pay | Admitting: Neurology

## 2019-06-10 VITALS — BP 131/91 | HR 79 | Temp 98.0°F

## 2019-06-10 DIAGNOSIS — G40909 Epilepsy, unspecified, not intractable, without status epilepticus: Secondary | ICD-10-CM | POA: Insufficient documentation

## 2019-06-10 DIAGNOSIS — R29898 Other symptoms and signs involving the musculoskeletal system: Secondary | ICD-10-CM

## 2019-06-10 DIAGNOSIS — M6281 Muscle weakness (generalized): Secondary | ICD-10-CM

## 2019-06-10 DIAGNOSIS — R200 Anesthesia of skin: Secondary | ICD-10-CM | POA: Diagnosis not present

## 2019-06-10 NOTE — Telephone Encounter (Signed)
Appt has been cancelled.  

## 2019-06-10 NOTE — Progress Notes (Signed)
PATIENT: Dale Bender DOB: May 26, 1971  Chief Complaint  Patient presents with  . Seizures    He is here with his wife, [name].  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   No further seizures since his most recent medication adjustment on 05/18/2019.  Marland Kitchen Sleep Apnea    He has a sleep study pending on 06/20/2019.     HISTORICAL  Dale Bender is a 48 year old male, seen in request by his primary care physician from the [facility] for evaluation of seizure, he is accompanied by his wife [name], initial evaluation was on July 22, 2018.  I have reviewed and summarized the hospital discharge and neurology consult note on November 22 and 24, 2019.  He was diagnosed with urethral restriction since October 2019, painful urination, on July 03, 2018, he was arranged for prostate biopsy in conjunction with IV Levaquin, apparently had a seizure-like event during the procedure, per patient, he had no recollection of the event, woke up in the hospital, reviewing hospital discharge, on the day of discharge on July 05, 2018, he had multiple seizure-like event witnessed by his wife and the physician, but immediately stopped with painful stimulation,  He presented to local hospital again on July 06, 2018 after he was discharged to home, wife reported multiple seizure-like spells, whole body trembling, body thrashing out of the bed, lasting for few minutes, multiple episode in a day, patient stated that he had no recollection of the event, did not have loss of consciousness.   Since hospital discharge, he also complains of right leg pain, weakness, sensory loss, difficulty walking, using walker at home,  He drives tow machine, heavy equipment operation  Personally reviewed MRI of the brain on July 03, 2018, motion degraded imaging, no acute abnormality identified,  Right leg has no feelings, walk with walker, left leg is ok, he has in  continence.  EEG on Jul 04 2018: normal.  Laboratory evaluation showed normal or negative alcohol, HIV, UDS was positive for opiates, benzodiazepine, CMP, CBC  UPDATE Sept 1 2020: He had 2 seizure activity, all  happened during sleep, wake him up from sleep, generalized tonic-clonic seizure, in June, and on March 24, 2019,  EEG was abnormal on April 05, 2019, there are frequent bilateral frontotemporal dominance 3 Hz spike slow activity, consistent with generalized epilepsy disorder.  He was taking lamotrigine 100 mg twice a day, now increased to lamotrigine ER 300 mg every night  He continue complains of low back pain, right leg numbness, weakness, using bilateral crutches to walk, variable effort during today's examination  I personally reviewed MRI of lumbar spine December 2019, with no significant abnormality noted. EMG nerve conduction study was normal in January 2020, there is no evidence of right lower extremity neuropathy or right lumbosacral radiculopathy  Laboratory evaluations in September 2019, normal CBC, CMP showed elevated creatinine 1.4, negative HIV, UDS was positive for benzodiazepine and opiates.  UPDATE Jun 10 2019: Last recurrent seizure was on May 17, 2019, lamotrigine increased to 100 mg in the morning, plus lamotrigine ER 300 mg every night, he continue complains of right leg numbness, weakness, ambulate with crutches, also complains of urinary frequency urgency, sometimes cannot make it  REVIEW OF SYSTEMS: Full 14 system review of systems performed and notable only for above All other review of systems were negative.  ALLERGIES: No Known Allergies  HOME MEDICATIONS: Current Outpatient Medications  Medication Sig Dispense Refill  . lamoTRIgine (  LAMICTAL) 100 MG tablet Take one tablet every morning. 30 tablet 11  . LamoTRIgine 300 MG TB24 24 hour tablet Take 1 tablet (300 mg total) by mouth at bedtime. 30 tablet 11  . sertraline (ZOLOFT) 100 MG tablet  Take 1.5 tablets (150 mg total) by mouth daily. (Patient taking differently: Take 150 mg by mouth at bedtime. ) 45 tablet 0   No current facility-administered medications for this visit.     PAST MEDICAL HISTORY: Past Medical History:  Diagnosis Date  . GERD (gastroesophageal reflux disease)   . Prostate nodule    benign  . Seizure ([place])   . 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 FRACTURE SURGERY     LT arm, LT leg, Rt leg  . PROSTATE BIOPSY    . URETHRAL STRICTURE DILATATION      FAMILY HISTORY: Family History  Problem Relation Age of Onset  . Hypertension Mother   . Diabetes Mother   . Other Father        died at age 90 in accident - log fell on him  . Kidney cancer Maternal Aunt   . Prostate cancer Paternal Uncle     SOCIAL HISTORY: Social History   Socioeconomic History  . Marital status: Married    Spouse name: Not on file  . Number of children: 2  . Years of education: 10th grade  . Highest education level: Not on file  Occupational History  . Occupation: tow Geophysical data processor  Social Needs  . Financial resource strain: Not on file  . Food insecurity    Worry: Not on file    Inability: Not on file  . Transportation needs    Medical: Not on file    Non-medical: Not on file  Tobacco Use  . Smoking status: Former Research scientist (life sciences)  . Smokeless tobacco: Never Used  Substance and Sexual Activity  . Alcohol use: Not Currently  . Drug use: Not Currently  . Sexual activity: Not on file  Lifestyle  . Physical activity    Days per week: Not on file    Minutes per session: Not on file  . Stress: Not on file  Relationships  . Social Herbalist on phone: Not on file    Gets together: Not on file    Attends religious service: Not on file    Active member of club or organization: Not on file    Attends meetings of clubs or organizations: Not on file    Relationship status: Not on file  .  Intimate partner violence    Fear of current or ex partner: Not on file    Emotionally abused: Not on file    Physically abused: Not on file    Forced sexual activity: Not on file  Other Topics Concern  . Not on file  Social History Narrative   Lives at home with his wife and children.   Right-handed.   2 cups caffeine per day.     PHYSICAL EXAM   Vitals:   06/10/19 0742  BP: (!) 131/91  Pulse: 79  Temp: 98 F (36.7 C)    Not recorded      There is no height or weight on file to calculate BMI.  PHYSICAL EXAMNIATION:  Gen: NAD, conversant, well nourised, well groomed  Cardiovascular: Regular rate rhythm, no peripheral edema, warm, nontender. Eyes: Conjunctivae clear without exudates or hemorrhage Neck: Supple, no carotid bruits. Pulmonary: Clear to auscultation bilaterally   NEUROLOGICAL EXAM:  MENTAL STATUS: Speech:    Speech is normal; fluent and spontaneous with normal comprehension.  Cognition:     Orientation to time, place and person     Normal recent and remote memory     Normal Attention span and concentration     Normal Language, naming, repeating,spontaneous speech     Fund of knowledge   CRANIAL NERVES: CN II: Visual fields are full to confrontation.  Pupils are round equal and briskly reactive to light. CN III, IV, VI: extraocular movement are normal. No ptosis. CN V: Facial sensation is intact to pinprick in all 3 divisions bilaterally. Corneal responses are intact.  CN VII: Face is symmetric with normal eye closure and smile. CN VIII: Hearing is normal to rubbing fingers CN IX, X: Palate elevates symmetrically. Phonation is normal. CN XI: Head turning and shoulder shrug are intact CN XII: Tongue is midline with normal movements and no atrophy.  MOTOR: Bilateral upper extremity, and left lower extremity muscle strength was normal, variable effort on motor exam of right leg.  REFLEXES: Reflexes are 2+ and symmetric at the  biceps, triceps, knees, and ankles. Plantar responses are flexor.  SENSORY: Reported decreased light touch pinprick and vibratory sensation at right lower extremity  COORDINATION: Rapid alternating movements and fine finger movements are intact. There is no dysmetria on finger-to-nose and heel-knee-shin.    GAIT/STANCE: He uses crutches to get up, unsteady gait dragging right leg   DIAGNOSTIC DATA (LABS, IMAGING, TESTING) - I reviewed patient records, labs, notes, testing and imaging myself where available.   ASSESSMENT AND PLAN  Dale Bender is a 48 y.o. male     Epilepsy  Most recent event was on May 17, 2019  EEG showed frequent generalized 3 Hz spike slow waves,   Increase lamotrigine to extended release 300 mg every night +100 mg in the morning  Check lamotrigine level, repeat EEG  Right leg sensory loss, weakness, variable effort on examinations  MRI of lumbar showed no significant abnormality  EMG nerve conduction study was normal, there was no evidence of right lower extremity neuropathy or right lumbosacral radiculopathy.  MRI of cervical and thoracic spine  Marcial Pacas, M.D. Ph.D.  [facility] [address], [place], [place] [address] Ph: [phone] Fax: [phone]  CC: Practice, [facility]

## 2019-06-10 NOTE — Telephone Encounter (Signed)
Medicaid order sent to GI. They will obtain the auth and reach out to the patient to schedule.  

## 2019-06-10 NOTE — Telephone Encounter (Signed)
Please cancel EMG nerve conduction study, he had study already in January 2020

## 2019-06-11 ENCOUNTER — Other Ambulatory Visit (HOSPITAL_COMMUNITY): Payer: Self-pay | Admitting: Psychiatry

## 2019-06-11 LAB — CBC WITH DIFFERENTIAL
Basophils Absolute: 0 10*3/uL (ref 0.0–0.2)
Basos: 0 %
EOS (ABSOLUTE): 0.1 10*3/uL (ref 0.0–0.4)
Eos: 2 %
Hematocrit: 45.7 % (ref 37.5–51.0)
Hemoglobin: 15.6 g/dL (ref 13.0–17.7)
Immature Grans (Abs): 0 10*3/uL (ref 0.0–0.1)
Immature Granulocytes: 0 %
Lymphocytes Absolute: 1.5 10*3/uL (ref 0.7–3.1)
Lymphs: 32 %
MCH: 29.5 pg (ref 26.6–33.0)
MCHC: 34.1 g/dL (ref 31.5–35.7)
MCV: 87 fL (ref 79–97)
Monocytes Absolute: 0.5 10*3/uL (ref 0.1–0.9)
Monocytes: 10 %
Neutrophils Absolute: 2.6 10*3/uL (ref 1.4–7.0)
Neutrophils: 56 %
RBC: 5.28 x10E6/uL (ref 4.14–5.80)
RDW: 13.1 % (ref 11.6–15.4)
WBC: 4.6 10*3/uL (ref 3.4–10.8)

## 2019-06-11 LAB — LAMOTRIGINE LEVEL: Lamotrigine Lvl: 8.7 ug/mL (ref 2.0–20.0)

## 2019-06-11 LAB — COMPREHENSIVE METABOLIC PANEL
ALT: 46 IU/L — ABNORMAL HIGH (ref 0–44)
AST: 32 IU/L (ref 0–40)
Albumin/Globulin Ratio: 2.5 — ABNORMAL HIGH (ref 1.2–2.2)
Albumin: 4.7 g/dL (ref 4.0–5.0)
Alkaline Phosphatase: 80 IU/L (ref 39–117)
BUN/Creatinine Ratio: 9 (ref 9–20)
BUN: 12 mg/dL (ref 6–24)
Bilirubin Total: 0.6 mg/dL (ref 0.0–1.2)
CO2: 23 mmol/L (ref 20–29)
Calcium: 9.5 mg/dL (ref 8.7–10.2)
Chloride: 104 mmol/L (ref 96–106)
Creatinine, Ser: 1.41 mg/dL — ABNORMAL HIGH (ref 0.76–1.27)
GFR calc Af Amer: 67 mL/min/{1.73_m2} (ref >59)
GFR calc non Af Amer: 58 mL/min/{1.73_m2} — ABNORMAL LOW (ref >59)
Globulin, Total: 1.9 g/dL (ref 1.5–4.5)
Glucose: 92 mg/dL (ref 65–99)
Potassium: 4.4 mmol/L (ref 3.5–5.2)
Sodium: 140 mmol/L (ref 134–144)
Total Protein: 6.6 g/dL (ref 6.0–8.5)

## 2019-06-11 LAB — RPR: RPR Ser Ql: NONREACTIVE

## 2019-06-11 LAB — VITAMIN B12: Vitamin B-12: 351 pg/mL (ref 232–1245)

## 2019-06-11 LAB — TSH: TSH: 2.43 u[IU]/mL (ref 0.450–4.500)

## 2019-06-11 MED ORDER — SERTRALINE HCL 100 MG PO TABS: 150.0000 mg | ORAL_TABLET | Freq: Every day | ORAL | 0 refills | Status: DC

## 2019-06-15 ENCOUNTER — Encounter (HOSPITAL_COMMUNITY): Payer: Self-pay | Admitting: Licensed Clinical Social Worker

## 2019-06-15 ENCOUNTER — Other Ambulatory Visit: Payer: Self-pay

## 2019-06-15 ENCOUNTER — Ambulatory Visit: Payer: Medicaid Other | Admitting: Neurology

## 2019-06-15 ENCOUNTER — Ambulatory Visit (INDEPENDENT_AMBULATORY_CARE_PROVIDER_SITE_OTHER): Payer: Medicaid Other | Admitting: Licensed Clinical Social Worker

## 2019-06-15 DIAGNOSIS — F320 Major depressive disorder, single episode, mild: Secondary | ICD-10-CM

## 2019-06-15 NOTE — Progress Notes (Signed)
Virtual Visit via Video Note  I connected with Dale Bender on 06/15/19 at  9:00 AM EST by a video enabled telemedicine application and verified that I am speaking with the correct person using two identifiers.  Location: Patient: Home Provider: Office   I discussed the limitations of evaluation and management by telemedicine and the availability of in person appointments. The patient expressed understanding and agreed to proceed.    Comprehensive Clinical Assessment (CCA) Note  06/15/2019 Dale Bender [id number]  Visit Diagnosis:      ICD-10-CM   1. Major depressive disorder, single episode, mild with anxious distress ([place])  F32.0       CCA Part One  Part One has been completed on paper by the patient.  (See scanned document in Chart Review)  CCA Part Two A  Intake/Chief Complaint:  CCA Intake With Chief Complaint CCA Part Two Date: 06/15/19 CCA Part Two Time: 0907 Chief Complaint/Presenting Problem: Mood and anxiety Patients Currently Reported Symptoms/Problems: Mood: sleep all the time, irritable, don't feel like doing much, SI at times, low energy, some difficulty with concentration, reduced appetite, tearfulness,    Anxiety: worries, panic attack,  physical health issues: siezures, loss of feeling in his lef Collateral Involvement: None Individual's Strengths: Family focused, wants to get better, church Individual's Preferences: Prefer that health issues didn't occur, prefers to be with family, prefers to go to church Individual's Abilities: Golf, drive a tow motor Type of Services Patient Feels Are Needed: Therapy, medication Initial Clinical Notes/Concerns: Symptoms started when he was 47 after a proceduce, symptoms occur daily, symptoms are mild  Mental Health Symptoms Depression:  Depression: Increase/decrease in appetite, Irritability, Tearfulness, Sleep (too much or little), Change in energy/activity, Difficulty Concentrating, Weight gain/loss, Hopelessness   Mania:  Mania: N/A  Anxiety:   Anxiety: Difficulty concentrating, Irritability, Tension, Worrying, Sleep, Restlessness  Psychosis:  Psychosis: N/A  Trauma:  Trauma: N/A  Obsessions:  Obsessions: N/A  Compulsions:  Compulsions: N/A  Inattention:  Inattention: N/A  Hyperactivity/Impulsivity:  Hyperactivity/Impulsivity: N/A  Oppositional/Defiant Behaviors:  Oppositional/Defiant Behaviors: N/A  Borderline Personality:  Emotional Irregularity: N/A  Other Mood/Personality Symptoms:  Other Mood/Personality Symtpoms: N/A   Mental Status Exam Appearance and self-care  Stature:  Stature: Average  Weight:  Weight: Overweight  Clothing:  Clothing: Casual  Grooming:  Grooming: Normal  Cosmetic use:  Cosmetic Use: None  Posture/gait:  Posture/Gait: Normal  Motor activity:  Motor Activity: Not Remarkable  Sensorium  Attention:  Attention: Normal  Concentration:  Concentration: Normal  Orientation:  Orientation: X5  Recall/memory:  Recall/Memory: Normal  Affect and Mood  Affect:  Affect: Depressed  Mood:  Mood: Depressed  Relating  Eye contact:  Eye Contact: Normal  Facial expression:  Facial Expression: Depressed  Attitude toward examiner:  Attitude Toward Examiner: Cooperative  Thought and Language  Speech flow: Speech Flow: Normal  Thought content:  Thought Content: Appropriate to mood and circumstances  Preoccupation:  Preoccupations: (N/A)  Hallucinations:  Hallucinations: (N/A)  Organization:   Logical   Transport planner of Knowledge:  Fund of Knowledge: Average  Intelligence:  Intelligence: Above Average  Abstraction:  Abstraction: Normal  Judgement:  Judgement: Normal  Reality Testing:  Reality Testing: Adequate  Insight:  Insight: Good  Decision Making:  Decision Making: Normal  Social Functioning  Social Maturity:  Social Maturity: Responsible  Social Judgement:  Social Judgement: Normal  Stress  Stressors:  Stressors: Illness  Coping Ability:  Coping Ability:  English as a second language teacher Deficits:  Health issues  Supports:   Family   Family and Psychosocial History: Family history Marital status: Married Number of Years Married: 5 What types of issues is patient dealing with in the relationship?: No issues Additional relationship information: None Are you sexually active?: No What is your sexual orientation?: Heterosexual Has your sexual activity been affected by drugs, alcohol, medication, or emotional stress?: Medical issue Does patient have children?: Yes How many children?: 2 How is patient's relationship with their children?: Son, Daughter: Good relationship  Childhood History:  Childhood History By whom was/is the patient raised?: Mother, Father Additional childhood history information: Patient describes childhood as " good and bad." Dad was present some times Description of patient's relationship with caregiver when they were a child: Mother: Good   Father: Good Patient's description of current relationship with people who raised him/her: Mother: ok  relationship,   Father: Deceased How were you disciplined when you got in trouble as a child/adolescent?: Spanking Does patient have siblings?: Yes Number of Siblings: 5 Description of patient's current relationship with siblings: 4 brothers, 1 sister: Good relationship Did patient suffer any verbal/emotional/physical/sexual abuse as a child?: No Did patient suffer from severe childhood neglect?: No Has patient ever been sexually abused/assaulted/raped as an adolescent or adult?: No Was the patient ever a victim of a crime or a disaster?: No Witnessed domestic violence?: No Has patient been effected by domestic violence as an adult?: No  CCA Part Two B  Employment/Work Situation: Employment / Work Copywriter, advertising Employment situation: On disability Why is patient on disability: Medical issues How long has patient been on disability: 1 year 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?: 13 Are There Guns or Other Weapons in [place]?: No  Education: Museum/gallery curator Currently Attending: N/A Last Grade Completed: 9 Name of [place]: [place] Did Teacher, adult education From [employer]?: No Did [place]?: No Did Heritage manager?: No Did You Have Any Special Interests In School?: None identified Did You Have An Individualized Education Program (IIEP): Yes(Was in Learning Disabled classes) Did You Have Any Difficulty At [employer]?: Yes Were Any Medications Ever Prescribed For These Difficulties?: No  Religion: Religion/Spirituality Are You A Religious Person?: Yes What is Your Religious Affiliation?: Christian How Might This Affect Treatment?: Support  Leisure/Recreation: Leisure / Recreation Leisure and Hobbies: None  Exercise/Diet: Exercise/Diet Do You Exercise?: No Have You Gained or Lost A Significant Amount of Weight in the Past Six Months?: Yes-Gained Number of Pounds Gained: 40 Do You Follow a Special Diet?: No Do You Have Any Trouble Sleeping?: Yes Explanation of Sleeping Difficulties: Without medication it is hard to stay asleep and fall asleep  CCA Part Two C  Alcohol/Drug Use: Alcohol / Drug Use Pain Medications: Denies Prescriptions: Denies Over the Counter: Denies History of alcohol / drug use?: No history of alcohol / drug abuse                      CCA Part Three  ASAM's:  Six Dimensions of Multidimensional Assessment  Dimension 1:  Acute Intoxication and/or Withdrawal Potential:  Dimension 1:  Comments: None  Dimension 2:  Biomedical Conditions and Complications:  Dimension 2:  Comments: None  Dimension 3:  Emotional, Behavioral, or Cognitive Conditions and Complications:  Dimension 3:  Comments: None  Dimension 4:  Readiness to Change:  Dimension 4:  Comments: Noen  Dimension 5:  Relapse, Continued use, or Continued Problem Potential:   Dimension 5:  Comments: None  Dimension 6:  Recovery/Living Environment:  Dimension 6:  Recovery/Living Environment Comments: None   Substance use Disorder (SUD)    Social Function:  Social Functioning Social Maturity: Responsible Social Judgement: Normal  Stress:  Stress Stressors: Illness Coping Ability: Overwhelmed Patient Takes Medications The Way The Doctor Instructed?: Yes Priority Risk: Low Acuity  Risk Assessment- Self-Harm Potential: Risk Assessment For Self-Harm Potential Thoughts of Self-Harm: Vague current thoughts Method: No plan Availability of Means: No access/NA  Risk Assessment -Dangerous to Others Potential: Risk Assessment For Dangerous to Others Potential Method: No Plan Availability of Means: No access or NA Intent: Vague intent or NA Notification Required: No need or identified person  DSM5 Diagnoses: Patient Active Problem List   Diagnosis Date Noted  . Seizure disorder ([place]) 06/10/2019  . Right leg numbness 06/10/2019  . 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[place]) 04/13/2019  . Seizure-like activity ([place]) 07/22/2018  . Right leg weakness 07/22/2018  . Gait abnormality 07/22/2018  . GERD (gastroesophageal reflux disease) 07/04/2018  . Hematuria 07/04/2018  . Seizures, generalized convulsive ([place]) 07/03/2018    Patient Centered Plan: Patient is on the following Treatment Plan(s):  Anxiety and Depression  Recommendations for Services/Supports/Treatments: Recommendations for Services/Supports/Treatments Recommendations For Services/Supports/Treatments: Medication Management, Individual Therapy  Treatment Plan Summary: OP Treatment Plan Summary: Deondray will manage symptoms of depression as evidenced by managing mood swings, thoughts of suicide, and dealing with daily stressors related to health for 5 out of 7 days for 60 days.   Referrals to Alternative Service(s): Referred to Alternative Service(s):   Place:   Date:   Time:     Referred to Alternative Service(s):   Place:   Date:   Time:    Referred to Alternative Service(s):   Place:   Date:   Time:    Referred to Alternative Service(s):   Place:   Date:   Time:      I discussed the assessment and treatment plan with the patient. The patient was provided an opportunity to ask questions and all were answered. The patient agreed with the plan and demonstrated an understanding of the instructions.   The patient was advised to call back or seek an in-person evaluation if the symptoms worsen or if the condition fails to improve as anticipated.  I provided 50 minutes of non-face-to-face time during this encounter.  Glori Bickers, LCSW

## 2019-06-16 ENCOUNTER — Ambulatory Visit: Payer: Medicaid Other | Admitting: Neurology

## 2019-06-16 ENCOUNTER — Other Ambulatory Visit: Payer: Self-pay

## 2019-06-16 DIAGNOSIS — G40909 Epilepsy, unspecified, not intractable, without status epilepticus: Secondary | ICD-10-CM

## 2019-06-16 DIAGNOSIS — R200 Anesthesia of skin: Secondary | ICD-10-CM

## 2019-06-16 DIAGNOSIS — M6281 Muscle weakness (generalized): Secondary | ICD-10-CM

## 2019-06-16 DIAGNOSIS — R29898 Other symptoms and signs involving the musculoskeletal system: Secondary | ICD-10-CM

## 2019-06-16 NOTE — Progress Notes (Signed)
Virtual Visit via Video Note  I connected with Dale Bender on 06/24/19 at 11:30 AM EST by a video enabled telemedicine application and verified that I am speaking with the correct person using two identifiers.   I discussed the limitations of evaluation and management by telemedicine and the availability of in person appointments. The patient expressed understanding and agreed to proceed.   I discussed the assessment and treatment plan with the patient. The patient was provided an opportunity to ask questions and all were answered. The patient agreed with the plan and demonstrated an understanding of the instructions.   The patient was advised to call back or seek an in-person evaluation if the symptoms worsen or if the condition fails to improve as anticipated.  I provided 15 minutes of non-face-to-face time during this encounter.   [name], MD    [facility] MD/PA/NP OP Progress Note  06/24/2019 12:03 PM Dale Bender  MRN:  [id number]  Chief Complaint:  Chief Complaint    Depression; Follow-up     HPI:  This is a follow-up appointment for depression.  He states that he has been feeling much better.  He feels good that state disability has been approved. He enjoys church activity and sitting on the porch watching his kids play. He wishes to have more time to play with his kids and go outside.  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 He believes that prayer helps him a lot to feel this way. He reports good relationship with his wife and his children. He feels less depressed. He has more motivation. He has fair concentration (he feels comfortable doing interview on his own, while his wife needed to attend at his previous visit to elaborate the history). He has good appetite. He denies SI. He denies anxiety, irritability or panic attacks. He is hoping to  apply for long term disability. He does not think he can work due to his seizure, leg weakness, and "I'm depressed."  Visit Diagnosis:    ICD-10-CM   1. MDD (major depressive disorder), recurrent episode, mild ([place])  F33.0     Past Psychiatric History: Please see initial evaluation for full details. I have reviewed the history. No updates at this time.     Past Medical History:  Past Medical History:  Diagnosis Date  . GERD (gastroesophageal reflux disease)   . Prostate nodule    benign  . Seizure ([place])   . 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 FRACTURE SURGERY     LT arm, LT leg, Rt leg  . PROSTATE BIOPSY    . URETHRAL STRICTURE DILATATION      Family Psychiatric History: Please see initial evaluation for full details. I have reviewed the history. No updates at this time.     Family History:  Family History  Problem Relation Age of Onset  . Hypertension Mother   . Diabetes Mother   . Other Father        died at age 23 in accident - log fell on him  . Kidney cancer Maternal Aunt   . Prostate cancer Paternal Uncle     Social History:  Social History   Socioeconomic History  . Marital status: Married    Spouse name: Not on file  . Number of children: 2  . Years of education:  10th grade  . Highest education level: Not on file  Occupational History  . Occupation: tow Geophysical data processor  Social Needs  . Financial resource strain: Not on file  . Food insecurity    Worry: Not on file    Inability: Not on file  . Transportation needs    Medical: Not on file    Non-medical: Not on file  Tobacco Use  . Smoking status: Former Research scientist (life sciences)  . Smokeless tobacco: Never Used  Substance and Sexual Activity  . Alcohol use: Not Currently  . Drug use: Not Currently  . Sexual activity: Not on file  Lifestyle  . Physical activity    Days per week: Not on file    Minutes per session: Not on file  . Stress: Not on file   Relationships  . Social Herbalist on phone: Not on file    Gets together: Not on file    Attends religious service: Not on file    Active member of club or organization: Not on file    Attends meetings of clubs or organizations: Not on file    Relationship status: Not on file  Other Topics Concern  . Not on file  Social History Narrative   Lives at home with his wife and children.   Right-handed.   2 cups caffeine per day.    Allergies: No Known Allergies  Metabolic Disorder Labs: No results found for: HGBA1C, MPG No results found for: PROLACTIN No results found for: CHOL, TRIG, HDL, CHOLHDL, VLDL, LDLCALC Lab Results  Component Value Date   TSH 2.430 06/10/2019   TSH 1.87 06/01/2019    Therapeutic Level Labs: No results found for: LITHIUM No results found for: VALPROATE No components found for:  CBMZ  Current Medications: Current Outpatient Medications  Medication Sig Dispense Refill  . lamoTRIgine (LAMICTAL) 100 MG tablet Take one tablet every morning. 30 tablet 11  . LamoTRIgine 300 MG TB24 24 hour tablet Take 1 tablet (300 mg total) by mouth at bedtime. 30 tablet 11  . sertraline (ZOLOFT) 100 MG tablet Take 1.5 tablets (150 mg total) by mouth daily. 135 tablet 0   No current facility-administered medications for this visit.      Musculoskeletal: Strength & Muscle Tone: N/A Gait & Station: N/A Patient leans: N/A  Psychiatric Specialty Exam: Review of Systems  Psychiatric/Behavioral: Positive for depression. Negative for hallucinations, memory loss, substance abuse and suicidal ideas. The patient is not nervous/anxious and does not have insomnia.   All other systems reviewed and are negative.   There were no vitals taken for this visit.There is no height or weight on file to calculate BMI.  General Appearance: Fairly Groomed  Eye Contact:  Good  Speech:  Clear and Coherent  Volume:  Normal  Mood:  "good"  Affect:  Appropriate, Congruent and  calmer, brighter  Thought Process:  Coherent  Orientation:  Full (Time, Place, and Person)  Thought Content: Logical   Suicidal Thoughts:  No  Homicidal Thoughts:  No  Memory:  Immediate;   Good  Judgement:  Good  Insight:  Good  Psychomotor Activity:  Normal  Concentration:  Concentration: Good and Attention Span: Good  Recall:  Good  Fund of Knowledge: Good  Language: Good  Akathisia:  No  Handed:  Right  AIMS (if indicated): not done  Assets:  Communication Skills Desire for Improvement  ADL's:  Intact  Cognition: WNL  Sleep:  good   Screenings:   Assessment and  Plan:  Dale Bender is a 48 y.o. year old male with a history of depression, complex partial seizure, who presents for follow up appointment for MDD (major depressive disorder), recurrent episode, mild ([place])  # MDD, mild, single episode without psychotic features Exam is notable for calmer affect and there has been significant improvement in depressive symptoms since uptitration of sertraline.  Psychosocial stressors includes demoralization from seizure episodes, and right leg weakness.  Will continue current dose of sertraline to target depression.  Discussed behavioral activation.  He is encouraged to continue therapy.  Of note, this examiner received a paperwork to file for long term disability. It is discussed with the patient that it would be the best for this paperwork to be filled by his provider for his medical condition given his mood symptoms are mild in severity. He verbalized understanding.   Plan 1. Continue sertraline 150 mg daily  2. Return in two months 3. Reviewed TSH ; wnl  The patient demonstrates the following risk factors for suicide: Chronic risk factors for suicide include: psychiatric disorder of depression. Acute risk factors for suicide include: loss (financial, interpersonal, professional). Protective factors for this patient include: positive social support, responsibility to others  (children, family), coping skills and hope for the future. Considering these factors, the overall suicide risk at this point appears to be low. Patient is appropriate for outpatient follow up.   [name], MD 06/24/2019, 12:03 PM

## 2019-06-17 NOTE — Procedures (Signed)
   HISTORY: 48 year old male with history of obstructive sleep apnea, seizure-like activity.  TECHNIQUE:  This is a routine 16 channel EEG recording with one channel devoted to a limited EKG recording.  It was performed during wakefulness, drowsiness and asleep.  Hyperventilation and photic stimulation were performed as activating procedures.  There are minimum muscle and movement artifact noted.  Upon maximum arousal, posterior dominant waking rhythm consistent of rhythmic alpha range activity, with frequency of 10 hz. Activities are symmetric over the bilateral posterior derivations and attenuated with eye opening.  Hyperventilation produced mild/moderate buildup with higher amplitude and the slower activities noted.  Photic stimulation did not alter the tracing.  During EEG recording, patient developed drowsiness and no deeper stage of sleep was achieved During EEG recording, there was no epileptiform discharge noted.  EKG demonstrate sinus rhythm, with heart rate of 84 bpm  CONCLUSION: This is a  normal awake EEG.  There is no electrodiagnostic evidence of epileptiform discharge.  Marcial Pacas, M.D. Ph.D.  [facility] [place], [place] [address] Phone: [phone] Fax:      [phone]

## 2019-06-20 ENCOUNTER — Ambulatory Visit (INDEPENDENT_AMBULATORY_CARE_PROVIDER_SITE_OTHER): Payer: Medicaid Other | Admitting: Neurology

## 2019-06-20 DIAGNOSIS — R400 Somnolence: Secondary | ICD-10-CM

## 2019-06-20 DIAGNOSIS — G4720 Circadian rhythm sleep disorder, unspecified type: Secondary | ICD-10-CM

## 2019-06-20 DIAGNOSIS — G4733 Obstructive sleep apnea (adult) (pediatric): Secondary | ICD-10-CM | POA: Diagnosis not present

## 2019-06-20 DIAGNOSIS — G40909 Epilepsy, unspecified, not intractable, without status epilepticus: Secondary | ICD-10-CM

## 2019-06-20 DIAGNOSIS — R519 Headache, unspecified: Secondary | ICD-10-CM

## 2019-06-20 DIAGNOSIS — G4731 Primary central sleep apnea: Secondary | ICD-10-CM

## 2019-06-20 DIAGNOSIS — E669 Obesity, unspecified: Secondary | ICD-10-CM

## 2019-06-20 DIAGNOSIS — R351 Nocturia: Secondary | ICD-10-CM

## 2019-06-24 ENCOUNTER — Ambulatory Visit (INDEPENDENT_AMBULATORY_CARE_PROVIDER_SITE_OTHER): Payer: Medicaid Other | Admitting: Psychiatry

## 2019-06-24 ENCOUNTER — Encounter (HOSPITAL_COMMUNITY): Payer: Self-pay | Admitting: Psychiatry

## 2019-06-24 ENCOUNTER — Other Ambulatory Visit: Payer: Self-pay

## 2019-06-24 DIAGNOSIS — F330 Major depressive disorder, recurrent, mild: Secondary | ICD-10-CM | POA: Diagnosis not present

## 2019-06-24 MED ORDER — SERTRALINE HCL 100 MG PO TABS: 150.0000 mg | ORAL_TABLET | Freq: Every day | ORAL | 0 refills | Status: DC

## 2019-06-24 NOTE — Patient Instructions (Signed)
1. Continue sertraline 150 mg daily  2. Return in two months

## 2019-07-03 ENCOUNTER — Other Ambulatory Visit: Payer: Self-pay

## 2019-07-03 ENCOUNTER — Ambulatory Visit
Admission: RE | Admit: 2019-07-03 | Discharge: 2019-07-03 | Disposition: A | Discharge status: Home / Self Care | Payer: Medicaid Other | Source: Ambulatory Visit | Attending: Neurology | Admitting: Neurology

## 2019-07-03 DIAGNOSIS — M6281 Muscle weakness (generalized): Secondary | ICD-10-CM

## 2019-07-05 ENCOUNTER — Telehealth: Payer: Self-pay | Admitting: Neurology

## 2019-07-05 ENCOUNTER — Telehealth: Payer: Self-pay

## 2019-07-05 NOTE — Telephone Encounter (Addendum)
I called pt, spoke to pt's wife [name], per DPR. I advised pt's wife that Dr. Rexene Alberts reviewed pt's sleep study results and found that has severe osa and recommends that pt be treated with a cpap. Dr. Rexene Alberts recommends that pt return for a repeat sleep study in order to properly titrate the cpap and ensure a good mask fit. Pt's wife is agreeable to pt returning for a titration study. I advised pt's wife that our sleep lab will file with pt's insurance and call pt to schedule the sleep study when we hear back from the pt's insurance regarding coverage of this sleep study. Pt's wife verbalized understanding of results. Pt's wife had no questions at this time but was encouraged to call back if questions arise.

## 2019-07-05 NOTE — Addendum Note (Signed)
Addended by: Star Age on: 07/05/2019 08:29 AM   Modules accepted: Orders

## 2019-07-05 NOTE — Progress Notes (Signed)
Patient referred by Dr. Bronson Ing, seen by me on 05/26/19, diagnostic PSG on 06/20/19.   Please call and notify the patient that the recent sleep study showed severe obstructive sleep apnea. I recommend treatment for this in the form of CPAP. This will require a repeat sleep study for proper titration and mask fitting and correct monitoring of the oxygen saturations. Please explain to patient. I have placed an order in the chart. Thanks.  Star Age, MD, PhD [facility])

## 2019-07-05 NOTE — Telephone Encounter (Signed)
I called patient regarding collecting $50 form fee for [employer] disability paperwork. I spoke with patient's wife, Dale Bender, and advised paperwork will not be filled out until form fee is paid. Patient's wife states she will let the patient know.

## 2019-07-05 NOTE — Procedures (Signed)
PATIENT'S NAME:  Dale Bender, Dale Bender DOB:      Aug 15, 1970      MR#:    [id number]     DATE OF RECORDING: 06/20/2019 REFERRING M.D.:  Dr. Kate Sable Study Performed:   Baseline Polysomnogram HISTORY: 48 year old man with a history of reflux disease, urethral stricture, seizure disorder, shortness of breath, and obesity, who reports snoring and excessive daytime somnolence. The patient endorsed the Epworth Sleepiness Scale at 22 points. The patient's weight 249 pounds with a height of 74 (inches), resulting in a BMI of 32. kg/m2. The patient's neck circumference measured 18 inches.  CURRENT MEDICATIONS: Lamictal, Lamotrigine, Zoloft   PROCEDURE:  This is a multichannel digital polysomnogram utilizing the Somnostar 11.2 system.  Electrodes and sensors were applied and monitored per AASM Specifications.   EEG, EOG, Chin and Limb EMG, were sampled at 200 Hz.  ECG, Snore and Nasal Pressure, Thermal Airflow, Respiratory Effort, CPAP Flow and Pressure, Oximetry was sampled at 50 Hz. Digital video and audio were recorded.      BASELINE STUDY  Lights Out was at 21:43 and Lights On at 05:00.  Total recording time (TRT) was 438 minutes, with a total sleep time (TST) of 356 minutes.   The patient's sleep latency to persistent sleep was delayed at 35 minutes.  REM latency was 73 minutes, which is normal. The sleep efficiency was 81.3 %.     SLEEP ARCHITECTURE: WASO (Wake after sleep onset) was 64 minutes with moderate sleep fragmentation noted. There were 130.5 minutes in Stage N1, 114.5 minutes Stage N2, 38.5 minutes Stage N3 and 72.5 minutes in Stage REM.  The percentage of Stage N1 was 36.7%, which is markedly increased, Stage N2 was 32.2%, Stage N3 was 10.8% and Stage R (REM sleep) was 20.4%, which is normal.   RESPIRATORY ANALYSIS:  There were a total of 467 respiratory events:  20 obstructive apneas, 109 central apneas and 212 mixed apneas with a total of 341 apneas and an apnea index (AI) of 57.5 /hour.  There were 126 hypopneas with a hypopnea index of 21.2 /hour. The patient also had 0 respiratory event related arousals (RERAs).      The total APNEA/HYPOPNEA INDEX (AHI) was 78.7 /hour and the total RESPIRATORY DISTURBANCE INDEX was 0. 78.7 /hour.  115 events occurred in REM sleep and 439 events in NREM. The REM AHI was 95.2 /hour, versus a non-REM AHI of 74.5. The patient spent 33 minutes of total sleep time in the supine position and 323 minutes in non-supine.. The supine AHI was 87.3 versus a non-supine AHI of 77.8.  OXYGEN SATURATION & C02:  The Wake baseline 02 saturation was 98%, with the lowest being 69%. Time spent below 89% saturation equaled 248 minutes.  PERIODIC LIMB MOVEMENTS: The patient had a total of 9 Periodic Limb Movements.  The Periodic Limb Movement (PLM) index was 1.5 and the PLM Arousal index was .3/hour. The arousals were noted as: 19 were spontaneous, 2 were associated with PLMs, 324 were associated with respiratory events. Audio and video analysis did not show any abnormal or unusual movements, behaviors, phonations or vocalizations. The patient took 1 bathroom break. Moderate to loud snoring was noted. The EKG was in keeping with normal sinus rhythm (NSR).  Post-study, the patient indicated that sleep was the same as usual.   IMPRESSION:  1. Severe Obstructive Sleep Apnea (OSA) 2. Central sleep apnea 3. Dysfunctions associated with sleep stages or arousal from sleep  RECOMMENDATIONS:  1. This study demonstrates severe  obstructive sleep apnea, with a central component, with a total AHI of 78.7/hour, REM AHI of 95.2/hour, and O2 nadir of 69%. Treatment with positive airway pressure in the form of CPAP is recommended. This will require a full night titration study to optimize therapy. Due to central apneas and mixed apneas noted, he may benefit from BiPAP or need BiPAP ST. Other treatment options may be limited due to the severity of his OSA. Generally speaking, other  treatment options may include avoidance of supine sleep position along with weight loss, upper airway or jaw surgery in selected patients or the use of an oral appliance in certain patients. ENT evaluation and/or consultation with a maxillofacial surgeon or dentist may be feasible in some instances.    2. Please note that untreated obstructive sleep apnea may carry additional perioperative morbidity. Patients with significant obstructive sleep apnea should receive perioperative PAP therapy and the surgeons and particularly the anesthesiologist should be informed of the diagnosis and the severity of the sleep disordered breathing. 3. This study shows sleep fragmentation and abnormal sleep stage percentages; these are nonspecific findings and per se do not signify an intrinsic sleep disorder or a cause for the patient's sleep-related symptoms. Causes include (but are not limited to) the first night effect of the sleep study, circadian rhythm disturbances, medication effect or an underlying mood disorder or medical problem.  4. The patient should be cautioned not to drive, work at heights, or operate dangerous or heavy equipment when tired or sleepy. Review and reiteration of good sleep hygiene measures should be pursued with any patient. 5. The patient will be seen in follow-up in the sleep clinic at [facility] for discussion of the test results, symptom and treatment compliance review, further management strategies, etc. The referring provider will be notified of the test results.  I certify that I have reviewed the entire raw data recording prior to the issuance of this report in accordance with the Standards of Accreditation of the American Academy of Sleep Medicine (AASM)  Star Age, MD, PhD Diplomat, American Board of Neurology and Sleep Medicine (Neurology and Sleep Medicine)

## 2019-07-05 NOTE — Telephone Encounter (Signed)
-----   Message from Star Age, MD sent at 07/05/2019  8:29 AM EST ----- Patient referred by Dr. Bronson Ing, seen by me on 05/26/19, diagnostic PSG on 06/20/19.   Please call and notify the patient that the recent sleep study showed severe obstructive sleep apnea. I recommend treatment for this in the form of CPAP. This will require a repeat sleep study for proper titration and mask fitting and correct monitoring of the oxygen saturations. Please explain to patient. I have placed an order in the chart. Thanks.  Star Age, MD, PhD [facility])

## 2019-07-06 ENCOUNTER — Telehealth: Payer: Self-pay | Admitting: Neurology

## 2019-07-06 NOTE — Telephone Encounter (Signed)
I talk with the wife, the MRI of the cervical and thoracic spines do not show any etiology for right leg numbness or problems with walking.  The spinal cord appears to be normal throughout.   MRI cervical 07/05/19:  IMPRESSION: This MRI of the cervical spine shows the following: 1.   At C3-C4, there are degenerative changes causing moderate foraminal narrowing bilaterally with some encroachment upon the C4 nerve roots. 2.   At C5-C6, there is moderate spinal stenosis with left greater than right foraminal narrowing and some encroachment upon the left C6 nerve root. 3.   At C6-C7, there is moderate spinal stenosis due to central disc herniation and other degenerative changes but no nerve root compression. 4.   The spinal cord appears normal.   MRI thoracic 07/05/19:  IMPRESSION: This MRI of the thoracic spine without contrast shows the following: 1.   The spinal cord appears normal.  No spinal stenosis is noted. 2.    Degenerative changes as detailed above causing right foraminal narrowing at C7-T1 and T2-T3 but no definite nerve root compression.

## 2019-07-13 ENCOUNTER — Other Ambulatory Visit: Payer: Self-pay

## 2019-07-13 ENCOUNTER — Other Ambulatory Visit (HOSPITAL_COMMUNITY)
Admission: RE | Admit: 2019-07-13 | Discharge: 2019-07-13 | Disposition: A | Discharge status: Home / Self Care | Payer: Medicaid Other | Source: Ambulatory Visit | Attending: Family Medicine | Admitting: Family Medicine

## 2019-07-13 DIAGNOSIS — Z20828 Contact with and (suspected) exposure to other viral communicable diseases: Secondary | ICD-10-CM | POA: Diagnosis present

## 2019-07-13 LAB — SARS CORONAVIRUS 2 (TAT 6-24 HRS): SARS Coronavirus 2: NEGATIVE

## 2019-07-16 ENCOUNTER — Ambulatory Visit (INDEPENDENT_AMBULATORY_CARE_PROVIDER_SITE_OTHER): Payer: Medicaid Other | Admitting: Neurology

## 2019-07-16 ENCOUNTER — Other Ambulatory Visit: Payer: Self-pay

## 2019-07-16 DIAGNOSIS — R400 Somnolence: Secondary | ICD-10-CM

## 2019-07-16 DIAGNOSIS — Z9989 Dependence on other enabling machines and devices: Secondary | ICD-10-CM

## 2019-07-16 DIAGNOSIS — R351 Nocturia: Secondary | ICD-10-CM

## 2019-07-16 DIAGNOSIS — G4731 Primary central sleep apnea: Secondary | ICD-10-CM

## 2019-07-16 DIAGNOSIS — G4733 Obstructive sleep apnea (adult) (pediatric): Secondary | ICD-10-CM | POA: Diagnosis not present

## 2019-07-16 DIAGNOSIS — G40909 Epilepsy, unspecified, not intractable, without status epilepticus: Secondary | ICD-10-CM

## 2019-07-16 DIAGNOSIS — G4720 Circadian rhythm sleep disorder, unspecified type: Secondary | ICD-10-CM

## 2019-07-16 DIAGNOSIS — E669 Obesity, unspecified: Secondary | ICD-10-CM

## 2019-07-16 DIAGNOSIS — R519 Headache, unspecified: Secondary | ICD-10-CM

## 2019-07-28 ENCOUNTER — Other Ambulatory Visit: Payer: Self-pay

## 2019-07-28 ENCOUNTER — Encounter: Payer: Medicaid Other | Admitting: Neurology

## 2019-07-28 ENCOUNTER — Ambulatory Visit (INDEPENDENT_AMBULATORY_CARE_PROVIDER_SITE_OTHER): Payer: Medicaid Other

## 2019-07-28 DIAGNOSIS — R0600 Dyspnea, unspecified: Secondary | ICD-10-CM

## 2019-07-28 DIAGNOSIS — R0609 Other forms of dyspnea: Secondary | ICD-10-CM

## 2019-07-29 ENCOUNTER — Ambulatory Visit (HOSPITAL_COMMUNITY): Payer: Medicaid Other | Admitting: Clinical

## 2019-07-29 ENCOUNTER — Telehealth (HOSPITAL_COMMUNITY): Payer: Self-pay | Admitting: Clinical

## 2019-07-29 NOTE — Telephone Encounter (Signed)
The OPT therapist sent out text for video session, but there was no response. Called patient at 11:10am the patient didnt answer and the OPT Therapist left a VM.

## 2019-07-30 ENCOUNTER — Telehealth (HOSPITAL_COMMUNITY): Payer: Self-pay | Admitting: Clinical

## 2019-07-30 ENCOUNTER — Ambulatory Visit (INDEPENDENT_AMBULATORY_CARE_PROVIDER_SITE_OTHER): Payer: Medicaid Other | Admitting: Clinical

## 2019-07-30 ENCOUNTER — Other Ambulatory Visit: Payer: Self-pay

## 2019-07-30 DIAGNOSIS — F330 Major depressive disorder, recurrent, mild: Secondary | ICD-10-CM

## 2019-07-30 NOTE — Telephone Encounter (Signed)
The 9:00AM client did not show for appointment. The OPT therapist attempted contact sending request via doxy me text request x2 with no response from 9:00AM-9:15AM. Please have the client reschedule, and note this is 2nd day in a row client has missed appointment.

## 2019-07-30 NOTE — Telephone Encounter (Signed)
Pt called with updated phone number

## 2019-07-30 NOTE — Progress Notes (Deleted)
[facility] MD/PA/NP OP Progress Note  07/30/2019 9:44 AM AUTHER CHAPLA  MRN:  [id number]  Chief Complaint:  HPI: *** Visit Diagnosis: No diagnosis found.  Past Psychiatric History: ***  Past Medical History:  Past Medical History:  Diagnosis Date  . GERD (gastroesophageal reflux disease)   . Prostate nodule    benign  . Seizure ([place])   . 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 FRACTURE SURGERY     LT arm, LT leg, Rt leg  . PROSTATE BIOPSY    . URETHRAL STRICTURE DILATATION      Family Psychiatric History: ***  Family History:  Family History  Problem Relation Age of Onset  . Hypertension Mother   . Diabetes Mother   . Other Father        died at age 85 in accident - log fell on him  . Kidney cancer Maternal Aunt   . Prostate cancer Paternal Uncle     Social History:  Social History   Socioeconomic History  . Marital status: Married    Spouse name: Not on file  . Number of children: 2  . Years of education: 10th grade  . Highest education level: Not on file  Occupational History  . Occupation: tow Geophysical data processor  Tobacco Use  . Smoking status: Former Research scientist (life sciences)  . Smokeless tobacco: Never Used  Substance and Sexual Activity  . Alcohol use: Not Currently  . Drug use: Not Currently  . Sexual activity: Not on file  Other Topics Concern  . Not on file  Social History Narrative   Lives at home with his wife and children.   Right-handed.   2 cups caffeine per day.   Social Determinants of Health   Financial Resource Strain:   . Difficulty of Paying Living Expenses: Not on file  Food Insecurity:   . Worried About Charity fundraiser in the Last Year: Not on file  . Ran Out of Food in the Last Year: Not on file  Transportation Needs:   . Lack of Transportation (Medical): Not on file  . Lack of Transportation (Non-Medical): Not on file  Physical Activity:   . Days of Exercise per Week: Not on file  . Minutes of  Exercise per Session: Not on file  Stress:   . Feeling of Stress : Not on file  Social Connections:   . Frequency of Communication with Friends and Family: Not on file  . Frequency of Social Gatherings with Friends and Family: Not on file  . Attends Religious Services: Not on file  . Active Member of Clubs or Organizations: Not on file  . Attends Archivist Meetings: Not on file  . Marital Status: Not on file    Allergies: No Known Allergies  Metabolic Disorder Labs: No results found for: HGBA1C, MPG No results found for: PROLACTIN No results found for: CHOL, TRIG, HDL, CHOLHDL, VLDL, LDLCALC Lab Results  Component Value Date   TSH 2.430 06/10/2019   TSH 1.87 06/01/2019    Therapeutic Level Labs: No results found for: LITHIUM No results found for: VALPROATE No components found for:  CBMZ  Current Medications: Current Outpatient Medications  Medication Sig Dispense Refill  . lamoTRIgine (LAMICTAL) 100 MG tablet Take one tablet every morning. 30 tablet 11  . LamoTRIgine 300 MG TB24 24 hour tablet Take 1 tablet (300 mg total) by mouth at bedtime. 30 tablet 11  . sertraline (ZOLOFT) 100  MG tablet Take 1.5 tablets (150 mg total) by mouth daily. 135 tablet 0   No current facility-administered medications for this visit.     Musculoskeletal: Strength & Muscle Tone: {desc; muscle tone:32375} Gait & Station: {PE GAIT ED EF:[id number] Patient leans: {Patient Leans:21022755}  Psychiatric Specialty Exam: Review of Systems  There were no vitals taken for this visit.There is no height or weight on file to calculate BMI.  General Appearance: {Appearance:22683}  Eye Contact:  {BHH EYE CONTACT:22684}  Speech:  {Speech:22685}  Volume:  {Volume (PAA):22686}  Mood:  {BHH MOOD:22306}  Affect:  {Affect (PAA):22687}  Thought Process:  {Thought Process (PAA):22688}  Orientation:  {BHH ORIENTATION (PAA):22689}  Thought Content: {Thought Content:22690}   Suicidal Thoughts:   {ST/HT (PAA):22692}  Homicidal Thoughts:  {ST/HT (PAA):22692}  Memory:  {BHH MEMORY:22881}  Judgement:  {Judgement (PAA):22694}  Insight:  {Insight (PAA):22695}  Psychomotor Activity:  {Psychomotor (PAA):22696}  Concentration:  {Concentration:21399}  Recall:  {BHH GOOD/FAIR/POOR:22877}  Fund of Knowledge: {BHH GOOD/FAIR/POOR:22877}  Language: {BHH GOOD/FAIR/POOR:22877}  Akathisia:  {BHH YES OR NO:22294}  Handed:  {Handed:22697}  AIMS (if indicated): {Desc; done/not:10129}  Assets:  {Assets (PAA):22698}  ADL's:  {BHH XO:4411959  Cognition: {chl bhh cognition:304700322}  Sleep:  {BHH GOOD/FAIR/POOR:22877}   Screenings:   Assessment and Plan: ***   [name], LCSW 07/30/2019, 9:44 AM

## 2019-07-30 NOTE — Progress Notes (Signed)
Virtual Visit via Telephone Note  I connected with Dale Bender on 07/30/19 at  9:00 AM EST by telephone and verified that I am speaking with the correct person using two identifiers.  Location: Patient: Home Provider: Office   I discussed the limitations, risks, security and privacy concerns of performing an evaluation and management service by telephone and the availability of in person appointments. I also discussed with the patient that there may be a patient responsible charge related to this service. The patient expressed understanding and agreed to proceed.        THERAPIST PROGRESS NOTE  Session Time: 9:20AM-10:00AM  Participation Level: Active  Behavioral Response: CasualAlertDepressed  Type of Therapy: Individual Therapy  Treatment Goals addressed: Coping  Interventions: CBT  Summary: Dale Bender is a 48 y.o. male who presents with MDD. The OPT therapist met with the client for his scheduled initial OPT session. The OP therapist worked with the patient utilizing Motivational Interviewing and working to establish theraputic repore. The patient in this session worked with the [place] therapist giving his feedback about the past week in relation to his triggers and mood symptoms. The OPT therapist utilized Cognitive Behavioral Therapy cognitive restructuring to assist in reviewing with the client his thoughts,feelings, and behaviors associated with an identified triggering episode. The OPT therapist gave encouragement and worked with the patient reviewing that management of his symptoms may be difficulty, but worked with the patient to empower him to be active in his treatment to help reducing the impact of his symptoms on his functioning.   Suicidal/Homicidal: Nowithout intent/plan  Therapist Response:  The OPT therapist met with the patient for his scheduled OPT session. The client in this session worked with the [place] therapist and was oriented throughout the session.  The patient gave his verbal feedback highlighting his physical limitation and the impact of this on his mental health as a triggering factor for his Depression symptoms. The patient was involved and completed a in session Cognitive Behavioral Therapy exercise. The patient verbalized intent to work to implement coping strategy reviewed with the OPT therapist before his next scheduled session. The OPT therapist will continue treatment work with the patient in his next scheduled session.   Plan: Return again in 2 weeks.  Diagnosis: Axis I: Major Depression, Recurrent severe    Axis II: No diagnosis  I discussed the assessment and treatment plan with the patient. The patient was provided an opportunity to ask questions and all were answered. The patient agreed with the plan and demonstrated an understanding of the instructions.   The patient was advised to call back or seek an in-person evaluation if the symptoms worsen or if the condition fails to improve as anticipated.  I provided 40 minutes of non-face-to-face time during this encounter.    Dale Grumbles, LCSW 07/30/2019

## 2019-08-05 ENCOUNTER — Observation Stay (HOSPITAL_COMMUNITY)
Admission: EM | Admit: 2019-08-05 | Discharge: 2019-08-06 | Disposition: A | Discharge status: Home / Self Care | Payer: Medicaid Other | Attending: Internal Medicine | Admitting: Internal Medicine

## 2019-08-05 ENCOUNTER — Other Ambulatory Visit: Payer: Self-pay

## 2019-08-05 ENCOUNTER — Encounter (HOSPITAL_COMMUNITY): Payer: Self-pay | Admitting: *Deleted

## 2019-08-05 ENCOUNTER — Emergency Department (HOSPITAL_COMMUNITY): Payer: Medicaid Other

## 2019-08-05 DIAGNOSIS — E872 Acidosis, unspecified: Secondary | ICD-10-CM

## 2019-08-05 DIAGNOSIS — R0603 Acute respiratory distress: Secondary | ICD-10-CM | POA: Diagnosis not present

## 2019-08-05 DIAGNOSIS — Z87891 Personal history of nicotine dependence: Secondary | ICD-10-CM | POA: Diagnosis not present

## 2019-08-05 DIAGNOSIS — Z9114 Patient's other noncompliance with medication regimen: Secondary | ICD-10-CM | POA: Insufficient documentation

## 2019-08-05 DIAGNOSIS — U071 COVID-19: Secondary | ICD-10-CM | POA: Diagnosis not present

## 2019-08-05 DIAGNOSIS — K219 Gastro-esophageal reflux disease without esophagitis: Secondary | ICD-10-CM | POA: Diagnosis not present

## 2019-08-05 DIAGNOSIS — E860 Dehydration: Secondary | ICD-10-CM | POA: Diagnosis not present

## 2019-08-05 DIAGNOSIS — R569 Unspecified convulsions: Secondary | ICD-10-CM

## 2019-08-05 DIAGNOSIS — Z79899 Other long term (current) drug therapy: Secondary | ICD-10-CM | POA: Diagnosis not present

## 2019-08-05 DIAGNOSIS — G40909 Epilepsy, unspecified, not intractable, without status epilepticus: Secondary | ICD-10-CM | POA: Diagnosis not present

## 2019-08-05 DIAGNOSIS — R0602 Shortness of breath: Secondary | ICD-10-CM

## 2019-08-05 DIAGNOSIS — N179 Acute kidney failure, unspecified: Secondary | ICD-10-CM | POA: Diagnosis not present

## 2019-08-05 LAB — BLOOD GAS, ARTERIAL
Acid-base deficit: 3 mmol/L — ABNORMAL HIGH (ref 0.0–2.0)
Bicarbonate: 22.3 mmol/L (ref 20.0–28.0)
FIO2: 21
O2 Saturation: 95.7 %
Patient temperature: 37.5
pCO2 arterial: 34.2 mmHg (ref 32.0–48.0)
pH, Arterial: 7.404 (ref 7.350–7.450)
pO2, Arterial: 81.7 mmHg — ABNORMAL LOW (ref 83.0–108.0)

## 2019-08-05 LAB — COMPREHENSIVE METABOLIC PANEL
ALT: 46 U/L — ABNORMAL HIGH (ref 0–44)
AST: 36 U/L (ref 15–41)
Albumin: 4.6 g/dL (ref 3.5–5.0)
Alkaline Phosphatase: 71 U/L (ref 38–126)
Anion gap: 14 (ref 5–15)
BUN: 12 mg/dL (ref 6–20)
CO2: 22 mmol/L (ref 22–32)
Calcium: 8.7 mg/dL — ABNORMAL LOW (ref 8.9–10.3)
Chloride: 101 mmol/L (ref 98–111)
Creatinine, Ser: 1.47 mg/dL — ABNORMAL HIGH (ref 0.61–1.24)
GFR calc Af Amer: >60 mL/min (ref >60)
GFR calc non Af Amer: 55 mL/min — ABNORMAL LOW (ref >60)
Glucose, Bld: 114 mg/dL — ABNORMAL HIGH (ref 70–99)
Potassium: 3.4 mmol/L — ABNORMAL LOW (ref 3.5–5.1)
Sodium: 137 mmol/L (ref 135–145)
Total Bilirubin: 0.8 mg/dL (ref 0.3–1.2)
Total Protein: 7.4 g/dL (ref 6.5–8.1)

## 2019-08-05 LAB — CBC WITH DIFFERENTIAL/PLATELET
Abs Immature Granulocytes: 0.03 10*3/uL (ref 0.00–0.07)
Basophils Absolute: 0 10*3/uL (ref 0.0–0.1)
Basophils Relative: 0 %
Eosinophils Absolute: 0 10*3/uL (ref 0.0–0.5)
Eosinophils Relative: 0 %
HCT: 48.1 % (ref 39.0–52.0)
Hemoglobin: 15.6 g/dL (ref 13.0–17.0)
Immature Granulocytes: 0 %
Lymphocytes Relative: 42 %
Lymphs Abs: 3.8 10*3/uL (ref 0.7–4.0)
MCH: 29.4 pg (ref 26.0–34.0)
MCHC: 32.4 g/dL (ref 30.0–36.0)
MCV: 90.6 fL (ref 80.0–100.0)
Monocytes Absolute: 1 10*3/uL (ref 0.1–1.0)
Monocytes Relative: 11 %
Neutro Abs: 4.1 10*3/uL (ref 1.7–7.7)
Neutrophils Relative %: 47 %
Platelets: 185 10*3/uL (ref 150–400)
RBC: 5.31 MIL/uL (ref 4.22–5.81)
RDW: 12.6 % (ref 11.5–15.5)
WBC: 8.9 10*3/uL (ref 4.0–10.5)
nRBC: 0 % (ref 0.0–0.2)

## 2019-08-05 LAB — I-STAT CHEM 8, ED
BUN: 11 mg/dL (ref 6–20)
Calcium, Ion: 1.19 mmol/L (ref 1.15–1.40)
Chloride: 102 mmol/L (ref 98–111)
Creatinine, Ser: 1.4 mg/dL — ABNORMAL HIGH (ref 0.61–1.24)
Glucose, Bld: 113 mg/dL — ABNORMAL HIGH (ref 70–99)
HCT: 49 % (ref 39.0–52.0)
Hemoglobin: 16.7 g/dL (ref 13.0–17.0)
Potassium: 3.7 mmol/L (ref 3.5–5.1)
Sodium: 142 mmol/L (ref 135–145)
TCO2: 23 mmol/L (ref 22–32)

## 2019-08-05 LAB — PROCALCITONIN: Procalcitonin: <0.1 ng/mL

## 2019-08-05 LAB — LACTATE DEHYDROGENASE: LDH: 225 U/L — ABNORMAL HIGH (ref 98–192)

## 2019-08-05 LAB — CK: Total CK: 219 U/L (ref 49–397)

## 2019-08-05 LAB — D-DIMER, QUANTITATIVE: D-Dimer, Quant: 0.37 ug/mL-FEU (ref 0.00–0.50)

## 2019-08-05 LAB — LACTIC ACID, PLASMA
Lactic Acid, Venous: 6.4 mmol/L (ref 0.5–1.9)
Lactic Acid, Venous: 2 mmol/L (ref 0.5–1.9)

## 2019-08-05 LAB — POC SARS CORONAVIRUS 2 AG -  ED: SARS Coronavirus 2 Ag: POSITIVE — AB

## 2019-08-05 LAB — FERRITIN: Ferritin: 358 ng/mL — ABNORMAL HIGH (ref 24–336)

## 2019-08-05 LAB — TRIGLYCERIDES: Triglycerides: 187 mg/dL — ABNORMAL HIGH (ref <150)

## 2019-08-05 LAB — FIBRINOGEN: Fibrinogen: 502 mg/dL — ABNORMAL HIGH (ref 210–475)

## 2019-08-05 LAB — C-REACTIVE PROTEIN: CRP: 2.9 mg/dL — ABNORMAL HIGH (ref <1.0)

## 2019-08-05 LAB — MAGNESIUM: Magnesium: 2 mg/dL (ref 1.7–2.4)

## 2019-08-05 LAB — TROPONIN I (HIGH SENSITIVITY): Troponin I (High Sensitivity): 2 ng/L (ref <18)

## 2019-08-05 MED ORDER — LAMOTRIGINE 25 MG PO TABS
100.0000 mg | ORAL_TABLET | Freq: Every morning | ORAL | Status: DC
  Administered 2019-08-06: 10:00:00 100 mg via ORAL
  Filled 2019-08-05: qty 4

## 2019-08-05 MED ORDER — ENOXAPARIN SODIUM 40 MG/0.4ML ~~LOC~~ SOLN
40.0000 mg | SUBCUTANEOUS | Status: DC
  Administered 2019-08-06: 40 mg via SUBCUTANEOUS
  Filled 2019-08-05: qty 0.4

## 2019-08-05 MED ORDER — LORAZEPAM 2 MG/ML IJ SOLN: 2.0000 mg | INTRAMUSCULAR | Status: DC | PRN

## 2019-08-05 MED ORDER — ONDANSETRON HCL 4 MG/2ML IJ SOLN: 4.0000 mg | Freq: Four times a day (QID) | INTRAMUSCULAR | Status: DC | PRN

## 2019-08-05 MED ORDER — LEVETIRACETAM IN NACL 1000 MG/100ML IV SOLN
1000.0000 mg | Freq: Once | INTRAVENOUS | Status: AC
  Administered 2019-08-05: 1000 mg via INTRAVENOUS
  Filled 2019-08-05: qty 100

## 2019-08-05 MED ORDER — SODIUM CHLORIDE 0.9 % IV BOLUS
1000.0000 mL | Freq: Once | INTRAVENOUS | Status: AC
  Administered 2019-08-05: 1000 mL via INTRAVENOUS

## 2019-08-05 MED ORDER — ALBUTEROL SULFATE HFA 108 (90 BASE) MCG/ACT IN AERS
2.0000 | INHALATION_SPRAY | Freq: Four times a day (QID) | RESPIRATORY_TRACT | Status: DC
  Administered 2019-08-06 (×2): 2 via RESPIRATORY_TRACT
  Filled 2019-08-05 (×2): qty 6.7

## 2019-08-05 MED ORDER — HYDROCOD POLST-CPM POLST ER 10-8 MG/5ML PO SUER: 5.0000 mL | Freq: Two times a day (BID) | ORAL | Status: DC | PRN

## 2019-08-05 MED ORDER — ZINC SULFATE 220 (50 ZN) MG PO CAPS
220.0000 mg | ORAL_CAPSULE | Freq: Every day | ORAL | Status: DC
  Administered 2019-08-06: 220 mg via ORAL
  Filled 2019-08-05: qty 1

## 2019-08-05 MED ORDER — TRAZODONE HCL 50 MG PO TABS: 25.0000 mg | ORAL_TABLET | Freq: Every evening | ORAL | Status: DC | PRN

## 2019-08-05 MED ORDER — LAMOTRIGINE ER 300 MG PO TB24: 300.0000 mg | ORAL_TABLET | Freq: Every day | ORAL | Status: DC

## 2019-08-05 MED ORDER — SODIUM CHLORIDE 0.9 % IV SOLN: INTRAVENOUS | Status: DC

## 2019-08-05 MED ORDER — ASCORBIC ACID 500 MG PO TABS
500.0000 mg | ORAL_TABLET | Freq: Every day | ORAL | Status: DC
  Administered 2019-08-06: 500 mg via ORAL
  Filled 2019-08-05: qty 1

## 2019-08-05 MED ORDER — PANTOPRAZOLE SODIUM 40 MG PO TBEC
40.0000 mg | DELAYED_RELEASE_TABLET | Freq: Every day | ORAL | Status: DC
  Administered 2019-08-06: 40 mg via ORAL
  Filled 2019-08-05: qty 1

## 2019-08-05 MED ORDER — SERTRALINE HCL 50 MG PO TABS
150.0000 mg | ORAL_TABLET | Freq: Every day | ORAL | Status: DC
  Administered 2019-08-06: 150 mg via ORAL
  Filled 2019-08-05: qty 3

## 2019-08-05 MED ORDER — LORAZEPAM 2 MG/ML IJ SOLN: 2.0000 mg | Freq: Once | INTRAMUSCULAR | Status: DC | PRN

## 2019-08-05 MED ORDER — ONDANSETRON HCL 4 MG PO TABS: 4.0000 mg | ORAL_TABLET | Freq: Four times a day (QID) | ORAL | Status: DC | PRN

## 2019-08-05 MED ORDER — ACETAMINOPHEN 325 MG PO TABS: 650.0000 mg | ORAL_TABLET | Freq: Four times a day (QID) | ORAL | Status: DC | PRN

## 2019-08-05 MED ORDER — SENNOSIDES-DOCUSATE SODIUM 8.6-50 MG PO TABS
1.0000 | ORAL_TABLET | Freq: Every evening | ORAL | Status: DC | PRN
  Filled 2019-08-05: qty 1

## 2019-08-05 MED ORDER — DEXAMETHASONE 4 MG PO TABS
6.0000 mg | ORAL_TABLET | ORAL | Status: DC
  Administered 2019-08-06: 6 mg via ORAL
  Filled 2019-08-05: qty 2

## 2019-08-05 MED ORDER — LORAZEPAM 2 MG/ML IJ SOLN
INTRAMUSCULAR | Status: AC
  Filled 2019-08-05: qty 1

## 2019-08-05 MED ORDER — POTASSIUM CHLORIDE CRYS ER 20 MEQ PO TBCR
40.0000 meq | EXTENDED_RELEASE_TABLET | Freq: Once | ORAL | Status: AC
  Administered 2019-08-05: 40 meq via ORAL
  Filled 2019-08-05: qty 2

## 2019-08-05 MED ORDER — GUAIFENESIN-DM 100-10 MG/5ML PO SYRP: 10.0000 mL | ORAL_SOLUTION | ORAL | Status: DC | PRN

## 2019-08-05 NOTE — ED Notes (Signed)
Dale Bender- Wife- [phone]

## 2019-08-05 NOTE — ED Triage Notes (Addendum)
Pt brought in by [facility] with c/o unresponsiveness. EMS arrived with resp of 3 and GCS of 3 upon arrival. Respirations increased to 60 bpm. Pt was given 2 doses of Epi IM and 1 dose of Solumedrol IV. Pt is more alert and responding upon arrival to ED. Upon moving pt to ED stretcher, pt started seizing for about 2-3 minutes. EMS reports EKG NSR, HR 90, BP 134/80, O2 sat 100% on NRB.  Pt was confirmed Covid positive yesterday.

## 2019-08-05 NOTE — ED Provider Notes (Signed)
[facility] EMERGENCY DEPARTMENT Provider Note   CSN: [id number] Arrival date & time: 08/05/19  1320     History Chief Complaint  Patient presents with  . Unresponsive    Dale Bender is a 48 y.o. male.  HPI    Patient is a 48 year old male, known history of seizure disorder, who presents with acute distress due to ongoing seizure activity.  The patient has essentially been unresponsive earlier in the day according to the wife's report of the paramedics.  The wife is not here at the bedside to give any other history.  The paramedics report that they found the patient to be unresponsive, he was hypotensive, tachypneic, hypoxic and in route to the hospital had tonic-clonic seizure activity.  IV access was obtained, no medications were given, blood sugar was normal.  On arrival the patient seizure had stopped, he was rapidly tachypneic and hypoxic requiring a nonrebreather.  The patient was unable to answer any questions.  There was report that the patient had tested positive for coronavirus yesterday.  During the course of the resuscitation the patient added more history as he stopped seizing, came around and came back to his baseline mental status.  He reports that he has been short of breath all morning, he did not take his normal seizure medications, he has been very short of breath, febrile, diaphoretic and coughing with chest discomfort.  Symptoms have been persistent, there has been no diarrhea  Past Medical History:  Diagnosis Date  . GERD (gastroesophageal reflux disease)   . Prostate nodule    benign  . Seizure ([place])   . Urethral stricture in male     Patient Active Problem List   Diagnosis Date Noted  . Seizure disorder ([place]) 06/10/2019  . Right leg numbness 06/10/2019  . 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 Seizure-like activity ([place]) 07/22/2018  . Right leg weakness 07/22/2018  . Gait abnormality 07/22/2018  . GERD  (gastroesophageal reflux disease) 07/04/2018  . Hematuria 07/04/2018  . 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 FRACTURE SURGERY     LT arm, LT leg, Rt leg  . PROSTATE BIOPSY    . URETHRAL STRICTURE DILATATION         Family History  Problem Relation Age of Onset  . Hypertension Mother   . Diabetes Mother   . Other Father        died at age 83 in accident - log fell on him  . Kidney cancer Maternal Aunt   . Prostate cancer Paternal Uncle     Social History   Tobacco Use  . Smoking status: Former Research scientist (life sciences)  . Smokeless tobacco: Never Used  Substance Use Topics  . Alcohol use: Not Currently  . Drug use: Not Currently    Home Medications Prior to Admission medications   Medication Sig Start Date End Date Taking? Authorizing Provider  lamoTRIgine (LAMICTAL) 100 MG tablet Take one tablet every morning. 05/18/19  Yes Marcial Pacas, MD  LamoTRIgine 300 MG TB24 24 hour tablet Take 1 tablet (300 mg total) by mouth at bedtime. 04/08/19  Yes Marcial Pacas, MD  omeprazole (PRILOSEC) 40 MG capsule Take 40 mg by mouth daily. 07/14/19  Yes [provider]  sertraline (ZOLOFT) 100 MG tablet Take 1.5 tablets (150 mg total) by mouth daily. 06/24/19  Yes [name], MD    Allergies    Patient has no known allergies.  Review  of Systems   Review of Systems  All other systems reviewed and are negative.   Physical Exam Updated Vital Signs BP 119/75   Pulse (!) 107   Temp 99.5 F (37.5 C) (Oral)   Resp (!) 23   Ht 1.88 m (6\' 2" )   Wt 113.4 kg   SpO2 91%   BMI 32.10 kg/m   Physical Exam Vitals and nursing note reviewed.  Constitutional:      General: He is in acute distress.     Appearance: He is well-developed. He is ill-appearing and diaphoretic.  HENT:     Head: Normocephalic and atraumatic.     Mouth/Throat:     Pharynx: No oropharyngeal exudate.     Comments: Small amount of tongue biting present,  teeth appear normal Eyes:     General: No scleral icterus.       Right eye: No discharge.        Left eye: No discharge.     Conjunctiva/sclera: Conjunctivae normal.     Pupils: Pupils are equal, round, and reactive to light.  Neck:     Thyroid: No thyromegaly.     Vascular: No JVD.  Cardiovascular:     Rate and Rhythm: Regular rhythm. Tachycardia present.     Heart sounds: Normal heart sounds. No murmur. No friction rub. No gallop.   Pulmonary:     Effort: Respiratory distress present.     Breath sounds: Rales present. No wheezing.  Abdominal:     General: Bowel sounds are normal. There is no distension.     Palpations: Abdomen is soft. There is no mass.     Tenderness: There is no abdominal tenderness.  Musculoskeletal:        General: No tenderness. Normal range of motion.     Cervical back: Normal range of motion and neck supple.  Lymphadenopathy:     Cervical: No cervical adenopathy.  Skin:    General: Skin is warm.     Findings: No erythema or rash.  Neurological:     Mental Status: He is alert.     Coordination: Coordination normal.  Psychiatric:        Behavior: Behavior normal.     ED Results / Procedures / Treatments   Labs (all labs ordered are listed, but only abnormal results are displayed) Labs Reviewed  BLOOD GAS, ARTERIAL - Abnormal; Notable for the following components:      Result Value   pO2, Arterial 81.7 (*)    Acid-base deficit 3.0 (*)    All other components within normal limits  COMPREHENSIVE METABOLIC PANEL - Abnormal; Notable for the following components:   Potassium 3.4 (*)    Glucose, Bld 114 (*)    Creatinine, Ser 1.47 (*)    Calcium 8.7 (*)    ALT 46 (*)    GFR calc non Af Amer 55 (*)    All other components within normal limits  LACTIC ACID, PLASMA - Abnormal; Notable for the following components:   Lactic Acid, Venous 6.4 (*)    All other components within normal limits  LACTATE DEHYDROGENASE - Abnormal; Notable for the following  components:   LDH 225 (*)    All other components within normal limits  FERRITIN - Abnormal; Notable for the following components:   Ferritin 358 (*)    All other components within normal limits  TRIGLYCERIDES - Abnormal; Notable for the following components:   Triglycerides 187 (*)    All other components within normal  limits  FIBRINOGEN - Abnormal; Notable for the following components:   Fibrinogen 502 (*)    All other components within normal limits  C-REACTIVE PROTEIN - Abnormal; Notable for the following components:   CRP 2.9 (*)    All other components within normal limits  I-STAT CHEM 8, ED - Abnormal; Notable for the following components:   Creatinine, Ser 1.40 (*)    Glucose, Bld 113 (*)    All other components within normal limits  POC SARS CORONAVIRUS 2 AG -  ED - Abnormal; Notable for the following components:   SARS Coronavirus 2 Ag POSITIVE (*)    All other components within normal limits  CULTURE, BLOOD (ROUTINE X 2)  SARS CORONAVIRUS 2 (TAT 6-24 HRS)  CULTURE, BLOOD (ROUTINE X 2)  CK  CBC WITH DIFFERENTIAL/PLATELET  D-DIMER, QUANTITATIVE (NOT AT [facility])  PROCALCITONIN  LACTIC ACID, PLASMA  TROPONIN I (HIGH SENSITIVITY)    EKG EKG Interpretation  Date/Time:  Thursday August 05 2019 14:15:04 EST Ventricular Rate:  107 PR Interval:    QRS Duration: 92 QT Interval:  496 QTC Calculation: 662 R Axis:   52 Text Interpretation: Junctional tachycardia Prolonged QT interval Since last tracing rate faster Confirmed by Noemi Chapel [phone]) on 08/05/2019 2:45:28 PM   Radiology DG Chest Port 1 View  Result Date: 08/05/2019 CLINICAL DATA:  Cough and fever. Shortness of breath. COVID-19. EXAM: PORTABLE CHEST 1 VIEW COMPARISON:  07/03/2018 FINDINGS: The heart size and mediastinal contours are within normal limits. Both lungs are clear. Pulmonary vascularity is at the upper limits of normal. The visualized skeletal structures are unremarkable. IMPRESSION: No active  disease. Electronically Signed   By: Lorriane Shire M.D.   On: 08/05/2019 14:22    Procedures .Critical Care Performed by: Noemi Chapel, MD Authorized by: Noemi Chapel, MD   Critical care provider statement:    Critical care time (minutes):  35   Critical care time was exclusive of:  Separately billable procedures and treating other patients and teaching time   Critical care was necessary to treat or prevent imminent or life-threatening deterioration of the following conditions:  Respiratory failure and CNS failure or compromise   Critical care was time spent personally by me on the following activities:  Blood draw for specimens, development of treatment plan with patient or surrogate, discussions with consultants, evaluation of patient's response to treatment, examination of patient, obtaining history from patient or surrogate, ordering and performing treatments and interventions, ordering and review of laboratory studies, ordering and review of radiographic studies, pulse oximetry, re-evaluation of patient's condition and review of old charts   (including critical care time)  Medications Ordered in ED Medications  LORazepam (ATIVAN) 2 MG/ML injection (  Given by Other 08/05/19 1336)  levETIRAcetam (KEPPRA) IVPB 1000 mg/100 mL premix ( Intravenous Rate/Dose Verify 08/05/19 1454)    ED Course  I have reviewed the triage vital signs and the nursing notes.  Pertinent labs & imaging results that were available during my care of the patient were reviewed by me and considered in my medical decision making (see chart for details).  Clinical Course as of Aug 04 1502  Thu Aug 05, 2019  1421 I have personally viewed the chest x-ray, this is a poor inspiration, normal-appearing heart shadow, normal-appearing mediastinum, normal skin and soft tissues, no obvious infiltrate, no subdiaphragmatic air, unremarkable chest x-ray   [BM]  1422 CBC shows no leukocytosis, no anemia, normal platelets,  electrolytes are unremarkable except for creatinine of 1.4   [  BM]  1422 Compared to prior labs 1.4 his baseline for creatinine   [BM]  1422 The patient is about 92% on room air at this time, feeling dyspneic, after Ativan he has not had any further seizures   [BM]  1423 Keppra ordered as well is seizure precautions and a creatinine kinase   [BM]    Clinical Course User Index [BM] Noemi Chapel, MD   MDM Rules/Calculators/A&P                      The patient is ill-appearing, tachycardic, diaphoretic and in respiratory distress though on supplemental oxygen he is 100%.  After the seizure stopped his respiratory rate slowed, his oxygen improved and he is now on just 2 L by nasal cannula.  He does have some abnormal lung sounds and with the advent of the seizure and his shortness of breath he would likely need to be admitted, he is critically ill with respiratory failure, acute seizure activity.  Labs pending, chest x-ray pending, repeat Covid is we do not have a result in the system.  The patient has an improved status, he has not had any further seizures, he is still borderline tachycardic, his blood pressure has improved, his oxygen is approximately 91 to 92% on room air with a respiratory rate around 24 breaths/min.  I do not detect any infiltrates on his x-ray, his lactic acid of 6.4 is likely related to seizures and less likely related to underlying infection.  I doubt that he has sepsis given his lack of a fever, his lack of a source of infection other than Covid and the lack of infiltrates on the x-ray.  The patient will need to be admitted to the hospital, critically ill from what appears to be Covid related infection mixed with a decompensation in his neurologic function with a seizure prolonged with a postictal phase and increasing respiratory symptoms.  We will consult with the hospitalist  D/w Dr. Wynetta Emery will admit.  Final Clinical Impression(s) / ED Diagnoses Final diagnoses:   COVID-19  Seizure ([place])  Lactic acidosis      Noemi Chapel, MD 08/05/19 1504

## 2019-08-05 NOTE — ED Notes (Signed)
Pt awake and talking with EDP at this time.

## 2019-08-05 NOTE — H&P (Signed)
History and Physical  [place] [id number] DOB: 11-03-1970 DOA: 08/05/2019  PCP: [facility]  Patient coming from: Home by EMS  I have personally briefly reviewed patient's old medical records in [place]  Chief Complaint: unresponsive  HPI: Dale Bender is a 48 y.o. male with medical history significant of epilepsy where his wife reports that he had his last seizure approximately 1 month ago.  The wife reports he has not been taking his Lamictal as prescribed.  He normally takes it every evening.  He is supposed to take 1 dose in the morning (100 mg) and 1 dose in the evening (300 mg).  She reports that he misses a lot of the morning doses.  His last dose was taken last night before bed.  The patient had been having complaints of cough and fever for the past 3 days.  He was Covid tested at an outpatient clinic and received the positive test result yesterday.  His wife says she sent him to the emergency department because she was worried about his increasing shortness of breath.  Unfortunately when EMS arrived they found the patient to be unresponsive and hypotensive and he was having tonic-clonic seizure-like activity.  He was given 2 doses of epinephrine and Solu-Medrol IV.  He started to respond to these treatments.  His initial vitals were heart rate 90 sinus rhythm on EKG, BP 134/80, pulse ox 100% on nonrebreather.  ED Course: On arrival he was tachypneic and hypoxic and hypotensive and was placed on a nonrebreather.  He was still seizing.  He was given 2 mg of Ativan IV and this seemed to stop his seizure completely.  He began to awaken and his mentation started to improve.  He was taken off oxygen and was 92% on room air.  He did have some Rales on exam.  He was loaded with Keppra 1 g IV.  His CBC showed no leukocytosis and no anemia normal platelets.  His creatinine was mildly elevated at 1.4.  His chest x-ray was unremarkable with no  signs of infiltrate.  His lactic acid was elevated at greater than 6.  Admission was requested for further evaluation of decompensation, seizure activity with prolonged postictal phase and increasing respiratory symptoms.  Review of Systems: As per HPI otherwise 10 point review of systems negative.   Past Medical History:  Diagnosis Date  . GERD (gastroesophageal reflux disease)   . Prostate nodule    benign  . Seizure ([place])   . 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 FRACTURE SURGERY     LT arm, LT leg, Rt leg  . PROSTATE BIOPSY    . URETHRAL STRICTURE DILATATION       reports that he has quit smoking. He has never used smokeless tobacco. He reports previous alcohol use. He reports previous drug use.  No Known Allergies  Family History  Problem Relation Age of Onset  . Hypertension Mother   . Diabetes Mother   . Other Father        died at age 28 in accident - log fell on him  . Kidney cancer Maternal Aunt   . Prostate cancer Paternal Uncle     Prior to Admission medications   Medication Sig Start Date End Date Taking? Authorizing Provider  lamoTRIgine (LAMICTAL) 100 MG tablet Take one tablet every morning. 05/18/19  Yes Marcial Pacas, MD  LamoTRIgine 300  MG TB24 24 hour tablet Take 1 tablet (300 mg total) by mouth at bedtime. 04/08/19  Yes Marcial Pacas, MD  omeprazole (PRILOSEC) 40 MG capsule Take 40 mg by mouth daily. 07/14/19  Yes [provider]  sertraline (ZOLOFT) 100 MG tablet Take 1.5 tablets (150 mg total) by mouth daily. 06/24/19  Yes [name], MD   Physical Exam: Vitals:   08/05/19 1341 08/05/19 1353 08/05/19 1400 08/05/19 1446  BP:   (!) 164/78 119/75  Pulse: (!) 103  (!) 107 (!) 107  Resp: 18  (!) 26 (!) 23  Temp:  99.5 F (37.5 C)    TempSrc:  Oral    SpO2: 100%  95% 91%  Weight:      Height:       Constitutional: NAD, calm, comfortable Eyes: PERRL, lids and conjunctivae normal ENMT: Mucous  membranes are moist. Posterior pharynx clear of any exudate or lesions.Normal dentition. No tongue lesions seen.  Neck: normal, supple, no masses, no thyromegaly Respiratory:  no wheezing, no crackles. Bibasilar rales heard. Normal respiratory effort. No accessory muscle use.  Cardiovascular: Regular rate and rhythm, no murmurs / rubs / gallops. No extremity edema. 2+ pedal pulses. No carotid bruits.  Abdomen: no tenderness, no masses palpated. No hepatosplenomegaly. Bowel sounds positive.  Musculoskeletal: no clubbing / cyanosis. No joint deformity upper and lower extremities. Good ROM, no contractures. Normal muscle tone.  Skin: no rashes, lesions, ulcers. No induration Neurologic: CN 2-12 grossly intact. Sensation intact, DTR normal. Strength 5/5 in all 4.  Psychiatric: Normal judgment and insight. Alert and oriented x 3. Normal mood.   Labs on Admission: I have personally reviewed following labs and imaging studies  CBC: Recent Labs  Lab 08/05/19 1344 08/05/19 1347  WBC  --  8.9  NEUTROABS  --  4.1  HGB 16.7 15.6  HCT 49.0 48.1  MCV  --  90.6  PLT  --  [id number]   Basic Metabolic Panel: Recent Labs  Lab 08/05/19 1344 08/05/19 1347  NA 142 137  K 3.7 3.4*  CL 102 101  CO2  --  22  GLUCOSE 113* 114*  BUN 11 12  CREATININE 1.40* 1.47*  CALCIUM  --  8.7*   GFR: Estimated Creatinine Clearance: 81.4 mL/min (A) (by C-G formula based on SCr of 1.47 mg/dL (H)). Liver Function Tests: Recent Labs  Lab 08/05/19 1347  AST 36  ALT 46*  ALKPHOS 71  BILITOT 0.8  PROT 7.4  ALBUMIN 4.6   No results for input(s): LIPASE, AMYLASE in the last 168 hours. No results for input(s): AMMONIA in the last 168 hours. Coagulation Profile: No results for input(s): INR, PROTIME in the last 168 hours. Cardiac Enzymes: Recent Labs  Lab 08/05/19 1347  CKTOTAL 219   BNP (last 3 results) No results for input(s): PROBNP in the last 8760 hours. HbA1C: No results for input(s): HGBA1C in the last  72 hours. CBG: No results for input(s): GLUCAP in the last 168 hours. Lipid Profile: Recent Labs    08/05/19 1401  TRIG 187*   Thyroid Function Tests: No results for input(s): TSH, T4TOTAL, FREET4, T3FREE, THYROIDAB in the last 72 hours. Anemia Panel: Recent Labs    08/05/19 1401  FERRITIN 358*   Urine analysis:    Component Value Date/Time   COLORURINE YELLOW 07/28/2018 [place] 07/28/2018 0927   LABSPEC 1.013 07/28/2018 0927   PHURINE 8.0 07/28/2018 0927   GLUCOSEU NEGATIVE 07/28/2018 0927   HGBUR NEGATIVE 07/28/2018  [place] 07/28/2018 [place] 07/28/2018 0927   PROTEINUR NEGATIVE 07/28/2018 0927   NITRITE NEGATIVE 07/28/2018 0927   LEUKOCYTESUR NEGATIVE 07/28/2018 0927    Radiological Exams on Admission: DG Chest Port 1 View  Result Date: 08/05/2019 CLINICAL DATA:  Cough and fever. Shortness of breath. COVID-19. EXAM: PORTABLE CHEST 1 VIEW COMPARISON:  07/03/2018 FINDINGS: The heart size and mediastinal contours are within normal limits. Both lungs are clear. Pulmonary vascularity is at the upper limits of normal. The visualized skeletal structures are unremarkable. IMPRESSION: No active disease. Electronically Signed   By: Lorriane Shire M.D.   On: 08/05/2019 14:22   Assessment/Plan Principal Problem:   Seizure ([place]) Active Problems:   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[place])   Epilepsy ([place])   COVID-19 virus detected   Acute respiratory distress   AKI (acute kidney injury) ([place])   Dehydration   1. Non-intractable epilepsy without status epilepticus-his seizure has responded to the IV Ativan given.  I spoke with Dr. Leonel Ramsay with neurology and he said that because the patient has taken his Lamictal in the last week that we could restart his Lamictal at his home doses.  The patient has been on Keppra in the past but wife reports that it did not control his seizures.  The patient seems to be  returning to baseline mental status.  Seizure precautions strongly recommended.  IV lorazepam ordered as needed for breakthrough seizures.  Resume Lamictal 100 mg every morning and 300 mg every evening. 2. AKI-suspect this is secondary to dehydration with treating with IV fluid hydration will follow creatinine. 3. Elevated lactic acid-secondary to recent seizure activity.  Treating with IV fluid hydration and follow levels. 4. COVID-19 infection-he is having more respiratory complaints however his chest x-ray does not show any acute infiltrate and he does not require supplemental oxygen at this time.  I am placing him on Decadron and we are admitting him to [facility] is telling me that he cannot be admitted to [facility].  The patient will have a repeat chest x-ray done in a.m. after hydration.  Follow inflammatory markers closely.  Low threshold to start remdesivir.  DVT prophylaxis: enoxaparin   Code Status: Full   Family Communication: wife telephone   Disposition Plan: observation telemetry  Consults called:  Neurology (telephone) Admission status: obs   Nandi Tonnesen MD [facility] How to contact the [facility] Attending or Consulting provider [place] or covering provider during after hours [place], for this patient?  1. Check the care team in [facility] and look for a) attending/consulting [facility] provider listed and b) the [facility] team listed 2. Log into [email or web address] and use [place]'s universal password to access. If you do not have the password, please contact the hospital operator. 3. Locate the [facility] provider you are looking for under [facility] and page to a number that you can be directly reached. 4. If you still have difficulty reaching the provider, please page the [facility] (Director on Call) for the Hospitalists listed on amion for assistance.   If 7PM-7AM, please contact night-coverage [email or web address] Password [facility]  08/05/2019, 3:53 PM

## 2019-08-05 NOTE — ED Notes (Signed)
Date and time results received: 08/05/19 1648 (use smartphrase ".now" to insert current time)  Test: lactic Critical Value: 2.0  Name of Provider Notified: Emokpae paged at [phone]  Orders Received? Or Actions Taken?: no/na

## 2019-08-05 NOTE — ED Notes (Signed)
Pt now reporting coughing and fever x 3 days.

## 2019-08-05 NOTE — ED Notes (Signed)
Pt not postictal after seizure activity. Became immediately alert/oriented. Not incontinent of urine during seizure activity and maintained o2 sat of 100%.

## 2019-08-05 NOTE — ED Notes (Signed)
Date and time results received: 08/05/19 2:33 PM  (use smartphrase ".now" to insert current time)  Test: Lactic Critical Value: 6.4  Name of Provider Notified: Sabra Heck   Orders Received? Or Actions Taken?: Orders Received - See Orders for details

## 2019-08-06 ENCOUNTER — Observation Stay (HOSPITAL_COMMUNITY): Payer: Medicaid Other

## 2019-08-06 DIAGNOSIS — R569 Unspecified convulsions: Secondary | ICD-10-CM

## 2019-08-06 LAB — COMPREHENSIVE METABOLIC PANEL
ALT: 44 U/L (ref 0–44)
AST: 30 U/L (ref 15–41)
Albumin: 4.2 g/dL (ref 3.5–5.0)
Alkaline Phosphatase: 67 U/L (ref 38–126)
Anion gap: 9 (ref 5–15)
BUN: 12 mg/dL (ref 6–20)
CO2: 24 mmol/L (ref 22–32)
Calcium: 8.7 mg/dL — ABNORMAL LOW (ref 8.9–10.3)
Chloride: 105 mmol/L (ref 98–111)
Creatinine, Ser: 1.01 mg/dL (ref 0.61–1.24)
GFR calc Af Amer: >60 mL/min (ref >60)
GFR calc non Af Amer: >60 mL/min (ref >60)
Glucose, Bld: 127 mg/dL — ABNORMAL HIGH (ref 70–99)
Potassium: 5.1 mmol/L (ref 3.5–5.1)
Sodium: 138 mmol/L (ref 135–145)
Total Bilirubin: 0.7 mg/dL (ref 0.3–1.2)
Total Protein: 7.1 g/dL (ref 6.5–8.1)

## 2019-08-06 LAB — MAGNESIUM: Magnesium: 2.1 mg/dL (ref 1.7–2.4)

## 2019-08-06 LAB — CBC WITH DIFFERENTIAL/PLATELET
Abs Immature Granulocytes: 0.02 10*3/uL (ref 0.00–0.07)
Basophils Absolute: 0 10*3/uL (ref 0.0–0.1)
Basophils Relative: 0 %
Eosinophils Absolute: 0 10*3/uL (ref 0.0–0.5)
Eosinophils Relative: 0 %
HCT: 47.9 % (ref 39.0–52.0)
Hemoglobin: 15 g/dL (ref 13.0–17.0)
Immature Granulocytes: 0 %
Lymphocytes Relative: 11 %
Lymphs Abs: 0.6 10*3/uL — ABNORMAL LOW (ref 0.7–4.0)
MCH: 29 pg (ref 26.0–34.0)
MCHC: 31.3 g/dL (ref 30.0–36.0)
MCV: 92.5 fL (ref 80.0–100.0)
Monocytes Absolute: 0.3 10*3/uL (ref 0.1–1.0)
Monocytes Relative: 5 %
Neutro Abs: 5 10*3/uL (ref 1.7–7.7)
Neutrophils Relative %: 84 %
Platelets: 170 10*3/uL (ref 150–400)
RBC: 5.18 MIL/uL (ref 4.22–5.81)
RDW: 12.4 % (ref 11.5–15.5)
WBC: 5.9 10*3/uL (ref 4.0–10.5)
nRBC: 0 % (ref 0.0–0.2)

## 2019-08-06 LAB — HIV ANTIBODY (ROUTINE TESTING W REFLEX): HIV Screen 4th Generation wRfx: NONREACTIVE

## 2019-08-06 LAB — C-REACTIVE PROTEIN: CRP: 2.2 mg/dL — ABNORMAL HIGH (ref <1.0)

## 2019-08-06 LAB — D-DIMER, QUANTITATIVE: D-Dimer, Quant: <0.27 ug/mL-FEU (ref 0.00–0.50)

## 2019-08-06 LAB — PHOSPHORUS: Phosphorus: 2 mg/dL — ABNORMAL LOW (ref 2.5–4.6)

## 2019-08-06 LAB — FERRITIN: Ferritin: 328 ng/mL (ref 24–336)

## 2019-08-06 LAB — ABO/RH: ABO/RH(D): A POS

## 2019-08-06 MED ORDER — LAMOTRIGINE ER 300 MG PO TB24: 300.0000 mg | ORAL_TABLET | Freq: Every day | ORAL | 11 refills | Status: DC

## 2019-08-06 MED ORDER — GUAIFENESIN-DM 100-10 MG/5ML PO SYRP: 10.0000 mL | ORAL_SOLUTION | ORAL | 0 refills | Status: DC | PRN

## 2019-08-06 MED ORDER — DEXAMETHASONE 6 MG PO TABS: 6.0000 mg | ORAL_TABLET | ORAL | 0 refills | Status: AC

## 2019-08-06 MED ORDER — LAMOTRIGINE 100 MG PO TABS: ORAL_TABLET | ORAL | 11 refills | Status: DC

## 2019-08-06 MED ORDER — ZINC SULFATE 220 (50 ZN) MG PO CAPS: 220.0000 mg | ORAL_CAPSULE | Freq: Every day | ORAL | 0 refills | Status: AC

## 2019-08-06 MED ORDER — ALBUTEROL SULFATE HFA 108 (90 BASE) MCG/ACT IN AERS: 2.0000 | INHALATION_SPRAY | Freq: Four times a day (QID) | RESPIRATORY_TRACT | 0 refills | Status: AC | PRN

## 2019-08-06 MED ORDER — ASCORBIC ACID 500 MG PO TABS: 500.0000 mg | ORAL_TABLET | Freq: Every day | ORAL | 0 refills | Status: AC

## 2019-08-06 NOTE — Discharge Summary (Signed)
Physician Discharge Summary  Dale Bender [id number] DOB: 1971/03/08 DOA: 08/05/2019  PCP: [facility]  Admit date: 08/05/2019  Discharge date: 08/06/2019  Admitted From:Home  Disposition:  Home  Recommendations for Outpatient Follow-up:  1. Follow up with PCP in 1-2 weeks 2. Continue on Lamictal and lamotrigine as prescribed and remain compliant with medications.  Refills given. 3. Continue dexamethasone as prescribed and complete course of treatment for Covid pneumonia.  No need for remdesivir. 4. Continue on albuterol as needed for shortness of breath or wheezing as well as other symptomatic medications.  Home Health: None  Equipment/Devices: None  Discharge Condition: Stable  CODE STATUS: Full  Diet recommendation: Heart Healthy  Brief/Interim Summary: Per HPI: Dale Bender is a 48 y.o. male with medical history significant of epilepsy where his wife reports that he had his last seizure approximately 1 month ago.  The wife reports he has not been taking his Lamictal as prescribed.  He normally takes it every evening.  He is supposed to take 1 dose in the morning (100 mg) and 1 dose in the evening (300 mg).  She reports that he misses a lot of the morning doses.  His last dose was taken last night before bed.  The patient had been having complaints of cough and fever for the past 3 days.  He was Covid tested at an outpatient clinic and received the positive test result yesterday.  His wife says she sent him to the emergency department because she was worried about his increasing shortness of breath.  Unfortunately when EMS arrived they found the patient to be unresponsive and hypotensive and he was having tonic-clonic seizure-like activity.  He was given 2 doses of epinephrine and Solu-Medrol IV.  He started to respond to these treatments.  His initial vitals were heart rate 90 sinus rhythm on EKG, BP 134/80, pulse ox 100% on nonrebreather.  12/25: Patient  was admitted with seizures secondary to medication noncompliance on Lamictal.  He received IV Ativan and had returned to his usual baseline level of functioning and has resumed his home medications.  He was also noted to have some AKI secondary to dehydration which has now resolved with creatinine at 1.01.  He is also noted to be COVID-19 positive, but does not require any oxygen, nor does he have any symptoms.  He was started on some Decadron which will be continued for 1 more week outpatient.  He had no need for remdesivir.  No other acute overnight events or further seizure activity noted and he is stable for discharge to home at this point in time.  Medication refills of Lamictal short and long-acting refilled.  Discharge Diagnoses:  Principal Problem:   Seizure ([place]) Active Problems:   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[place])   Epilepsy ([place])   COVID-19 virus detected   Acute respiratory distress   AKI (acute kidney injury) ([place])   Dehydration  Principal discharge diagnosis: Seizures secondary to medication noncompliance.  Covid positive.  Discharge Instructions  Discharge Instructions    Diet - low sodium heart healthy   Complete by: As directed    Increase activity slowly   Complete by: As directed      Allergies as of 08/06/2019   No Known Allergies     Medication List    TAKE these medications   albuterol 108 (90 Base) MCG/ACT inhaler Commonly known as: VENTOLIN HFA Inhale 2 puffs into the lungs every 6 (six) hours as needed for wheezing  or shortness of breath.   ascorbic acid 500 MG tablet Commonly known as: VITAMIN C Take 1 tablet (500 mg total) by mouth daily for 10 days. Start taking on: August 07, 2019   dexamethasone 6 MG tablet Commonly known as: DECADRON Take 1 tablet (6 mg total) by mouth daily for 8 days.   guaiFENesin-dextromethorphan 100-10 MG/5ML syrup Commonly known as: ROBITUSSIN DM Take 10 mLs by mouth every 4 (four) hours as  needed for cough.   LamoTRIgine 300 MG Tb24 24 hour tablet Take 1 tablet (300 mg total) by mouth at bedtime.   lamoTRIgine 100 MG tablet Commonly known as: LAMICTAL Take one tablet every morning.   omeprazole 40 MG capsule Commonly known as: PRILOSEC Take 40 mg by mouth daily.   sertraline 100 MG tablet Commonly known as: ZOLOFT Take 1.5 tablets (150 mg total) by mouth daily.   zinc sulfate 220 (50 Zn) MG capsule Take 1 capsule (220 mg total) by mouth daily for 10 days. Start taking on: August 07, 2019      Follow-up Information    [facility] Follow up in 1 week(s).   Contact information: [place] [address] [phone]        Denny Levy, [place] .   Specialty: Family Medicine Contact information: [place] [address] [phone]          No Known Allergies  Consultations:  Neurology consulted on phone.   Procedures/Studies: Portable chest 1 View  Result Date: 08/06/2019 CLINICAL DATA:  Patient found unresponsive and having seizures. COVID-19 positive while at ED. EXAM: PORTABLE CHEST 1 VIEW COMPARISON:  08/05/2019 FINDINGS: Lungs are adequately inflated without focal airspace consolidation or effusion. Cardiomediastinal silhouette and remainder of the exam is unchanged. IMPRESSION: No acute findings. Electronically Signed   By: Marin Olp M.D.   On: 08/06/2019 04:20   DG Chest Port 1 View  Result Date: 08/05/2019 CLINICAL DATA:  Cough and fever. Shortness of breath. COVID-19. EXAM: PORTABLE CHEST 1 VIEW COMPARISON:  07/03/2018 FINDINGS: The heart size and mediastinal contours are within normal limits. Both lungs are clear. Pulmonary vascularity is at the upper limits of normal. The visualized skeletal structures are unremarkable. IMPRESSION: No active disease. Electronically Signed   By: Lorriane Shire M.D.   On: 08/05/2019 14:22   ECHOCARDIOGRAM COMPLETE  Result Date: 07/28/2019   ECHOCARDIOGRAM REPORT    Patient Name:   Dale Bender Date of Exam: 07/28/2019 Medical Rec #:  [id number]        Height:       74.0 in Accession #:    [id number]       Weight:       250.0 lb Date of Birth:  08-08-1971        BSA:          2.39 m Patient Age:    48 years         BP:           126/79 mmHg Patient Gender: M                HR:           80 bpm. Exam Location:  [facility] Procedure: 2D Echo, Cardiac Doppler, Color Doppler and Strain Analysis Indications:    R06.02 SOB  History:        Patient has no prior history of Echocardiogram examinations.  Seizure disorder, Signs/Symptoms:Shortness of Breath and fatigue                 and bilateral leg edema; Risk Factors:Former Smoker. Nuclear                 medicine scan 05/25/2019 showed LV EF 55-65%, no ischemia.  Sonographer:    Hester Mates BS, RVT, RDCS Referring Phys: Kate Sable, A IMPRESSIONS  1. Left ventricular ejection fraction, by visual estimation, is 60 to 65%. The left ventricle has normal function. There is mildly increased left ventricular hypertrophy.  2. Left ventricular diastolic parameters are consistent with Grade I diastolic dysfunction (impaired relaxation).  3. The left ventricle has no regional wall motion abnormalities.  4. Global right ventricle has normal systolic function.The right ventricular size is normal. No increase in right ventricular wall thickness.  5. Left atrial size was normal.  6. Right atrial size was normal.  7. The mitral valve is normal in structure. Trivial mitral valve regurgitation. No evidence of mitral stenosis.  8. The tricuspid valve is normal in structure. Tricuspid valve regurgitation is not demonstrated.  9. The aortic valve is tricuspid. Aortic valve regurgitation is not visualized. No evidence of aortic valve sclerosis or stenosis. 10. The pulmonic valve was not well visualized. Pulmonic valve regurgitation is not visualized. 11. The aortic root was not well visualized. 12. The interatrial septum was not  well visualized. FINDINGS  Left Ventricle: Left ventricular ejection fraction, by visual estimation, is 60 to 65%. The left ventricle has normal function. The left ventricle has no regional wall motion abnormalities. There is mildly increased left ventricular hypertrophy. Left ventricular diastolic parameters are consistent with Grade I diastolic dysfunction (impaired relaxation). Normal left atrial pressure. Right Ventricle: The right ventricular size is normal. No increase in right ventricular wall thickness. Global RV systolic function is has normal systolic function. Left Atrium: Left atrial size was normal in size. Right Atrium: Right atrial size was normal in size Pericardium: There is no evidence of pericardial effusion. Mitral Valve: The mitral valve is normal in structure. Trivial mitral valve regurgitation. No evidence of mitral valve stenosis by observation. Tricuspid Valve: The tricuspid valve is normal in structure. Tricuspid valve regurgitation is not demonstrated. Aortic Valve: The aortic valve is tricuspid. Aortic valve regurgitation is not visualized. The aortic valve is structurally normal, with no evidence of sclerosis or stenosis. Aortic valve mean gradient measures 2.8 mmHg. Aortic valve peak gradient measures 5.4 mmHg. Aortic valve area, by VTI measures 3.12 cm. Pulmonic Valve: The pulmonic valve was not well visualized. Pulmonic valve regurgitation is not visualized. Pulmonic regurgitation is not visualized. No evidence of pulmonic stenosis. Aorta: The aortic root was not well visualized and the aortic root is normal in size and structure. Pulmonary Artery: Indeterminant PASP, inadequate TR jet.  IAS/Shunts: The interatrial septum was not well visualized.  LEFT VENTRICLE PLAX 2D LVIDd:         4.43 cm       Diastology LVIDs:         2.45 cm       LV e' lateral:   9.77 cm/s LV PW:         1.14 cm       LV E/e' lateral: 6.9 LV IVS:        1.06 cm       LV e' medial:    7.35 cm/s LVOT diam:      2.20 cm  LV E/e' medial:  9.2 LV SV:         68 ml LV SV Index:   27.56 LVOT Area:     3.80 cm  LV Volumes (MOD) LV area d, A2C:    28.40 cm LV area d, A4C:    28.90 cm LV area s, A2C:    14.20 cm LV area s, A4C:    17.30 cm LV major d, A2C:   8.37 cm LV major d, A4C:   8.06 cm LV major s, A2C:   7.18 cm LV major s, A4C:   7.04 cm LV vol d, MOD A2C: 82.3 ml LV vol d, MOD A4C: 87.0 ml LV vol s, MOD A2C: 23.7 ml LV vol s, MOD A4C: 36.1 ml LV SV MOD A2C:     58.6 ml LV SV MOD A4C:     87.0 ml LV SV MOD BP:      56.6 ml RIGHT VENTRICLE RV S prime:     13.50 cm/s TAPSE (M-mode): 1.9 cm LEFT ATRIUM         Index LA diam:    3.60 cm 1.51 cm/m  AORTIC VALVE                   PULMONIC VALVE AV Area (Vmax):    3.28 cm    PV Vmax:       0.86 m/s AV Area (Vmean):   3.27 cm    PV Vmean:      58.000 cm/s AV Area (VTI):     3.12 cm    PV VTI:        0.163 m AV Vmax:           116.06 cm/s PV Peak grad:  3.0 mmHg AV Vmean:          77.118 cm/s PV Mean grad:  2.0 mmHg AV VTI:            0.207 m AV Peak Grad:      5.4 mmHg AV Mean Grad:      2.8 mmHg LVOT Vmax:         100.00 cm/s LVOT Vmean:        66.300 cm/s LVOT VTI:          0.170 m LVOT/AV VTI ratio: 0.82  AORTA Ao Root diam: 2.90 cm MITRAL VALVE MV Area (PHT): 2.45 cm             SHUNTS MV PHT:        89.90 msec           Systemic VTI:  0.17 m MV Decel Time: 310 msec             Systemic Diam: 2.20 cm MV E velocity: 67.40 cm/s 103 cm/s MV A velocity: 78.10 cm/s 70.3 cm/s MV E/A ratio:  0.86       1.5  Carlyle Dolly MD Electronically signed by Carlyle Dolly MD Signature Date/Time: 07/28/2019/4:23:47 PM    Final      Discharge Exam: Vitals:   08/06/19 0945 08/06/19 1000  BP:  (!) 121/93  Pulse: 84 82  Resp:  (!) 26  Temp:    SpO2: 99% 100%   Vitals:   08/06/19 0808 08/06/19 0943 08/06/19 0945 08/06/19 1000  BP:  (!) 123/101  (!) 121/93  Pulse: 78 90 84 82  Resp: 16   (!) 26  Temp:      TempSrc:      SpO2: 97%  99% 99% 100%  Weight:       Height:        General: Pt is alert, awake, not in acute distress Cardiovascular: RRR, S1/S2 +, no rubs, no gallops Respiratory: CTA bilaterally, no wheezing, no rhonchi Abdominal: Soft, NT, ND, bowel sounds + Extremities: no edema, no cyanosis    The results of significant diagnostics from this hospitalization (including imaging, microbiology, ancillary and laboratory) are listed below for reference.     Microbiology: Recent Results (from the past 240 hour(s))  Blood Culture (routine x 2)     Status: None (Preliminary result)   Collection Time: 08/05/19  2:01 PM   Specimen: Left Antecubital; Blood  Result Value Ref Range Status   Specimen Description LEFT ANTECUBITAL  Final   Special Requests   Final    BOTTLES DRAWN AEROBIC AND ANAEROBIC Blood Culture adequate volume   Culture   Final    NO GROWTH < 24 HOURS Performed at [facility], [address], [place], [place] [address]    Report Status PENDING  Incomplete  Blood Culture (routine x 2)     Status: None (Preliminary result)   Collection Time: 08/05/19  3:56 PM   Specimen: Right Antecubital; Blood  Result Value Ref Range Status   Specimen Description RIGHT ANTECUBITAL  Final   Special Requests   Final    BOTTLES DRAWN AEROBIC ONLY Blood Culture adequate volume   Culture   Final    NO GROWTH < 24 HOURS Performed at [facility], [address] [place], [place] [address]    Report Status PENDING  Incomplete     Labs: BNP (last 3 results) No results for input(s): BNP in the last 8760 hours. Basic Metabolic Panel: Recent Labs  Lab 08/05/19 1344 08/05/19 1347 08/06/19 0629  NA 142 137 138  K 3.7 3.4* 5.1  CL 102 101 105  CO2  --  22 24  GLUCOSE 113* 114* 127*  BUN 11 12 12   CREATININE 1.40* 1.47* 1.01  CALCIUM  --  8.7* 8.7*  MG  --  2.0 2.1  PHOS  --   --  2.0*   Liver Function Tests: Recent Labs  Lab 08/05/19 1347 08/06/19 0629  AST 36 30  ALT 46* 44  ALKPHOS 71 67  BILITOT 0.8 0.7  PROT 7.4  7.1  ALBUMIN 4.6 4.2   No results for input(s): LIPASE, AMYLASE in the last 168 hours. No results for input(s): AMMONIA in the last 168 hours. CBC: Recent Labs  Lab 08/05/19 1344 08/05/19 1347 08/06/19 0629  WBC  --  8.9 5.9  NEUTROABS  --  4.1 5.0  HGB 16.7 15.6 15.0  HCT 49.0 48.1 47.9  MCV  --  90.6 92.5  PLT  --  185 170   Cardiac Enzymes: Recent Labs  Lab 08/05/19 1347  CKTOTAL 219   BNP: Invalid input(s): POCBNP CBG: No results for input(s): GLUCAP in the last 168 hours. D-Dimer Recent Labs    08/05/19 1401 08/06/19 0629  DDIMER 0.37 <0.27   Hgb A1c No results for input(s): HGBA1C in the last 72 hours. Lipid Profile Recent Labs    08/05/19 1401  TRIG 187*   Thyroid function studies No results for input(s): TSH, T4TOTAL, T3FREE, THYROIDAB in the last 72 hours.  Invalid input(s): FREET3 Anemia work up Recent Labs    08/05/19 1401 08/06/19 0629  FERRITIN 358* 328   Urinalysis    Component Value Date/Time   COLORURINE YELLOW 07/28/2018  [place] 07/28/2018 0927   LABSPEC 1.013 07/28/2018 0927   PHURINE 8.0 07/28/2018 0927   GLUCOSEU NEGATIVE 07/28/2018 0927   HGBUR NEGATIVE 07/28/2018 0927   BILIRUBINUR NEGATIVE 07/28/2018 [place] 07/28/2018 0927   PROTEINUR NEGATIVE 07/28/2018 0927   NITRITE NEGATIVE 07/28/2018 0927   LEUKOCYTESUR NEGATIVE 07/28/2018 [id number]   Sepsis Labs Invalid input(s): PROCALCITONIN,  WBC,  LACTICIDVEN Microbiology Recent Results (from the past 240 hour(s))  Blood Culture (routine x 2)     Status: None (Preliminary result)   Collection Time: 08/05/19  2:01 PM   Specimen: Left Antecubital; Blood  Result Value Ref Range Status   Specimen Description LEFT ANTECUBITAL  Final   Special Requests   Final    BOTTLES DRAWN AEROBIC AND ANAEROBIC Blood Culture adequate volume   Culture   Final    NO GROWTH < 24 HOURS Performed at [facility], [address] [place], [place] [address]     Report Status PENDING  Incomplete  Blood Culture (routine x 2)     Status: None (Preliminary result)   Collection Time: 08/05/19  3:56 PM   Specimen: Right Antecubital; Blood  Result Value Ref Range Status   Specimen Description RIGHT ANTECUBITAL  Final   Special Requests   Final    BOTTLES DRAWN AEROBIC ONLY Blood Culture adequate volume   Culture   Final    NO GROWTH < 24 HOURS Performed at [facility], [address] [place], [place] [address]    Report Status PENDING  Incomplete     Time coordinating discharge: 35 minutes  SIGNED:   Rodena Goldmann, DO [facility] 08/06/2019, 10:37 AM  If 7PM-7AM, please contact night-coverage [email or web address]

## 2019-08-06 NOTE — ED Notes (Signed)
Informed pt's spouse that she needs to bring pt's lamotrigine either to [place] or [place] depending when pt will be transferred. Wife states she is not coming out tonight she would deliver it tomorrow.

## 2019-08-09 ENCOUNTER — Telehealth: Payer: Self-pay | Admitting: Neurology

## 2019-08-09 NOTE — Procedures (Signed)
S PATIENT'S NAME:  Dale Bender, [place] DOB:      03/21/1971      MR#:    [id number]     DATE OF RECORDING: 07/16/2019 REFERRING M.D.:   Study Performed:   CPAP  Titration HISTORY: 48 year old man with a history of reflux disease, urethral stricture, seizure disorder, shortness of breath, and obesity, who presents for a CPAP titration study. His baseline sleep study from 06/20/19 showed severe obstructive sleep apnea with a total AHI of 78.7/hour, REM AHI of 95.2/hour, supine AHI of 87.3/hour, and O2 nadir of 69%. He was noted to have central and mixed apneas. The patient endorsed the Epworth Sleepiness Scale at 22 points. The patient's weight 250 pounds with a height of 74 (inches), resulting in a BMI of 32. kg/m2. The patient's neck circumference measured 18 inches.  CURRENT MEDICATIONS: Lamictal, Lamotrigine, Zoloft  PROCEDURE:  This is a multichannel digital polysomnogram utilizing the SomnoStar 11.2 system.  Electrodes and sensors were applied and monitored per AASM Specifications.   EEG, EOG, Chin and Limb EMG, were sampled at 200 Hz.  ECG, Snore and Nasal Pressure, Thermal Airflow, Respiratory Effort, CPAP Flow and Pressure, Oximetry was sampled at 50 Hz. Digital video and audio were recorded.      The patient was fitted with medium F20 FFM. CPAP was initiated at 5 cmH20 with heated humidity per AASM standards and pressure was advanced to 14 cm H20 because of hypopneas, apneas and desaturations. His AHI was 36.4/hour at the pressure of 14 cm and therefore, he was started on BiPAP. He was titrated from a  pressure of 15/11 cm and had primarily central events. He was switched to BiPAP ST due to ongoing central events and titrated to a final pressure of 20/15 cm with a back up rate of 16/min. His AHI was 0/hour, with non-supine NREM sleep achieved and O2 nadir of 86%.   Lights Out was at 21:48 and Lights On at 05:00. Total recording time (TRT) was 432 minutes, with a total sleep time (TST) of 382.5  minutes. The patient's sleep latency was 17 minutes. REM latency was 75.5 minutes.  The sleep efficiency was 88.5 %.    SLEEP ARCHITECTURE: WASO (Wake after sleep onset)  was 32.5 minutes.  There were 19.5 minutes in Stage N1, 230.5 minutes Stage N2, 0 minutes Stage N3 and 132.5 minutes in Stage REM.  The percentage of Stage N1 was 5.1%, Stage N2 was 60.3%, Stage N3 was 0% and Stage R (REM sleep) was 34.6%. [] The sleep architecture was notable for ?????. The arousals were noted as: 23 were spontaneous, 0 were associated with PLMs, 12 were associated with respiratory events.  RESPIRATORY ANALYSIS:  There was a total of 93 respiratory events: 1 obstructive apneas, 15 central apneas and 0 mixed apneas with a total of 16 apneas and an apnea index (AI) of 2.5 /hour. There were 77 hypopneas with a hypopnea index of 12.1/hour. The patient also had 0 respiratory event related arousals (RERAs).      The total APNEA/HYPOPNEA INDEX  (AHI) was 14.6 /hour and the total RESPIRATORY DISTURBANCE INDEX was 14.6 /hour  13 events occurred in REM sleep and 80 events in NREM. The REM AHI was 5.9 /hour versus a non-REM AHI of 19.2 /hour.  The patient spent 252.5 minutes of total sleep time in the supine position and 130 minutes in non-supine. The supine AHI was 15.9, versus a non-supine AHI of 12.0.  OXYGEN SATURATION & C02:  The baseline 02 saturation  was 94%, with the lowest being 83%. Time spent below 89% saturation equaled 8 minutes.  PERIODIC LIMB MOVEMENTS:  The patient had a total of 0 Periodic Limb Movements. The Periodic Limb Movement (PLM) index was 0 and the PLM Arousal index was 0 /hour.  Audio and video analysis did not show any abnormal or unusual movements, behaviors, phonations or vocalizations. The patient used the bedside urinal twice due to being on crutches. The EKG was in keeping with normal sinus rhythm (NSR).  Post-study, the patient indicated that sleep was better than usual.   IMPRESSION:    1. Complex sleep apnea 2. Insufficient treatment with CPAP 3. Dysfunctions associated with sleep stages or arousal from sleep   RECOMMENDATIONS:   1. This study demonstrates significant improvement of the patient's central and obstructive sleep apnea with BiPAP ST; CPAP therapy and standard BiPAP therapy were not sufficient in treating the central component of his sleep apnea. I will recommend home BiPAP ST at a pressure of 20/15 cm via medium F20 FFM with heated humidity. The patient should be reminded to be fully compliant with PAP therapy to improve sleep related symptoms and decrease long term cardiovascular risks. The patient should be reminded, that it may take up to 3 months to get fully used to using PAP with all planned sleep. The earlier full compliance is achieved, the better long term compliance tends to be. Please note that untreated obstructive sleep apnea may carry additional perioperative morbidity. Patients with significant obstructive sleep apnea should receive perioperative PAP therapy and the surgeons and particularly the anesthesiologist should be informed of the diagnosis and the severity of the sleep disordered breathing. 2. This study shows sleep fragmentation and abnormal sleep stage percentages; these are nonspecific findings and per se do not signify an intrinsic sleep disorder or a cause for the patient's sleep-related symptoms. Causes include (but are not limited to) the first night effect of the sleep study, circadian rhythm disturbances, medication effect or an underlying mood disorder or medical problem.  3. The patient should be cautioned not to drive, work at heights, or operate dangerous or heavy equipment when tired or sleepy. Review and reiteration of good sleep hygiene measures should be pursued with any patient. 4. The patient will be seen in follow-up in the sleep clinic at [facility] for discussion of the test results, symptom and treatment compliance review, further  management strategies, etc. The referring provider will be notified of the test results.   I certify that I have reviewed the entire raw data recording prior to the issuance of this report in accordance with the Standards of Accreditation of the American Academy of Sleep Medicine (AASM)   Star Age, MD, PhD Diplomat, American Board of Neurology and Sleep Medicine (Neurology and Sleep Medicine)

## 2019-08-09 NOTE — Progress Notes (Signed)
Patient referred by Dr. Bronson Ing, seen by me on 05/26/19, diagnostic PSG on 06/20/19, Patient had a CPAP titration study on 07/16/19.  Please call and inform patient that I have entered an order for treatment with positive airway pressure (PAP) treatment for obstructive sleep apnea (OSA). He did well during the latest sleep study with BiPAP ST. We will, therefore, arrange for a machine for home use through a DME (durable medical equipment) company of His choice; and I will see the patient back in follow-up in about 10 weeks. Please also explain to the patient that I will be looking out for compliance data, which can be downloaded from the machine (stored on an SD card, that is inserted in the machine) or via remote access through a modem, that is built into the machine. At the time of the followup appointment we will discuss sleep study results and how it is going with PAP treatment at home. Please advise patient to bring His machine at the time of the first FU visit, even though this is cumbersome. Bringing the machine for every visit after that will likely not be needed, but often helps for the first visit to troubleshoot if needed. Please re-enforce the importance of compliance with treatment and the need for [name] to monitor compliance data - often an insurance requirement and actually good feedback for the patient as far as how they are doing.  Also remind patient, that any interim PAP machine or mask issues should be first addressed with the DME company, as they can often help better with technical and mask fit issues. Please ask if patient has a preference regarding DME company.  Please also make sure, the patient has a follow-up appointment with me in about 10 weeks from the setup date, thanks. May see one of our nurse practitioners if needed for proper timing of the FU appointment.  Please fax or rout report to the referring provider. Thanks,   Star Age, MD, PhD [facility])

## 2019-08-09 NOTE — Telephone Encounter (Signed)
-----   Message from Star Age, MD sent at 08/09/2019 10:24 AM EST ----- Patient referred by Dr. Bronson Ing, seen by me on 05/26/19, diagnostic PSG on 06/20/19, Patient had a CPAP titration study on 07/16/19.  Please call and inform patient that I have entered an order for treatment with positive airway pressure (PAP) treatment for obstructive sleep apnea (OSA). He did well during the latest sleep study with BiPAP ST. We will, therefore, arrange for a machine for home use through a DME (durable medical equipment) company of His choice; and I will see the patient back in follow-up in about 10 weeks. Please also explain to the patient that I will be looking out for compliance data, which can be downloaded from the machine (stored on an SD card, that is inserted in the machine) or via remote access through a modem, that is built into the machine. At the time of the followup appointment we will discuss sleep study results and how it is going with PAP treatment at home. Please advise patient to bring His machine at the time of the first FU visit, even though this is cumbersome. Bringing the machine for every visit after that will likely not be needed, but often helps for the first visit to troubleshoot if needed. Please re-enforce the importance of compliance with treatment and the need for [name] to monitor compliance data - often an insurance requirement and actually good feedback for the patient as far as how they are doing.  Also remind patient, that any interim PAP machine or mask issues should be first addressed with the DME company, as they can often help better with technical and mask fit issues. Please ask if patient has a preference regarding DME company.  Please also make sure, the patient has a follow-up appointment with me in about 10 weeks from the setup date, thanks. May see one of our nurse practitioners if needed for proper timing of the FU appointment.  Please fax or rout report to the referring  provider. Thanks,   Star Age, MD, PhD [facility])

## 2019-08-09 NOTE — Addendum Note (Signed)
Addended by: Star Age on: 08/09/2019 10:24 AM   Modules accepted: Orders

## 2019-08-09 NOTE — Telephone Encounter (Signed)
I called pt. I advised pt that Dr. Rexene Alberts reviewed their sleep study results and found that pt was best tolerated on Bipap. Dr. Rexene Alberts recommends that pt starts BiPAP ST 20/15 cm water pressure. I reviewed PAP compliance expectations with the pt. Pt is agreeable to starting a CPAP. I advised pt that an order will be sent to a DME, [facility], and [facility] will call the pt within about one week after they file with the pt's insurance. [facility] will show the pt how to use the machine, fit for masks, and troubleshoot the CPAP if needed. A follow up appt was made for insurance purposes with Ward Givens, NP on April 7,2021 at 9 am. Pt verbalized understanding to arrive 15 minutes early and bring their CPAP. A letter with all of this information in it will be mailed to the pt as a reminder. I verified with the pt that the address we have on file is correct. Pt verbalized understanding of results. Pt had no questions at this time but was encouraged to call back if questions arise. I have sent the order to [facility] and have received confirmation that they have received the order.

## 2019-08-10 ENCOUNTER — Telehealth: Payer: Self-pay

## 2019-08-10 ENCOUNTER — Encounter (INDEPENDENT_AMBULATORY_CARE_PROVIDER_SITE_OTHER): Payer: Self-pay

## 2019-08-10 LAB — CULTURE, BLOOD (ROUTINE X 2)
Culture: NO GROWTH
Culture: NO GROWTH
Special Requests: ADEQUATE
Special Requests: ADEQUATE

## 2019-08-10 NOTE — Telephone Encounter (Signed)
Patient advise on temperature per protocol:   Temperature is the same and is noted to be less than 100.4: Continue to monitor at home.   Advise patient to stay hydrated, push oral fluids if able, and advise pt. to avoid using multiple blankets and layer of clothing to prevent overheating.   Avoid over use of anti-pyretic medications for fevers less than 101 degrees Fahrenheit.   Fever helps fight infection.    Worsening temperature, treat if temperature is > 101: treat with OTC anti-fever medications (Tylenol and/or Ibuprofen)   May alternate Tylenol and Ibuprofen every 3 hours as needed for fever greater than 101. Example: Tylenol at 9AM, Ibuprofen at 12PM, Tylenol at 3PM, Ibuprofen at 6PM, Tylenol at 9PM, Ibuprofen at 12AM, Tylenol at 3AM, Ibuprofen at 6AM. Then restart rotation with Tylenol at 9AM.   If fever remains for greater than 3 days, notify PCP.   If fever becomes greater than 103 and unable to reduce with over the counter medication, contact PCP.  Patient doesn't currently have a fever. Patient states that was a couple days ago. Patient will continue to monitor

## 2019-08-11 ENCOUNTER — Encounter (INDEPENDENT_AMBULATORY_CARE_PROVIDER_SITE_OTHER): Payer: Self-pay

## 2019-08-12 ENCOUNTER — Encounter (INDEPENDENT_AMBULATORY_CARE_PROVIDER_SITE_OTHER): Payer: Self-pay

## 2019-08-13 ENCOUNTER — Encounter (INDEPENDENT_AMBULATORY_CARE_PROVIDER_SITE_OTHER): Payer: Self-pay

## 2019-08-14 ENCOUNTER — Encounter (INDEPENDENT_AMBULATORY_CARE_PROVIDER_SITE_OTHER): Payer: Self-pay

## 2019-08-15 ENCOUNTER — Encounter (INDEPENDENT_AMBULATORY_CARE_PROVIDER_SITE_OTHER): Payer: Self-pay

## 2019-08-16 ENCOUNTER — Encounter (INDEPENDENT_AMBULATORY_CARE_PROVIDER_SITE_OTHER): Payer: Self-pay

## 2019-08-17 ENCOUNTER — Encounter (INDEPENDENT_AMBULATORY_CARE_PROVIDER_SITE_OTHER): Payer: Self-pay

## 2019-08-17 ENCOUNTER — Ambulatory Visit (HOSPITAL_COMMUNITY): Payer: Medicaid Other | Admitting: Psychiatry

## 2019-08-18 ENCOUNTER — Encounter (INDEPENDENT_AMBULATORY_CARE_PROVIDER_SITE_OTHER): Payer: Self-pay

## 2019-08-19 ENCOUNTER — Encounter (INDEPENDENT_AMBULATORY_CARE_PROVIDER_SITE_OTHER): Payer: Self-pay

## 2019-08-20 ENCOUNTER — Encounter (INDEPENDENT_AMBULATORY_CARE_PROVIDER_SITE_OTHER): Payer: Self-pay

## 2019-08-21 ENCOUNTER — Encounter (INDEPENDENT_AMBULATORY_CARE_PROVIDER_SITE_OTHER): Payer: Self-pay

## 2019-08-22 ENCOUNTER — Encounter (INDEPENDENT_AMBULATORY_CARE_PROVIDER_SITE_OTHER): Payer: Self-pay

## 2019-08-23 DIAGNOSIS — U071 COVID-19: Secondary | ICD-10-CM | POA: Insufficient documentation

## 2019-08-28 IMAGING — CT CT RENAL STONE PROTOCOL
2 of 4 series · 16 of 46 positions shown, 18 images · non-contrast
Comparison: None.

CLINICAL DATA: Hematuria. Pelvic pain and groin pain which began
yesterday.

EXAM:
CT ABDOMEN AND PELVIS WITHOUT CONTRAST
TECHNIQUE: Multidetector CT imaging of the abdomen and pelvis was performed
following the standard protocol without IV contrast.

[Series 2: axial st · axial · 0.74mm/px · z∈[-501,-21]mm · 13 of 106 slices shown, 15 images]
[im 5/106  soft-tissue]
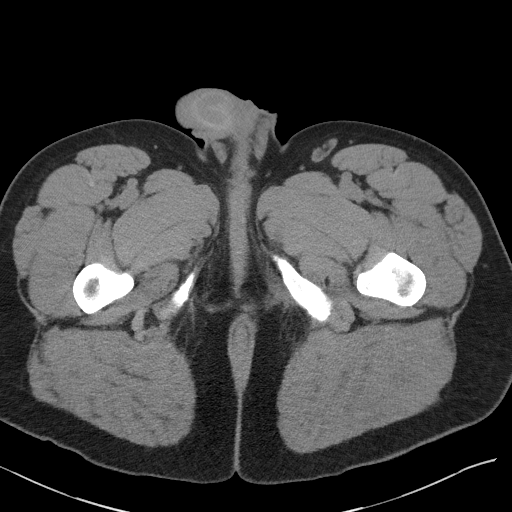
[im 5/106  bone]
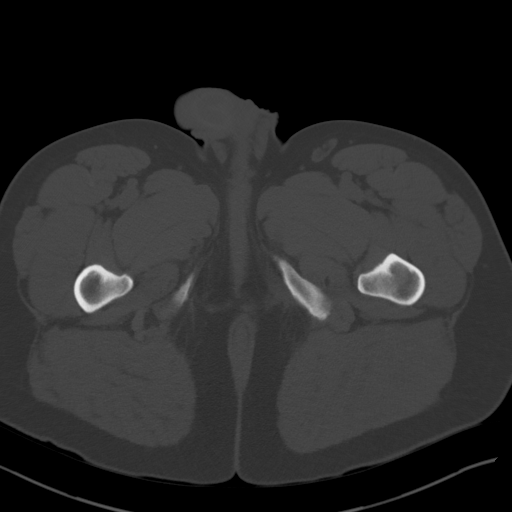
[im 13/106  soft-tissue]
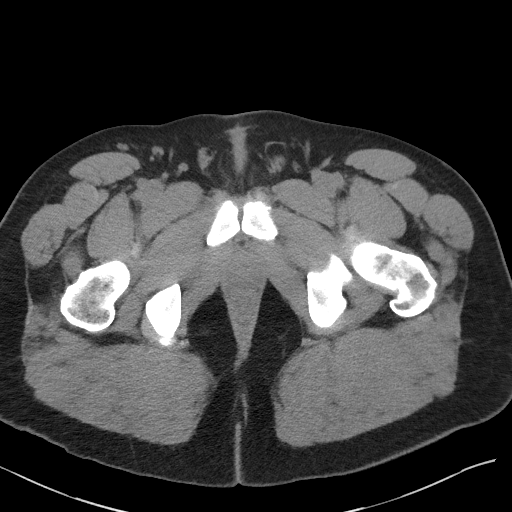
[im 22/106  soft-tissue]
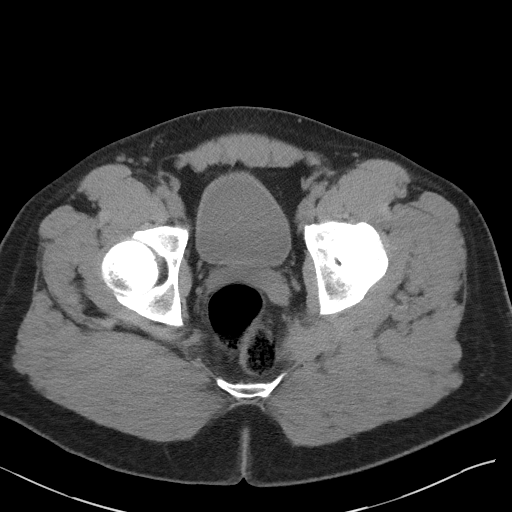
[im 30/106  soft-tissue]
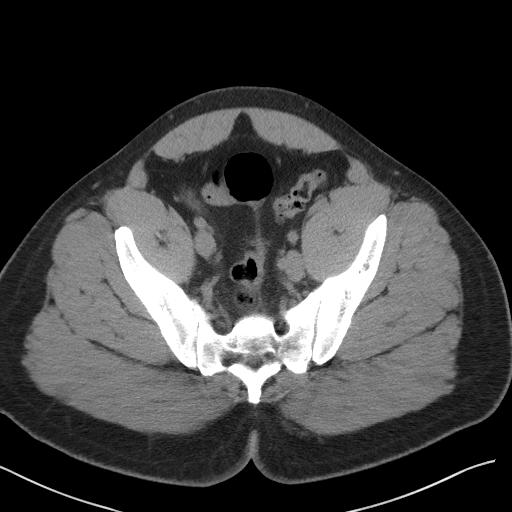
[im 38/106  soft-tissue]
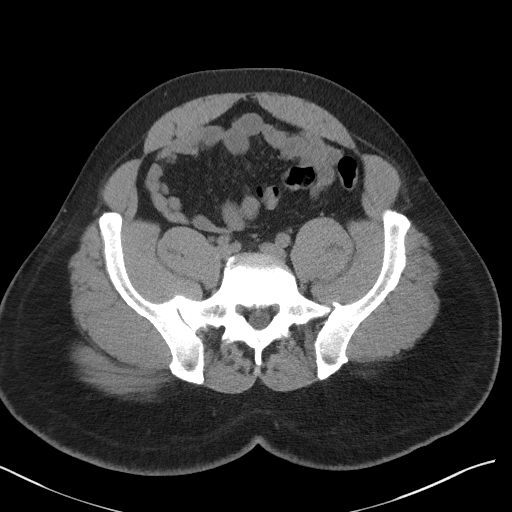
[im 47/106  soft-tissue]
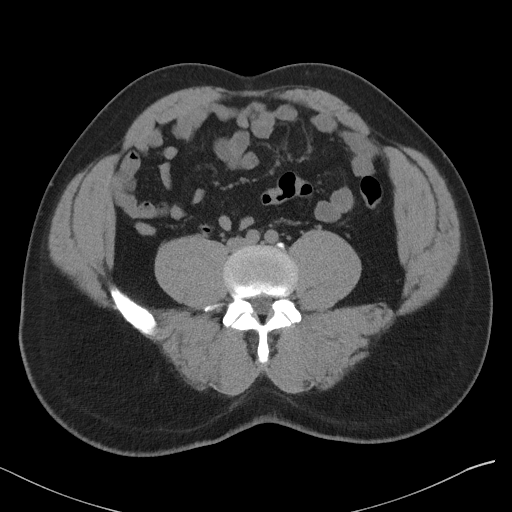
[im 55/106  soft-tissue]
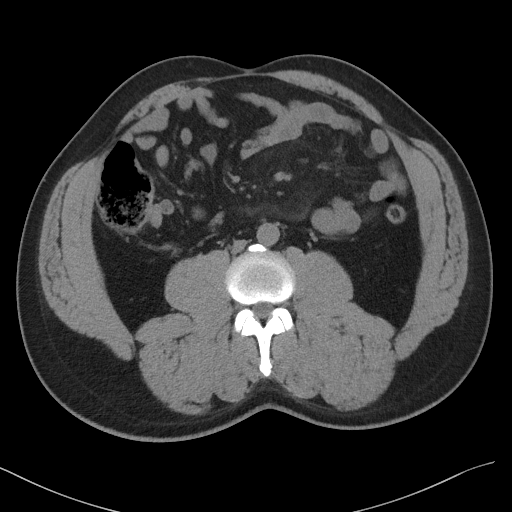
[im 59/106  soft-tissue]
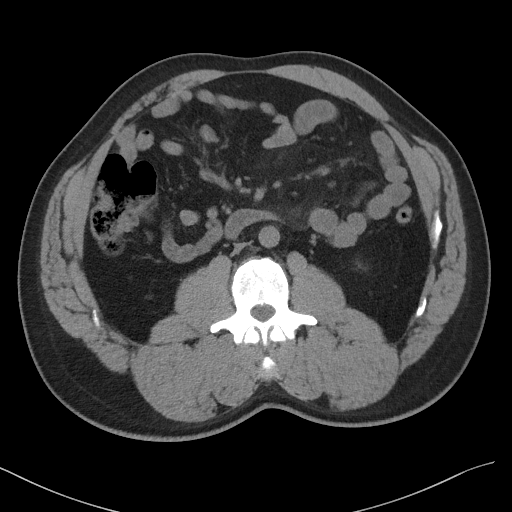
[im 68/106  soft-tissue]
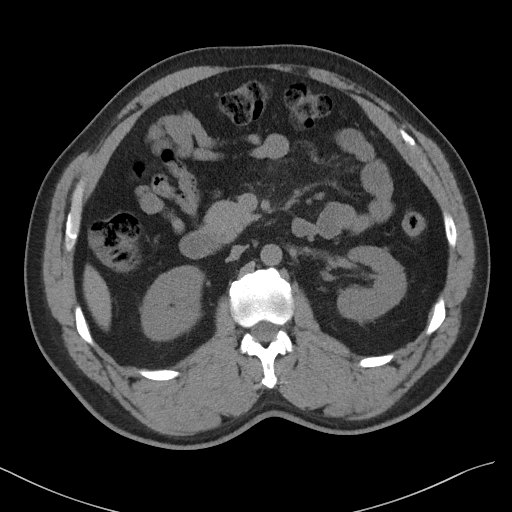
[im 68/106  bone]
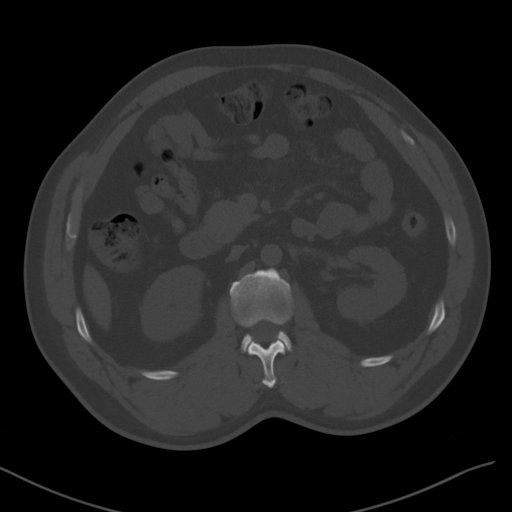
[im 76/106  soft-tissue]
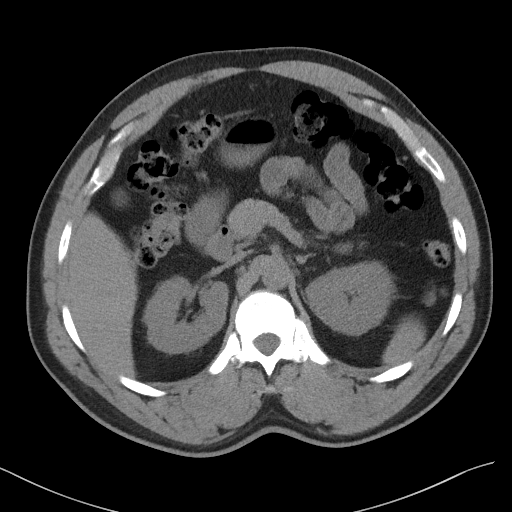
[im 85/106  soft-tissue]
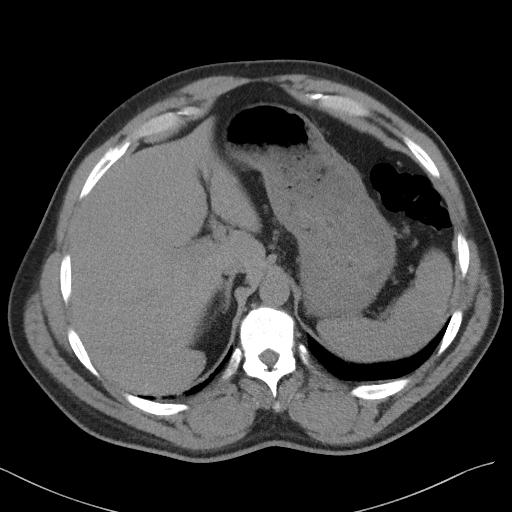
[im 93/106  soft-tissue]
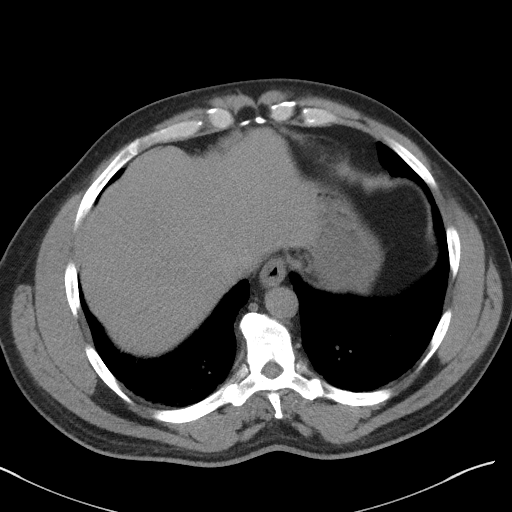
[im 101/106  soft-tissue]
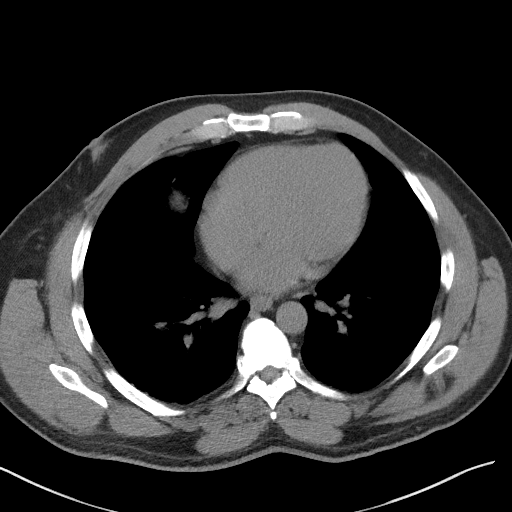

[Series 5: coronal st · coronal · 0.73mm/px · 3 of 100 slices shown]
[im 34/100  soft-tissue]
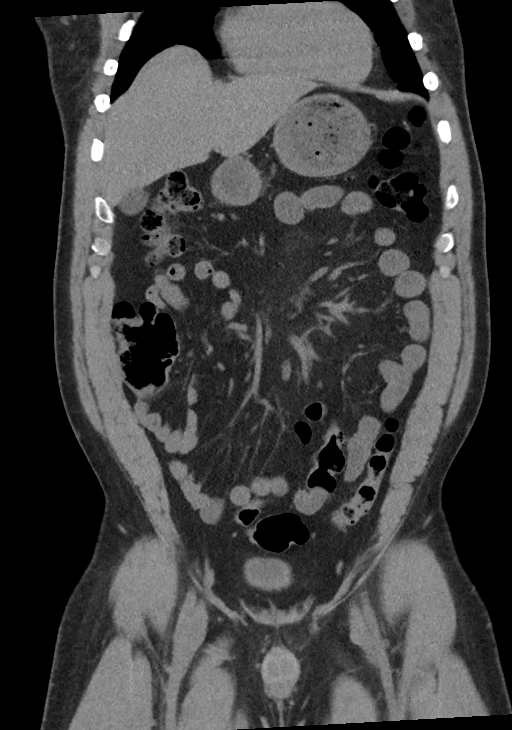
[im 45/100  soft-tissue]
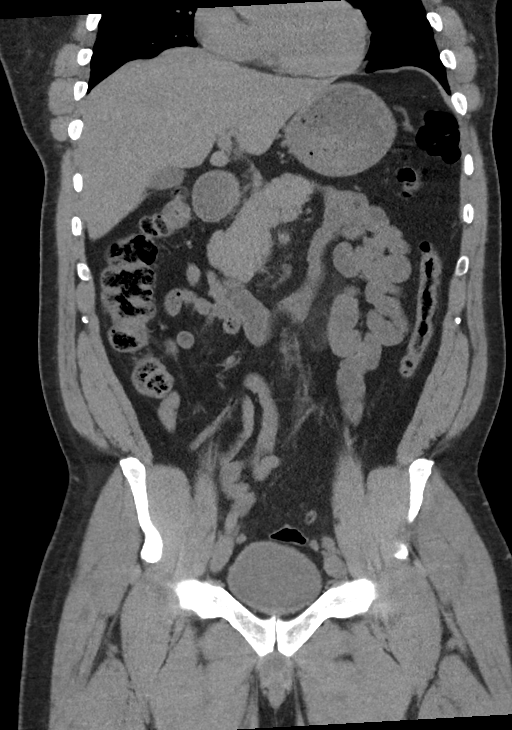
[im 56/100  soft-tissue]
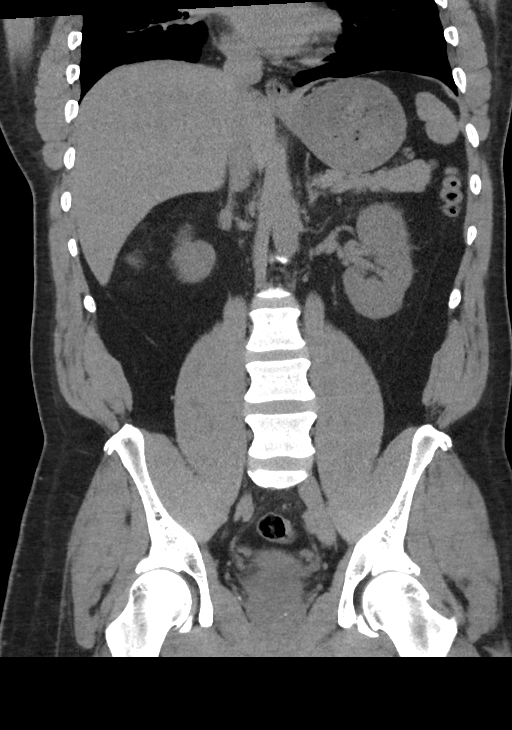

[16 of 46 positions shown; findings below may reference images not displayed]

FINDINGS: Lower chest: No significant abnormality.

Hepatobiliary: No focal liver abnormality is seen. No gallstones,
gallbladder wall thickening, or biliary dilatation.

Pancreas: Unremarkable. No pancreatic ductal dilatation or
surrounding inflammatory changes.

Spleen: Normal in size without focal abnormality.

Adrenals/Urinary Tract: Adrenal glands are unremarkable. Kidneys are
normal, without renal calculi, focal lesion, or hydronephrosis.
Bladder is unremarkable.

Stomach/Bowel: Stomach is within normal limits. Appendix appears
normal. No evidence of bowel wall thickening, distention, or
inflammatory changes.

Vascular/Lymphatic: No significant vascular findings are present. No
enlarged abdominal or pelvic lymph nodes.

Reproductive: Prostate is unremarkable.

Other: No abdominal wall hernia or abnormality. No abdominopelvic
ascites.

Musculoskeletal: No acute or significant osseous findings.
IMPRESSION: Benign-appearing abdomen and pelvis.

## 2019-08-30 ENCOUNTER — Other Ambulatory Visit: Payer: Self-pay

## 2019-08-30 ENCOUNTER — Encounter: Payer: Self-pay | Admitting: Psychiatry

## 2019-08-30 ENCOUNTER — Ambulatory Visit (HOSPITAL_COMMUNITY): Payer: Medicaid Other | Admitting: Psychiatry

## 2019-08-30 ENCOUNTER — Ambulatory Visit (INDEPENDENT_AMBULATORY_CARE_PROVIDER_SITE_OTHER): Payer: Medicaid Other | Admitting: Psychiatry

## 2019-08-30 DIAGNOSIS — F325 Major depressive disorder, single episode, in full remission: Secondary | ICD-10-CM

## 2019-08-30 MED ORDER — SERTRALINE HCL 100 MG PO TABS: 150.0000 mg | ORAL_TABLET | Freq: Every day | ORAL | 0 refills | Status: DC

## 2019-08-30 NOTE — Progress Notes (Signed)
[place] MD OP Progress Note  I connected with  Dale Bender on 08/30/19 by a video enabled telemedicine application and verified that I am speaking with the correct person using two identifiers.   I discussed the limitations of evaluation and management by telemedicine. The patient expressed understanding and agreed to proceed.    08/30/2019 2:24 PM Dale Bender  MRN:  [id number]  Chief Complaint: " I am doing good."  HPI: As per EMR, patient was hospitalized due to COVID-19 related respiratory distress back in December around Christmas time. Today patient reported that he is doing much better.  He has now started taking walks around the house.  He feels much better compared to a few days ago.  He informed that sertraline has been helping his mood and anxiety.  He informed he had an anxiety attack in early December but since then his anxiety has been much controlled.  He has been sleeping well.  He denied any acute issues or concerns at this time.  Visit Diagnosis:    ICD-10-CM   1. Major depressive disorder with single episode, in full remission ([place])  F32.5 sertraline (ZOLOFT) 100 MG tablet    Past Psychiatric History: depression, anxiety  Past Medical History:  Past Medical History:  Diagnosis Date  . GERD (gastroesophageal reflux disease)   . Prostate nodule    benign  . Seizure ([place])   . 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 FRACTURE SURGERY     LT arm, LT leg, Rt leg  . PROSTATE BIOPSY    . URETHRAL STRICTURE DILATATION      Family Psychiatric History: denied  Family History:  Family History  Problem Relation Age of Onset  . Hypertension Mother   . Diabetes Mother   . Other Father        died at age 85 in accident - log fell on him  . Kidney cancer Maternal Aunt   . Prostate cancer Paternal Uncle     Social History:  Social History   Socioeconomic History  . Marital status: Married    Spouse name: Not on  file  . Number of children: 2  . Years of education: 10th grade  . Highest education level: Not on file  Occupational History  . Occupation: tow Geophysical data processor  Tobacco Use  . Smoking status: Former Research scientist (life sciences)  . Smokeless tobacco: Never Used  Substance and Sexual Activity  . Alcohol use: Not Currently  . Drug use: Not Currently  . Sexual activity: Not on file  Other Topics Concern  . Not on file  Social History Narrative   Lives at home with his wife and children.   Right-handed.   2 cups caffeine per day.   Social Determinants of Health   Financial Resource Strain:   . Difficulty of Paying Living Expenses: Not on file  Food Insecurity:   . Worried About Charity fundraiser in the Last Year: Not on file  . Ran Out of Food in the Last Year: Not on file  Transportation Needs:   . Lack of Transportation (Medical): Not on file  . Lack of Transportation (Non-Medical): Not on file  Physical Activity:   . Days of Exercise per Week: Not on file  . Minutes of Exercise per Session: Not on file  Stress:   . Feeling of Stress : Not on file  Social Connections:   . Frequency of Communication with Friends  and Family: Not on file  . Frequency of Social Gatherings with Friends and Family: Not on file  . Attends Religious Services: Not on file  . Active Member of Clubs or Organizations: Not on file  . Attends Archivist Meetings: Not on file  . Marital Status: Not on file    Allergies: No Known Allergies  Metabolic Disorder Labs: No results found for: HGBA1C, MPG No results found for: PROLACTIN Lab Results  Component Value Date   TRIG 187 (H) 08/05/2019   Lab Results  Component Value Date   TSH 2.430 06/10/2019   TSH 1.87 06/01/2019    Therapeutic Level Labs: No results found for: LITHIUM No results found for: VALPROATE No components found for:  CBMZ  Current Medications: Current Outpatient Medications  Medication Sig Dispense Refill  . albuterol (VENTOLIN  HFA) 108 (90 Base) MCG/ACT inhaler Inhale 2 puffs into the lungs every 6 (six) hours as needed for wheezing or shortness of breath. 8 g 0  . guaiFENesin-dextromethorphan (ROBITUSSIN DM) 100-10 MG/5ML syrup Take 10 mLs by mouth every 4 (four) hours as needed for cough. 118 mL 0  . lamoTRIgine (LAMICTAL) 100 MG tablet Take one tablet every morning. 30 tablet 11  . LamoTRIgine 300 MG TB24 24 hour tablet Take 1 tablet (300 mg total) by mouth at bedtime. 30 tablet 11  . omeprazole (PRILOSEC) 40 MG capsule Take 40 mg by mouth daily.    . sertraline (ZOLOFT) 100 MG tablet Take 1.5 tablets (150 mg total) by mouth daily. 135 tablet 0   No current facility-administered medications for this visit.     Musculoskeletal: Strength & Muscle Tone: unable to assess due to telemed visit Gait & Station: unable to assess due to telemed visit Patient leans: unable to assess due to telemed visit  Psychiatric Specialty Exam: Review of Systems  There were no vitals taken for this visit.There is no height or weight on file to calculate BMI.  General Appearance: Well Groomed  Eye Contact:  Good  Speech:  Clear and Coherent and Normal Rate  Volume:  Normal  Mood:  Euthymic  Affect:  Congruent  Thought Process:  Goal Directed, Linear and Descriptions of Associations: Intact  Orientation:  Full (Time, Place, and Person)  Thought Content: Logical   Suicidal Thoughts:  No  Homicidal Thoughts:  No  Memory:  Recent;   Good Remote;   Good  Judgement:  Fair  Insight:  Fair  Psychomotor Activity:  Normal  Concentration:  Concentration: Good and Attention Span: Good  Recall:  Good  Fund of Knowledge: Good  Language: Good  Akathisia:  Negative  Handed:  Right  AIMS (if indicated): not done  Assets:  Communication Skills Desire for Improvement Financial Resources/Insurance Housing  ADL's:  Intact  Cognition: WNL  Sleep:  Good     Assessment and Plan: Patient reported doing much better compared to a few  weeks ago.  He reported his mood and anxiety are under control with sertraline.  1. Major depressive disorder with single episode, in full remission ([place])  - sertraline (ZOLOFT) 100 MG tablet; Take 1.5 tablets (150 mg total) by mouth daily.  Dispense: 135 tablet; Refill: 0  Continue same medication regimen. Follow up in 3 months.   [place] Crane, MD 08/30/2019, 2:24 PM

## 2019-09-08 ENCOUNTER — Telehealth: Payer: Self-pay | Admitting: Cardiovascular Disease

## 2019-09-08 ENCOUNTER — Telehealth (INDEPENDENT_AMBULATORY_CARE_PROVIDER_SITE_OTHER): Payer: Medicaid Other | Admitting: Cardiovascular Disease

## 2019-09-08 ENCOUNTER — Encounter: Payer: Self-pay | Admitting: Cardiovascular Disease

## 2019-09-08 VITALS — Ht 73.0 in | Wt 251.0 lb

## 2019-09-08 DIAGNOSIS — G4733 Obstructive sleep apnea (adult) (pediatric): Secondary | ICD-10-CM

## 2019-09-08 DIAGNOSIS — R0609 Other forms of dyspnea: Secondary | ICD-10-CM

## 2019-09-08 DIAGNOSIS — R0600 Dyspnea, unspecified: Secondary | ICD-10-CM

## 2019-09-08 DIAGNOSIS — R600 Localized edema: Secondary | ICD-10-CM

## 2019-09-08 NOTE — Progress Notes (Signed)
Virtual Visit via Telephone Note   This visit type was conducted due to national recommendations for restrictions regarding the COVID-19 Pandemic (e.g. social distancing) in an effort to limit this patient's exposure and mitigate transmission in our community.  Due to his co-morbid illnesses, this patient is at least at moderate risk for complications without adequate follow up.  This format is felt to be most appropriate for this patient at this time.  The patient did not have access to video technology/had technical difficulties with video requiring transitioning to audio format only (telephone).  All issues noted in this document were discussed and addressed.  No physical exam could be performed with this format.  Please refer to the patient's chart for his  consent to telehealth for [name].   Date:  09/08/2019   ID:  Dale Bender, DOB [birth year], MRN [id number]  Patient Location: Home Provider Location: Office  PCP:  Denny Levy, [place]  Cardiologist:  Kate Sable, MD  Electrophysiologist:  None   Evaluation Performed:  Follow-Up Visit  Chief Complaint: Dyspnea on exertion and bilateral leg edema  History of Present Illness:    Dale Bender is a 49 y.o. male with dyspnea on exertion and bilateral leg edema.  He underwent a normal nuclear stress test on 05/25/2019.  Chest x-ray was normal on 08/06/2019.  Echocardiogram on 07/28/2019 demonstrated normal LV systolic function, EF [id number], with grade 1 diastolic dysfunction.  Right ventricular systolic function was normal.  There were no significant valvular abnormalities.  He was hospitalized procedures in late December due to noncompliance with medications.  He was also noted to be COVID-19 positive.  He is now using an inhaler for his shortness of breath and it seems to be working.  His legs aren't swelling like they used to be.   Past Medical History:  Diagnosis Date  . GERD (gastroesophageal reflux  disease)   . Prostate nodule    benign  . Seizure ([place])   . Urethral stricture in male    Past Surgical History:  Procedure Laterality Date  . FRACTURE SURGERY     LT arm, LT leg, Rt leg  . PROSTATE BIOPSY    . URETHRAL STRICTURE DILATATION       Current Meds  Medication Sig  . albuterol (VENTOLIN HFA) 108 (90 Base) MCG/ACT inhaler Inhale 2 puffs into the lungs every 6 (six) hours as needed for wheezing or shortness of breath.  . lamoTRIgine (LAMICTAL) 100 MG tablet Take one tablet every morning.  . LamoTRIgine 300 MG TB24 24 hour tablet Take 1 tablet (300 mg total) by mouth at bedtime.  Marland Kitchen omeprazole (PRILOSEC) 40 MG capsule Take 40 mg by mouth daily.  . sertraline (ZOLOFT) 100 MG tablet Take 1.5 tablets (150 mg total) by mouth daily.     Allergies:   Patient has no known allergies.   Social History   Tobacco Use  . Smoking status: Former Research scientist (life sciences)  . Smokeless tobacco: Never Used  Substance Use Topics  . Alcohol use: Not Currently  . Drug use: Not Currently     Family Hx: The patient's family history includes Diabetes in his mother; Hypertension in his mother; Kidney cancer in his maternal aunt; Other in his father; Prostate cancer in his paternal uncle.  ROS:   Please see the history of present illness.     All other systems reviewed and are negative.   Prior CV studies:   The following studies were reviewed today:  Reviewed above  Labs/Other Tests and Data Reviewed:    EKG:  No ECG reviewed.  Recent Labs: 06/10/2019: TSH 2.430 08/06/2019: ALT 44; BUN 12; Creatinine, Ser 1.01; Hemoglobin 15.0; Magnesium 2.1; Platelets 170; Potassium 5.1; Sodium 138   Recent Lipid Panel Lab Results  Component Value Date/Time   TRIG 187 (H) 08/05/2019 02:01 PM    Wt Readings from Last 3 Encounters:  09/08/19 251 lb (113.9 kg)  08/05/19 250 lb (113.4 kg)  05/26/19 250 lb (113.4 kg)     Objective:    Vital Signs:  Ht 6\' 1"  (1.854 m)   Wt 251 lb (113.9 kg)   BMI  33.12 kg/m    VITAL SIGNS:  reviewed  ASSESSMENT & PLAN:    1.  Dyspnea on exertion: No cardiac etiology found. He is now using an inhaler for his shortness of breath and it seems to be working. May also be related to insufficiently treated sleep apnea for years.  2.  Bilateral leg edema: He only has grade 1 diastolic dysfunction.  No indication for diuretics at this time. Symptoms have improved.  3.  Obstructive sleep apnea.  I previously made a referral to a sleep specialist.  He has complex sleep apnea and insufficient treatment with CPAP.  He is now treated with BiPAP. He is waiting on this.    COVID-19 Education: The signs and symptoms of COVID-19 were discussed with the patient and how to seek care for testing (follow up with PCP or arrange E-visit).  The importance of social distancing was discussed today.  Time:   Today, I have spent 15 minutes with the patient with telehealth technology discussing the above problems.     Medication Adjustments/Labs and Tests Ordered: Current medicines are reviewed at length with the patient today.  Concerns regarding medicines are outlined above.   Tests Ordered: No orders of the defined types were placed in this encounter.   Medication Changes: No orders of the defined types were placed in this encounter.   Follow Up:  Virtual Visit  prn  Signed, Kate Sable, MD  09/08/2019 12:50 PM    [place]

## 2019-09-08 NOTE — Telephone Encounter (Signed)
Virtual Visit Pre-Appointment Phone Call  "(Name), I am calling you today to discuss your upcoming appointment. We are currently trying to limit exposure to the virus that causes COVID-19 by seeing patients at home rather than in the office."  1. "What is the BEST phone number to call the day of the visit?" [phone]  2. "Do you have or have access to (through a family member/friend) a smartphone with video capability that we can use for your visit?" a. If yes - list this number in appt notes as "cell" (if different from BEST phone #) and list the appointment type as a VIDEO visit in appointment notes b. If no - list the appointment type as a PHONE visit in appointment notes  3. Confirm consent - "In the setting of the current Covid19 crisis, you are scheduled for a (phone or video) visit with your provider on (date) at (time).  Just as we do with many in-office visits, in order for you to participate in this visit, we must obtain consent.  If you'd like, I can send this to your mychart (if signed up) or email for you to review.  Otherwise, I can obtain your verbal consent now.  All virtual visits are billed to your insurance company just like a normal visit would be.  By agreeing to a virtual visit, we'd like you to understand that the technology does not allow for your provider to perform an examination, and thus may limit your provider's ability to fully assess your condition. If your provider identifies any concerns that need to be evaluated in person, we will make arrangements to do so.  Finally, though the technology is pretty good, we cannot assure that it will always work on either your or our end, and in the setting of a video visit, we may have to convert it to a phone-only visit.  In either situation, we cannot ensure that we have a secure connection.  Are you willing to proceed?" STAFF: Did the patient verbally acknowledge consent to telehealth visit? Document YES/NO here:  YES  4. Advise patient to be prepared - "Two hours prior to your appointment, go ahead and check your blood pressure, pulse, oxygen saturation, and your weight (if you have the equipment to check those) and write them all down. When your visit starts, your provider will ask you for this information. If you have an Apple Watch or Kardia device, please plan to have heart rate information ready on the day of your appointment. Please have a pen and paper handy nearby the day of the visit as well."  5. Give patient instructions for MyChart download to smartphone OR Doximity/Doxy.me as below if video visit (depending on what platform provider is using)  6. Inform patient they will receive a phone call 15 minutes prior to their appointment time (may be from unknown caller ID) so they should be prepared to answer    TELEPHONE CALL NOTE  Dale Bender has been deemed a candidate for a follow-up tele-health visit to limit community exposure during the Covid-19 pandemic. I spoke with the patient via phone to ensure availability of phone/video source, confirm preferred email & phone number, and discuss instructions and expectations.  I reminded Dale Bender to be prepared with any vital sign and/or heart rhythm information that could potentially be obtained via home monitoring, at the time of his visit. I reminded Dale Bender to expect a phone call prior to his visit.  Vicky T  Slaughter 09/08/2019 10:56 AM   INSTRUCTIONS FOR DOWNLOADING THE MYCHART APP TO SMARTPHONE  - The patient must first make sure to have activated MyChart and know their login information - If Apple, go to [employer] and type in MyChart in the search bar and download the app. If Android, ask patient to go to [employer] and type in [place] in the search bar and download the app. The app is free but as with any other app downloads, their phone may require them to verify saved payment information or Apple/Android  password.  - The patient will need to then log into the app with their MyChart username and password, and select [facility] as their healthcare provider to link the account. When it is time for your visit, go to the MyChart app, find appointments, and click Begin Video Visit. Be sure to Select Allow for your device to access the Microphone and Camera for your visit. You will then be connected, and your provider will be with you shortly.  **If they have any issues connecting, or need assistance please contact MyChart service desk [phone])**  **If using a computer, in order to ensure the best quality for their visit they will need to use either of the following Internet Browsers: [employer], or Google Chrome**  IF USING DOXIMITY or DOXY.ME - The patient will receive a link just prior to their visit by text.     FULL LENGTH CONSENT FOR TELE-HEALTH VISIT   I hereby voluntarily request, consent and authorize [facility] and its employed or contracted physicians, physician assistants, nurse practitioners or other licensed health care professionals (the Practitioner), to provide me with telemedicine health care services (the "Services") as deemed necessary by the treating Practitioner. I acknowledge and consent to receive the Services by the Practitioner via telemedicine. I understand that the telemedicine visit will involve communicating with the Practitioner through live audiovisual communication technology and the disclosure of certain medical information by electronic transmission. I acknowledge that I have been given the opportunity to request an in-person assessment or other available alternative prior to the telemedicine visit and am voluntarily participating in the telemedicine visit.  I understand that I have the right to withhold or withdraw my consent to the use of telemedicine in the course of my care at any time, without affecting my right to future care or treatment,  and that the Practitioner or I may terminate the telemedicine visit at any time. I understand that I have the right to inspect all information obtained and/or recorded in the course of the telemedicine visit and may receive copies of available information for a reasonable fee.  I understand that some of the potential risks of receiving the Services via telemedicine include:  Marland Kitchen Delay or interruption in medical evaluation due to technological equipment failure or disruption; . Information transmitted may not be sufficient (e.g. poor resolution of images) to allow for appropriate medical decision making by the Practitioner; and/or  . In rare instances, security protocols could fail, causing a breach of personal health information.  Furthermore, I acknowledge that it is my responsibility to provide information about my medical history, conditions and care that is complete and accurate to the best of my ability. I acknowledge that Practitioner's advice, recommendations, and/or decision may be based on factors not within their control, such as incomplete or inaccurate data provided by me or distortions of diagnostic images or specimens that may result from electronic transmissions. I understand that the practice of  medicine is not an Chief Strategy Officer and that Practitioner makes no warranties or guarantees regarding treatment outcomes. I acknowledge that I will receive a copy of this consent concurrently upon execution via email to the email address I last provided but may also request a printed copy by calling the office of [facility].    I understand that my insurance will be billed for this visit.   I have read or had this consent read to me. . I understand the contents of this consent, which adequately explains the benefits and risks of the Services being provided via telemedicine.  . I have been provided ample opportunity to ask questions regarding this consent and the Services and have had my questions  answered to my satisfaction. . I give my informed consent for the services to be provided through the use of telemedicine in my medical care  By participating in this telemedicine visit I agree to the above.

## 2019-09-08 NOTE — Patient Instructions (Addendum)
Medication Instructions:   Your physician recommends that you continue on your current medications as directed. Please refer to the Current Medication list given to you today.  Labwork:  NONE  Testing/Procedures:  NONE  Follow-Up:  Your physician recommends that you schedule a follow-up appointment in: as needed.   Any Other Special Instructions Will Be Listed Below (If Applicable).  If you need a refill on your cardiac medications before your next appointment, please call your pharmacy. 

## 2019-09-16 ENCOUNTER — Other Ambulatory Visit: Payer: Self-pay

## 2019-09-16 ENCOUNTER — Encounter: Payer: Self-pay | Admitting: Neurology

## 2019-09-16 ENCOUNTER — Telehealth (INDEPENDENT_AMBULATORY_CARE_PROVIDER_SITE_OTHER): Payer: Medicaid Other | Admitting: Neurology

## 2019-09-16 ENCOUNTER — Ambulatory Visit: Payer: Medicaid Other | Admitting: Neurology

## 2019-09-16 VITALS — BP 139/83 | HR 104 | Temp 97.2°F | Ht 73.0 in | Wt 254.5 lb

## 2019-09-16 DIAGNOSIS — R29898 Other symptoms and signs involving the musculoskeletal system: Secondary | ICD-10-CM

## 2019-09-16 DIAGNOSIS — G4733 Obstructive sleep apnea (adult) (pediatric): Secondary | ICD-10-CM

## 2019-09-16 DIAGNOSIS — G40909 Epilepsy, unspecified, not intractable, without status epilepticus: Secondary | ICD-10-CM | POA: Diagnosis not present

## 2019-09-16 MED ORDER — LAMOTRIGINE ER 200 MG PO TB24: 400.0000 mg | ORAL_TABLET | Freq: Every day | ORAL | 11 refills | Status: DC

## 2019-09-16 NOTE — Telephone Encounter (Signed)
Sent referral to Lala Lund to schedule .

## 2019-09-16 NOTE — Telephone Encounter (Signed)
Please refer this patient to [facility] hospital Dr. Hortense Ramal for EMU monitoring

## 2019-09-16 NOTE — Progress Notes (Signed)
PATIENT: Dale Bender DOB: 1971/01/19  Chief Complaint  Patient presents with  . Seizures    Reports having two seizures since last here. One on 07/13/19 and the other on 08/05/19. Denies any missed doses of medication surrounding the events.  . Sleep Apnea    States he is working on getting his Bipap treatment set up.     HISTORICAL  Dale Bender is a 49 year old male, seen in request by his primary care physician from the [place] practice for evaluation of seizure, he is accompanied by his wife Dale Bender, initial evaluation was on July 22, 2018.  I have reviewed and summarized the hospital discharge and neurology consult note on November 22 and 24, 2019.  He was diagnosed with urethral restriction since October 2019, painful urination, on July 03, 2018, he was arranged for prostate biopsy in conjunction with IV Levaquin, apparently had a seizure-like event during the procedure, per patient, he had no recollection of the event, woke up in the hospital, reviewing hospital discharge, on the day of discharge on July 05, 2018, he had multiple seizure-like event witnessed by his wife and the physician, but immediately stopped with painful stimulation,  He presented to local hospital again on July 06, 2018 after he was discharged to home, wife reported multiple seizure-like spells, whole body trembling, body thrashing out of the bed, lasting for few minutes, multiple episode in a day, patient stated that he had no recollection of the event, did not have loss of consciousness.   Since hospital discharge, he also complains of right leg pain, weakness, sensory loss, difficulty walking, using walker at home,  He drives tow machine, heavy equipment operation  Personally reviewed MRI of the brain on July 03, 2018, motion degraded imaging, no acute abnormality identified,  Right leg has no feelings, walk with walker, left leg is ok, he has in  continence.  EEG on Jul 04 2018: normal.  Laboratory evaluation showed normal or negative alcohol, HIV, UDS was positive for opiates, benzodiazepine, CMP, CBC  UPDATE Sept 1 2020: He had 2 seizure activity, all  happened during sleep, wake him up from sleep, generalized tonic-clonic seizure, in June, and on March 24, 2019,  EEG was abnormal on April 05, 2019, there are frequent bilateral frontotemporal dominance 3 Hz spike slow activity, consistent with generalized epilepsy disorder.  He was taking lamotrigine 100 mg twice a day, now increased to lamotrigine ER 300 mg every night  He continue complains of low back pain, right leg numbness, weakness, using bilateral crutches to walk, variable effort during today's examination  I personally reviewed MRI of lumbar spine December 2019, with no significant abnormality noted.  EMG nerve conduction study was normal in January 2020, there is no evidence of right lower extremity neuropathy or right lumbosacral radiculopathy  Laboratory evaluations in September 2019, normal CBC, CMP showed elevated creatinine 1.4, negative HIV, UDS was positive for benzodiazepine and opiates.  UPDATE Jun 10 2019: Last recurrent seizure was on May 17, 2019, lamotrigine increased to 100 mg in the morning, plus lamotrigine ER 300 mg every night, he continue complains of right leg numbness, weakness, ambulate with crutches, also complains of urinary frequency urgency, sometimes cannot make it  UPDATE Sep 16 2019: He was approved for disability in November 2020, also was diagnosed with obstructive sleep apnea, CPAP machine is pending, he complains of excessive daytime fatigue, sleepiness, often missing his morning dose of lamotrigine 100 mg,  He had 2 recurrent spells  in December, no warning signs, passing out, there was a witnessed seizure-like activity on December 1, again on August 05, 2019.  He was admitted to the hospital on August 06, 2019, he was  positive for Covid, was admitted for increased shortness of breath, when the EMS reach patient at home, patient was unresponsive, hypotensive, was having tonic-clonic seizure-like activity, he was given 2 doses of epinephrine, and Solu-Medrol IV, then began to responsive again, was later treated with Decadron at the hospital,  Today he came in without a walker, complains of right leg numbness, but no significant gait abnormality  I personally reviewed MRI of thoracic, cervical spine in November 2020, mild degenerative changes, but no evidence of canal or foraminal narrowing,  REVIEW OF SYSTEMS: Full 14 system review of systems performed and notable only for above All other review of systems were negative.  ALLERGIES: No Known Allergies  HOME MEDICATIONS: Current Outpatient Medications  Medication Sig Dispense Refill  . albuterol (VENTOLIN HFA) 108 (90 Base) MCG/ACT inhaler Inhale 2 puffs into the lungs every 6 (six) hours as needed for wheezing or shortness of breath. 8 g 0  . lamoTRIgine (LAMICTAL) 100 MG tablet Take one tablet every morning. 30 tablet 11  . LamoTRIgine 300 MG TB24 24 hour tablet Take 1 tablet (300 mg total) by mouth at bedtime. 30 tablet 11  . omeprazole (PRILOSEC) 40 MG capsule Take 40 mg by mouth daily.    . sertraline (ZOLOFT) 100 MG tablet Take 1.5 tablets (150 mg total) by mouth daily. 135 tablet 0   No current facility-administered medications for this visit.    PAST MEDICAL HISTORY: Past Medical History:  Diagnosis Date  . GERD (gastroesophageal reflux disease)   . Prostate nodule    benign  . Seizure ([place])   . 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 FRACTURE SURGERY     LT arm, LT leg, Rt leg  . PROSTATE BIOPSY    . URETHRAL STRICTURE DILATATION      FAMILY HISTORY: Family History  Problem Relation Age of Onset  . Hypertension Mother   . Diabetes Mother   . Other Father         died at age 83 in accident - log fell on him  . Kidney cancer Maternal Aunt   . Prostate cancer Paternal Uncle     SOCIAL HISTORY: Social History   Socioeconomic History  . Marital status: Married    Spouse name: Not on file  . Number of children: 2  . Years of education: 10th grade  . Highest education level: Not on file  Occupational History  . Occupation: tow Geophysical data processor  Tobacco Use  . Smoking status: Former Research scientist (life sciences)  . Smokeless tobacco: Never Used  Substance and Sexual Activity  . Alcohol use: Not Currently  . Drug use: Not Currently  . Sexual activity: Not on file  Other Topics Concern  . Not on file  Social History Narrative   Lives at home with his wife and children.   Right-handed.   2 cups caffeine per day.   Social Determinants of Health   Financial Resource Strain:   . Difficulty of Paying Living Expenses: Not on file  Food Insecurity:   . Worried About Charity fundraiser in the Last Year: Not on file  . Ran Out of Food in the Last Year: Not on file  Transportation Needs:   .  Lack of Transportation (Medical): Not on file  . Lack of Transportation (Non-Medical): Not on file  Physical Activity:   . Days of Exercise per Week: Not on file  . Minutes of Exercise per Session: Not on file  Stress:   . Feeling of Stress : Not on file  Social Connections:   . Frequency of Communication with Friends and Family: Not on file  . Frequency of Social Gatherings with Friends and Family: Not on file  . Attends Religious Services: Not on file  . Active Member of Clubs or Organizations: Not on file  . Attends Archivist Meetings: Not on file  . Marital Status: Not on file  Intimate Partner Violence:   . Fear of Current or Ex-Partner: Not on file  . Emotionally Abused: Not on file  . Physically Abused: Not on file  . Sexually Abused: Not on file     PHYSICAL EXAM   Vitals:   09/16/19 0928  BP: 139/83  Pulse: (!) 104  Temp: (!) 97.2 F (36.2 C)   Weight: 254 lb 8 oz (115.4 kg)  Height: 6\' 1"  (1.854 m)    Not recorded      Body mass index is 33.58 kg/m.  PHYSICAL EXAMNIATION:  Gen: NAD, conversant, well nourised, well groomed                     Cardiovascular: Regular rate rhythm, no peripheral edema, warm, nontender. Eyes: Conjunctivae clear without exudates or hemorrhage Neck: Supple, no carotid bruits. Pulmonary: Clear to auscultation bilaterally   NEUROLOGICAL EXAM:  MENTAL STATUS: Speech:    Speech is normal; fluent and spontaneous with normal comprehension.  Cognition:     Orientation to time, place and person     Normal recent and remote memory     Normal Attention span and concentration     Normal Language, naming, repeating,spontaneous speech     Fund of knowledge   CRANIAL NERVES: CN II: Visual fields are full to confrontation.  Pupils are round equal and briskly reactive to light. CN III, IV, VI: extraocular movement are normal. No ptosis. CN V: Facial sensation is intact to pinprick in all 3 divisions bilaterally. Corneal responses are intact.  CN VII: Face is symmetric with normal eye closure and smile. CN VIII: Hearing is normal to rubbing fingers CN IX, X: Palate elevates symmetrically. Phonation is normal. CN XI: Head turning and shoulder shrug are intact CN XII: Tongue is midline with normal movements and no atrophy.  MOTOR: Bilateral upper extremity, and left lower extremity muscle strength was normal, variable effort on motor exam of right leg.  REFLEXES: Reflexes are 2+ and symmetric at the biceps, triceps, knees, and ankles. Plantar responses are flexor.  SENSORY: Reported decreased light touch pinprick and vibratory sensation at right lower extremity  COORDINATION: Rapid alternating movements and fine finger movements are intact. There is no dysmetria on finger-to-nose and heel-knee-shin.    GAIT/STANCE: He can get up from seated position arms crossed, dragging his right leg mildly,  steady  DIAGNOSTIC DATA (LABS, IMAGING, TESTING) - I reviewed patient records, labs, notes, testing and imaging myself where available.   ASSESSMENT AND PLAN  SHIYA KILIAN is a 49 y.o. male     Epilepsy  Most recent event was on August 05, 2019,  EEG on April 05, 2019 showed findings suggestive of generalized seizure, but later repeat EEG on September 21, June 16, 2019 was surprisingly normal.  He continue recurrent  generalized tonic clonic seizure activity despite lamotrigine 400 mg a day,  There was clearly functional component during his previous examinations, he claims difficulty walking, bilateral lower extremity weakness, despite normal EMG nerve conduction study, MRI of the brain, cervical, thoracic, lumbar spine show no significant abnormality.  Will change his lamotrigine ER from 300 to 200mg  2 tablets every night, check lamotrigine level today  Refer to in hospital video EEG monitoring by Dr. Hortense Ramal  Obstructive sleep apnea  Continue follow-up with sleep clinic    Marcial Pacas, M.D. Ph.D.  [facility] [address] [place], [place] [address] Ph: [phone] Fax: [phone]  CC: Practice, [facility]

## 2019-09-17 LAB — LAMOTRIGINE LEVEL: Lamotrigine Lvl: 8.2 ug/mL (ref 2.0–20.0)

## 2019-09-27 NOTE — Telephone Encounter (Signed)
Patient wife called stating that they were advised patient would need to have someone with him at all times while he is being monitored and they are unable to find someone that is able to stay with him.

## 2019-10-01 ENCOUNTER — Other Ambulatory Visit (HOSPITAL_COMMUNITY)
Admission: RE | Admit: 2019-10-01 | Discharge: 2019-10-01 | Disposition: A | Discharge status: Home / Self Care | Payer: Medicaid Other | Source: Ambulatory Visit | Attending: Neurology | Admitting: Neurology

## 2019-10-01 DIAGNOSIS — Z01812 Encounter for preprocedural laboratory examination: Secondary | ICD-10-CM | POA: Diagnosis not present

## 2019-10-01 DIAGNOSIS — Z20822 Contact with and (suspected) exposure to covid-19: Secondary | ICD-10-CM | POA: Diagnosis not present

## 2019-10-01 LAB — SARS CORONAVIRUS 2 (TAT 6-24 HRS): SARS Coronavirus 2: NEGATIVE

## 2019-10-01 NOTE — Progress Notes (Signed)
Patient presented to [facility] testing site for pre-surgery, patient stated that he had a positive test on 08/05/19. There are no results of that positive result in the Epic system.  Instructed patient that he will need to get a copy of that positive test before his procedure next week.  The patient stated that he called a 1-800 number and went to a tent in [place], [place] where he got tested. I explained that we do not retest patients for 90 days after his positive test.  If the patient tests positive again then his procedure may be delayed for 10 days after the positive test.  Explained that if he can get a copy of that positive test then he would be about to have his procedure even if he were to test positive again for a second time.  Patient was tested today because he is not sure if he can get a copy of the test results from 08/05/19. Patient verbalized understanding of the instructions and had no further questions.  I told the patient that I would contact him tomorrow with his results

## 2019-10-04 ENCOUNTER — Other Ambulatory Visit: Payer: Self-pay

## 2019-10-04 ENCOUNTER — Encounter (HOSPITAL_COMMUNITY): Payer: Self-pay | Admitting: Neurology

## 2019-10-04 ENCOUNTER — Inpatient Hospital Stay (HOSPITAL_COMMUNITY)
Admission: RE | Admit: 2019-10-04 | Discharge: 2019-10-05 | DRG: 880 | Disposition: A | Discharge status: Home / Self Care | Payer: Medicaid Other | Source: Ambulatory Visit | Attending: Neurology | Admitting: Neurology

## 2019-10-04 ENCOUNTER — Inpatient Hospital Stay (HOSPITAL_COMMUNITY): Payer: Medicaid Other

## 2019-10-04 DIAGNOSIS — F445 Conversion disorder with seizures or convulsions: Secondary | ICD-10-CM | POA: Diagnosis present

## 2019-10-04 DIAGNOSIS — G4733 Obstructive sleep apnea (adult) (pediatric): Secondary | ICD-10-CM | POA: Diagnosis present

## 2019-10-04 DIAGNOSIS — Z23 Encounter for immunization: Secondary | ICD-10-CM | POA: Diagnosis not present

## 2019-10-04 DIAGNOSIS — K219 Gastro-esophageal reflux disease without esophagitis: Secondary | ICD-10-CM

## 2019-10-04 DIAGNOSIS — R569 Unspecified convulsions: Secondary | ICD-10-CM | POA: Diagnosis present

## 2019-10-04 DIAGNOSIS — Z87891 Personal history of nicotine dependence: Secondary | ICD-10-CM | POA: Diagnosis not present

## 2019-10-04 DIAGNOSIS — N179 Acute kidney failure, unspecified: Secondary | ICD-10-CM | POA: Diagnosis present

## 2019-10-04 DIAGNOSIS — F329 Major depressive disorder, single episode, unspecified: Secondary | ICD-10-CM | POA: Diagnosis present

## 2019-10-04 DIAGNOSIS — Z79899 Other long term (current) drug therapy: Secondary | ICD-10-CM

## 2019-10-04 LAB — CBC WITH DIFFERENTIAL/PLATELET
Abs Immature Granulocytes: 0.01 10*3/uL (ref 0.00–0.07)
Basophils Absolute: 0 10*3/uL (ref 0.0–0.1)
Basophils Relative: 0 %
Eosinophils Absolute: 0.1 10*3/uL (ref 0.0–0.5)
Eosinophils Relative: 1 %
HCT: 40.2 % (ref 39.0–52.0)
Hemoglobin: 13.4 g/dL (ref 13.0–17.0)
Immature Granulocytes: 0 %
Lymphocytes Relative: 27 %
Lymphs Abs: 1.5 10*3/uL (ref 0.7–4.0)
MCH: 28.5 pg (ref 26.0–34.0)
MCHC: 33.3 g/dL (ref 30.0–36.0)
MCV: 85.5 fL (ref 80.0–100.0)
Monocytes Absolute: 0.6 10*3/uL (ref 0.1–1.0)
Monocytes Relative: 10 %
Neutro Abs: 3.3 10*3/uL (ref 1.7–7.7)
Neutrophils Relative %: 62 %
Platelets: 177 10*3/uL (ref 150–400)
RBC: 4.7 MIL/uL (ref 4.22–5.81)
RDW: 12.9 % (ref 11.5–15.5)
WBC: 5.4 10*3/uL (ref 4.0–10.5)
nRBC: 0 % (ref 0.0–0.2)

## 2019-10-04 LAB — COMPREHENSIVE METABOLIC PANEL
ALT: 31 U/L (ref 0–44)
AST: 27 U/L (ref 15–41)
Albumin: 4.2 g/dL (ref 3.5–5.0)
Alkaline Phosphatase: 55 U/L (ref 38–126)
Anion gap: 11 (ref 5–15)
BUN: 12 mg/dL (ref 6–20)
CO2: 25 mmol/L (ref 22–32)
Calcium: 9.1 mg/dL (ref 8.9–10.3)
Chloride: 105 mmol/L (ref 98–111)
Creatinine, Ser: 1.41 mg/dL — ABNORMAL HIGH (ref 0.61–1.24)
GFR calc Af Amer: >60 mL/min (ref >60)
GFR calc non Af Amer: 58 mL/min — ABNORMAL LOW (ref >60)
Glucose, Bld: 88 mg/dL (ref 70–99)
Potassium: 4.3 mmol/L (ref 3.5–5.1)
Sodium: 141 mmol/L (ref 135–145)
Total Bilirubin: 0.9 mg/dL (ref 0.3–1.2)
Total Protein: 6.3 g/dL — ABNORMAL LOW (ref 6.5–8.1)

## 2019-10-04 LAB — RAPID URINE DRUG SCREEN, HOSP PERFORMED
Amphetamines: NOT DETECTED
Barbiturates: NOT DETECTED
Benzodiazepines: NOT DETECTED
Cocaine: NOT DETECTED
Opiates: NOT DETECTED
Tetrahydrocannabinol: NOT DETECTED

## 2019-10-04 LAB — MAGNESIUM: Magnesium: 1.9 mg/dL (ref 1.7–2.4)

## 2019-10-04 LAB — PHOSPHORUS: Phosphorus: 1.4 mg/dL — ABNORMAL LOW (ref 2.5–4.6)

## 2019-10-04 LAB — PROTIME-INR
INR: 1 (ref 0.8–1.2)
Prothrombin Time: 13 seconds (ref 11.4–15.2)

## 2019-10-04 MED ORDER — ALBUTEROL SULFATE HFA 108 (90 BASE) MCG/ACT IN AERS: 2.0000 | INHALATION_SPRAY | Freq: Four times a day (QID) | RESPIRATORY_TRACT | Status: DC | PRN

## 2019-10-04 MED ORDER — LAMOTRIGINE 100 MG PO TABS
200.0000 mg | ORAL_TABLET | Freq: Every day | ORAL | Status: DC
  Administered 2019-10-04: 200 mg via ORAL
  Filled 2019-10-04: qty 2

## 2019-10-04 MED ORDER — LAMOTRIGINE 100 MG PO TABS: 200.0000 mg | ORAL_TABLET | Freq: Every day | ORAL | Status: DC

## 2019-10-04 MED ORDER — SODIUM PHOSPHATES 45 MMOLE/15ML IV SOLN
30.0000 mmol | Freq: Once | INTRAVENOUS | Status: AC
  Administered 2019-10-04: 30 mmol via INTRAVENOUS
  Filled 2019-10-04: qty 10

## 2019-10-04 MED ORDER — ENOXAPARIN SODIUM 40 MG/0.4ML ~~LOC~~ SOLN
40.0000 mg | SUBCUTANEOUS | Status: DC
  Administered 2019-10-04 – 2019-10-05 (×2): 40 mg via SUBCUTANEOUS
  Filled 2019-10-04 (×2): qty 0.4

## 2019-10-04 MED ORDER — SERTRALINE HCL 50 MG PO TABS
150.0000 mg | ORAL_TABLET | Freq: Every day | ORAL | Status: DC
  Administered 2019-10-05: 150 mg via ORAL
  Filled 2019-10-04: qty 1

## 2019-10-04 MED ORDER — PANTOPRAZOLE SODIUM 40 MG PO TBEC: 80.0000 mg | DELAYED_RELEASE_TABLET | Freq: Every day | ORAL | Status: DC

## 2019-10-04 MED ORDER — LABETALOL HCL 5 MG/ML IV SOLN: 5.0000 mg | INTRAVENOUS | Status: DC | PRN

## 2019-10-04 MED ORDER — ALBUTEROL SULFATE (2.5 MG/3ML) 0.083% IN NEBU: 3.0000 mL | INHALATION_SOLUTION | Freq: Four times a day (QID) | RESPIRATORY_TRACT | Status: DC | PRN

## 2019-10-04 MED ORDER — INFLUENZA VAC SPLIT QUAD 0.5 ML IM SUSY
0.5000 mL | PREFILLED_SYRINGE | INTRAMUSCULAR | Status: AC
  Administered 2019-10-05: 10:00:00 0.5 mL via INTRAMUSCULAR
  Filled 2019-10-04: qty 0.5

## 2019-10-04 MED ORDER — SODIUM CHLORIDE 0.9% FLUSH
3.0000 mL | Freq: Two times a day (BID) | INTRAVENOUS | Status: DC
  Administered 2019-10-04 – 2019-10-05 (×3): 3 mL via INTRAVENOUS

## 2019-10-04 MED ORDER — LAMOTRIGINE ER 200 MG PO TB24: 200.0000 mg | ORAL_TABLET | Freq: Every day | ORAL | Status: DC

## 2019-10-04 MED ORDER — ACETAMINOPHEN 325 MG PO TABS: 650.0000 mg | ORAL_TABLET | ORAL | Status: DC | PRN

## 2019-10-04 MED ORDER — SERTRALINE HCL 50 MG PO TABS: 150.0000 mg | ORAL_TABLET | Freq: Every day | ORAL | Status: DC

## 2019-10-04 MED ORDER — SODIUM CHLORIDE 0.9 % IV SOLN: Freq: Once | INTRAVENOUS | Status: AC

## 2019-10-04 MED ORDER — PANTOPRAZOLE SODIUM 40 MG PO TBEC
80.0000 mg | DELAYED_RELEASE_TABLET | Freq: Every day | ORAL | Status: DC
  Administered 2019-10-04 – 2019-10-05 (×2): 80 mg via ORAL
  Filled 2019-10-04 (×3): qty 2

## 2019-10-04 MED ORDER — LORAZEPAM 2 MG/ML IJ SOLN
2.0000 mg | INTRAMUSCULAR | Status: DC | PRN
  Filled 2019-10-04 (×2): qty 1

## 2019-10-04 NOTE — Care Plan (Signed)
Received message from monitoring tech that patient is having an event at around 0930pm. Reviewed eeg. No eeg change to suggest seizure. Informed RN that its a non epileptic event and no need to administer ativan. Recommend checking vitals in 15 mins followed by 30 mins. RN acknowledged understanding plan.  Calypso Hagarty Barbra Sarks

## 2019-10-04 NOTE — Progress Notes (Signed)
Patient arrived to 3w14 for EMU. A&O x4, skin intact. Wife at bedside. States no pain. Tele placed. Oriented to room. Call light within reach, bed in lowest position. [place]

## 2019-10-04 NOTE — Progress Notes (Signed)
EMU hookup complete - no initial skin breakdown - push button tested - tech info checklist complete.

## 2019-10-04 NOTE — Progress Notes (Signed)
Patient had an event of staring, not following commands. VSS WNL. EEG tech placing electrodes on patient's head. Dr. Hortense Ramal called to bedside. [place]

## 2019-10-04 NOTE — Progress Notes (Deleted)
EEG complete - results pending 
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
## 2019-10-05 DIAGNOSIS — F445 Conversion disorder with seizures or convulsions: Principal | ICD-10-CM

## 2019-10-05 LAB — BASIC METABOLIC PANEL
Anion gap: 7 (ref 5–15)
BUN: 11 mg/dL (ref 6–20)
CO2: 27 mmol/L (ref 22–32)
Calcium: 8.8 mg/dL — ABNORMAL LOW (ref 8.9–10.3)
Chloride: 109 mmol/L (ref 98–111)
Creatinine, Ser: 1.38 mg/dL — ABNORMAL HIGH (ref 0.61–1.24)
GFR calc Af Amer: >60 mL/min (ref >60)
GFR calc non Af Amer: 60 mL/min — ABNORMAL LOW (ref >60)
Glucose, Bld: 98 mg/dL (ref 70–99)
Potassium: 4.8 mmol/L (ref 3.5–5.1)
Sodium: 143 mmol/L (ref 135–145)

## 2019-10-05 LAB — PHOSPHORUS: Phosphorus: 3.4 mg/dL (ref 2.5–4.6)

## 2019-10-05 MED ORDER — SODIUM CHLORIDE 0.9 % IV SOLN: INTRAVENOUS | Status: DC

## 2019-10-05 MED ORDER — ONDANSETRON HCL 4 MG/2ML IJ SOLN
4.0000 mg | Freq: Once | INTRAMUSCULAR | Status: AC
  Administered 2019-10-05: 4 mg via INTRAVENOUS
  Filled 2019-10-05: qty 2

## 2019-10-05 MED ORDER — CALCIUM CARBONATE ANTACID 500 MG PO CHEW
1.0000 | CHEWABLE_TABLET | Freq: Once | ORAL | Status: AC
  Administered 2019-10-05: 200 mg via ORAL
  Filled 2019-10-05: qty 1

## 2019-10-05 NOTE — Discharge Summary (Addendum)
Physician Discharge Summary  Patient ID: Dale Dale Bender MRN: [id number] DOB/AGE: 16-Jun-1971 49 y.o.  Admit date: 10/04/2019 Discharge date: 10/05/2019  Admission Diagnoses: Convulsion  Discharge Diagnoses: Pseudoseizure/ Non epileptic event  Discharged Condition: stable  Hospital Course: Dale Dale Bender was admitted to EMU from 10/04/2019 to 10/05/2019. Two typical events  (confirmed by wife) were recorded during this stay. During first event, patient was unresponsive and had BL lower extremity stiffening, eyes closed. Patient was not hooked to EEG during this event. However, semiology of the event was consistent with non epileptic event. Second event was recorded in evening on 10/04/2019 at 2114 during which patient had side to side head movements for almost 10 minutes followed by whole body non rhythmic shaking. Concomitant eeg before, during and after the event showed normal posterior dominant rhythm. This was a non-epileptic event. Diagnosis was discussed with patient and his wife, including management strategies, cognitive behavorial therapy.  I also discussed that patient has had an abnormal EEG in the past on 04/05/2019 which reportedly showed evidence of bilateral temporal parietal lobe dominant 3 Hz spike slow wave activities, consistent with generalized epilepsy disorder.  However, patients may have abnormal EEG and never have epilepsy/epileptic seizures.  In future, if he has staring spells or the semiology of his episodes change, we can consider another video EEG evaluation at that point.  Patient was noted to have hypophosphatemia which was replaced. Mild AKI ( creat 1.41-->1.38) was also noted, IV NS was given and patient was encouraged to increase fluid intake.   Consults: None  Significant Diagnostic Studies:  EEG: This study is within normal limits. No seizures or epileptiform discharges were seen throughout the recording.  One event was recorded on 10/04/2019 at 2114 as described above  without EEG change and was a nonepileptic event.  Treatments: Continue Lamotrigine 400mg  daily  Discharge Exam: Blood pressure 127/83, pulse 81, temperature 98.5 F (36.9 C), temperature source Oral, resp. rate 16, height 6\' 1"  (1.854 m), weight 114.5 kg, SpO2 97 %.  Constitutional: Appears well-developed and well-nourished.  Psych: Affect appropriate to situation Eyes: No scleral injection HENT: No OP obstrucion Head: Normocephalic.  Cardiovascular: Normal rate and regular rhythm.  Respiratory: Effort normal, non-labored breathing GI: Soft.  No distension. There is no tenderness.  Skin: WDI Neuro:AOX3, follows commands,  CN 2-12 grossly intact, 5/5 strength was present in all four extremities, reports absence of tight sensation in right lower extremity (chronic)  Disposition: Discharge disposition: 01-Home or Self Care       Discharge Instructions    Diet - low sodium heart healthy   Complete by: As directed    Increase activity slowly   Complete by: As directed    Seizure precautions     Allergies as of 10/05/2019   No Known Allergies     Medication List    TAKE these medications   albuterol 108 (90 Base) MCG/ACT inhaler Commonly known as: VENTOLIN HFA Inhale 2 puffs into the lungs every 6 (six) hours as needed for wheezing or shortness of breath.   LamoTRIgine 200 MG Tb24 24 hour tablet Take 2 tablets (400 mg total) by mouth at bedtime.   omeprazole 40 MG capsule Commonly known as: PRILOSEC Take 40 mg by mouth daily.   sertraline 100 MG tablet Commonly known as: ZOLOFT Take 1.5 tablets (150 mg total) by mouth daily.        Signed: Lora Havens 10/05/2019, 10:31 AM    I have spent a total of  45 minutes with the patient reviewing hospital notes,  test results, labs and examining the patient as well as establishing an assessment and plan that was discussed personally with the patient and his wife.  > 50% of time was spent in direct patient care.

## 2019-10-05 NOTE — TOC Transition Note (Signed)
Transition of Care [facility]) - CM/SW Discharge Note   Patient Details  Name: Dale Bender MRN: [id number] Date of Birth: 1971/03/27  Transition of Care [facility] CM/SW Contact:  Pollie Friar, RN Phone Number: 10/05/2019, 1:04 PM   Clinical Narrative:    Pt discharging home with self care. No needs per CM.   Final next level of care: Home/Self Care Barriers to Discharge: No Barriers Identified   Patient Goals and CMS Choice        Discharge Placement                       Discharge Plan and Services                                     Social Determinants of Health (SDOH) Interventions     Readmission Risk Interventions No flowsheet data found.

## 2019-10-05 NOTE — Discharge Instructions (Addendum)
You were admitted to Epilepsy Monitoring Unit from 10/04/2019 to 10/05/2019. We recorded two events during this admission. During the first event, you were not hooked to EEG. During the second event on 10/04/2019 at 2114, you were noted to have side to side head movement followed by whole body jerking. Your eeg during the episode was normal. This was a non epileptic event. Your diagnosis was discussed with you including management strategies, cognitive behavioural therapy. Recommend continuing Lamotrigine 400mg  daily. Follow up with Dr Krista Blue ( primary neurologist). Please continue seizure precautions, including DO NOT DRIVE  Of note, you were noted to have low phosphorus which was replaced. You were also noted to have mild kidney injury, you were given IV fluids. Recommend fluid intake after discharge and follow up with your primary care.    Per [facility] statutes, patients with seizures are not allowed to drive until they have been seizure-free for six months. Patient acknowledged,repeated, and understands.    Use caution when using heavy equipment or power tools. Avoid working on ladders or at heights. Take showers instead of baths. Ensure the water temperature is not too high on the home water heater. Do not go swimming alone. Do not lock yourself in a room alone (i.e. bathroom). When caring for infants or small children, sit down when holding, feeding, or changing them to minimize risk of injury to the child in the event you have a seizure. Maintain good sleep hygiene. Avoid alcohol.    If patient has another seizure, call 911 and bring them back to the ED if: A.  Any seizure-like activity or alteration of mentation or LOC - especially if > 5 mins.      B.  The patient doesn't wake shortly after the seizure or has new problems such as difficulty seeing, speaking or moving following the seizure C.  The patient was injured during the seizure D.  The patient has a temperature over 102 F (39C) E.   The patient vomited during the seizure and now is having trouble breathing   Per [facility] statutes, patients with seizures are not allowed to drive until they have been seizure-free for six months.  Other recommendations include using caution when using heavy equipment or power tools. Avoid working on ladders or at heights. Take showers instead of baths.  Do not swim alone.  Ensure the water temperature is not too high on the home water heater. Do not go swimming alone. Do not lock yourself in a room alone (i.e. bathroom). When caring for infants or small children, sit down when holding, feeding, or changing them to minimize risk of injury to the child in the event you have a seizure. Maintain good sleep hygiene. Avoid alcohol.  Also recommend adequate sleep, hydration, good diet and minimize stress.   During the Seizure   - First, ensure adequate ventilation and place patients on the floor on their left side  Loosen clothing around the neck and ensure the airway is patent. If the patient is clenching the teeth, do not force the mouth open with any object as this can cause severe damage - Remove all items from the surrounding that can be hazardous. The patient may be oblivious to what's happening and may not even know what he or she is doing. If the patient is confused and wandering, either gently guide him/her away and block access to outside areas - Reassure the individual and be comforting - Call 911. In most cases, the seizure ends  before EMS arrives. However, there are cases when seizures may last over 3 to 5 minutes. Or the individual may have developed breathing difficulties or severe injuries. If a pregnant patient or a person with diabetes develops a seizure, it is prudent to call an ambulance. - Finally, if the patient does not regain full consciousness, then call EMS. Most patients will remain confused for about 45 to 90 minutes after a seizure, so you must use judgment in calling  for help. - Avoid restraints but make sure the patient is in a bed with padded side rails - Place the individual in a lateral position with the neck slightly flexed; this will help the saliva drain from the mouth and prevent the tongue from falling backward - Remove all nearby furniture and other hazards from the area - Provide verbal assurance as the individual is regaining consciousness - Provide the patient with privacy if possible - Call for help and start treatment as ordered by the caregiver    After the Seizure (Postictal Stage)   After a seizure, most patients experience confusion, fatigue, muscle pain and/or a headache. Thus, one should permit the individual to sleep. For the next few days, reassurance is essential. Being calm and helping reorient the person is also of importance.   Most seizures are painless and end spontaneously. Seizures are not harmful to others but can lead to complications such as stress on the lungs, brain and the heart. Individuals with prior lung problems may develop labored breathing and respiratory distress.

## 2019-10-05 NOTE — Progress Notes (Addendum)
RN called by EMU monitor that patient could be having an event.  RN arrived at 21:29 and patient was moving head from side to side and had more upper body shaking with left shoulder bouncing off the bed.  Patient was unresponsive to me during event.  Patient did not respond to name calling, sternal rub, or painful nipple stimuli.  After RN being in room for approximately 1 min pt started thrashing around in the bed in a rhythmic full body shake.  Pt was thrashing so much RN was providing safety measures to keep head from hitting railings and keeping all extremities safe inside of the bed.  Patient never lost airway and oxygen stats remained good throughout event.  Patient was very diaphoretic.  The body shaking lasted from RN entering room at 21:26 until 21:29; RN called for ativan but did not administer it to patient.  Spoke with Dr. Hortense Ramal who called while monitoring event from another location.  Patient was still unresponsive at that time but RN continued to monitor patient for safety.  Patient finally became response at 21:36 and when RN asked pt if he knew what just happened and pt responded no.  RN asked pt how he felt, pt stated, "I felt hot all over"; RN asked pt if he was in pain and he said yes, "my head, neck, and anterior chest."  Pt. rated it a 2 for soreness.  Pt also reported no pain in right LE (baseline) and tingling in his Left LE.  Pt unable to recall event or color phrase at this time.  RN asked pt where he was and pt stated, "at my wife's house."  Patient became  A X O 4 by the end of the exam around 21:45.

## 2019-10-05 NOTE — Care Plan (Signed)
Nurse stated patient is reporting mild heartburn and nausea.  Therefore ordered IV Zofran and Tums prior to discharge.  Samson Ralph Barbra Sarks

## 2019-10-05 NOTE — Procedures (Addendum)
Patient Name: Dale Bender  MRN: [id number]  Epilepsy Attending: Lora Havens  Referring Physician/Provider: Dr. Zeb Comfort Duration: 10/04/2019 [id number] to 10/05/2019 1121  Patient history: 49 year old male presented with history of generalized tonic-clonic seizure-like episodes.  EEG for characterization of spells.  Level of alertness: Awake, sleep  AEDs during EEG study: Lamotrigine  Technical aspects: This EEG study was done with scalp electrodes positioned according to the 10-20 International system of electrode placement. Electrical activity was acquired at a sampling rate of 500Hz  and reviewed with a high frequency filter of 70Hz  and a low frequency filter of 1Hz . EEG data were recorded continuously and digitally stored.   Description: The posterior dominant rhythm consists of 9-10 Hz activity of moderate voltage (25-35 uV) seen predominantly in posterior head regions, symmetric and reactive to eye opening and eye closing.  Sleep was characterized by vertex waves, sleep spindles (12 to 14 Hz), maximal frontocentral, REM sleep. Hyperventilation and photic stimulation were not performed.  Patient had an event on 10/04/2019.  Initially at 2114 patient was noted to have subtle side-to-side head jerking with normal background rhythm on EEG.  Patient's wife woke up and called his name but patient did not respond.  At 2115 he stopped shaking his head from side to side.  At 2117, patient again started shaking his head from side to side which gradually increased in frequency and amplitude.  Patient's wife is laying on recliner next to the patient.  This side-to-side head shaking continue till 2124 when RN walked in.  RN asked patient's name, patient did not respond.  Gradually the side-to-side head shaking became very vigorous and involved whole body subtle shaking which then progressed to whole body nonrhythmic vigorous shaking.  Patient's heart rate at that time was 143.  RN continue to coach  patient and maintain safety precautions.  At 2127, patient started slowing down, normal posterior dominant rhythm was visible during this time.  At 2128 patient stop shaking, opened his eyes and started exhibiting.  Concomitant EEG before, during and after the event did not show any EEG change to suggest seizure.  IMPRESSION: This study is within normal limits. No seizures or epileptiform discharges were seen throughout the recording.  One event was recorded on 10/04/2019 at 2114 as described above without EEG change and was a nonepileptic event.    Corene Resnick Barbra Sarks

## 2019-10-05 NOTE — Progress Notes (Signed)
EMU DC. No skin breakdown.

## 2019-10-05 NOTE — Telephone Encounter (Signed)
Patient was discharged from EMU today

## 2019-10-05 NOTE — Progress Notes (Signed)
Pt requested tums for heartburn. While retrieving tums to give to patient, patient had one episode of vomiting which contained phlegm. Pt stated this was related to his heartburn and that he still waned to take the tums. Pt also requested IV zofran. Both given. Pt finished 1L of NS IV prior to discharge. Tele and IV removed without complication. AVS and discharge instructions reviewed with patient and his wife. Pt discharged from facility via wheelchair with all belongings including personal cpap machine.

## 2019-10-12 DIAGNOSIS — J45909 Unspecified asthma, uncomplicated: Secondary | ICD-10-CM | POA: Insufficient documentation

## 2019-11-17 ENCOUNTER — Other Ambulatory Visit: Payer: Self-pay

## 2019-11-17 ENCOUNTER — Encounter: Payer: Self-pay | Admitting: Adult Health

## 2019-11-17 ENCOUNTER — Ambulatory Visit: Payer: Medicaid Other | Admitting: Adult Health

## 2019-11-17 VITALS — BP 136/88 | HR 84 | Temp 97.8°F | Ht 73.0 in | Wt 255.0 lb

## 2019-11-17 DIAGNOSIS — G4733 Obstructive sleep apnea (adult) (pediatric): Secondary | ICD-10-CM | POA: Diagnosis not present

## 2019-11-17 DIAGNOSIS — G40909 Epilepsy, unspecified, not intractable, without status epilepticus: Secondary | ICD-10-CM

## 2019-11-17 NOTE — Progress Notes (Addendum)
PATIENT: Dale Bender DOB: 09/06/1970  REASON FOR VISIT: follow up HISTORY FROM: patient  HISTORY OF PRESENT ILLNESS: Today 11/17/19:  Mr. Dale Bender is a 49 year old male with a history of obstructive sleep apnea on BiPAP.  He returns today for follow-up.  His download indicates that he uses machine 28 out of 30 days for compliance of 93%.  He uses machine greater than 4 hours 28 days for compliance of 93%.  On average he uses his machine 8 hours and 23 minutes.  His residual AHI is 3.7 on 20/15 centimeters of water with respiratory rate of 16.  He does not have a significant leak.  This data is from February 12 March 13.  We were unable to get any data after March 13.  Patient reports that he has been using the machine consistently.  We will reach out to his DME company.  Reports that he is still tired during the day.  And often has to take naps  The patient was recently admitted to the EMU-no seizures noted.  He continues on Lamictal.  Reports that he has not had any additional seizures since he has been discharged.  HISTORY The patient is unaccompanied today.  He reports that his wife dropped him off.  He is in a wheelchair.  He reports that he cannot walk longer distances.  He uses crutches at home. As you know, Mr. Dale Bender is a 49 year old right-handed gentleman with an underlying reflux disease, urethral stricture, seizure disorder (for which he is followed by Dr. Krista Blue), history of shortness of breath with cardiac work-up underway, and obesity, who reports snoring and excessive daytime somnolence.  I reviewed your office note from 05/13/2019.  He had a cardiac perfusion test on 05/25/2019.  Results were benign.  He is scheduled for an echocardiogram this month.  He had a prior sleep study several years ago, results are not available for my review today.  He reports that he had a diagnosis of severe obstructive sleep apnea.  He had a CPAP machine but stopped it about 5 or 6 years ago and the  machine was given away.  He would be willing to get retested and consider CPAP therapy again.  His bedtime is around 930 and rise time round 6.  He has nocturia about 3 times per average night and reports occasional morning headaches.  Epworth sleepiness score is 22 out of 24 today, fatigue severity score is 52 out of 63.  He used to work for [employer] and is currently not working.  He drinks caffeine in the form of coffee, 2 cups/day on average, typically no soda currently, no alcohol currently, and quit smoking about 20 years ago.  He is trying to lose weight.  He lives with his wife and 2 children, ages 18 and 69.  He has no family history of OSA.   REVIEW OF SYSTEMS: Out of a complete 14 system review of symptoms, the patient complains only of the following symptoms, and all other reviewed systems are negative.  See HPI  ALLERGIES: No Known Allergies  HOME MEDICATIONS: Outpatient Medications Prior to Visit  Medication Sig Dispense Refill  . albuterol (VENTOLIN HFA) 108 (90 Base) MCG/ACT inhaler Inhale 2 puffs into the lungs every 6 (six) hours as needed for wheezing or shortness of breath. 8 g 0  . LamoTRIgine 200 MG TB24 24 hour tablet Take 2 tablets (400 mg total) by mouth at bedtime. 60 tablet 11  . omeprazole (PRILOSEC) 40  MG capsule Take 40 mg by mouth daily.    . sertraline (ZOLOFT) 100 MG tablet Take 1.5 tablets (150 mg total) by mouth daily. 135 tablet 0   No facility-administered medications prior to visit.    PAST MEDICAL HISTORY: Past Medical History:  Diagnosis Date  . GERD (gastroesophageal reflux disease)   . Prostate nodule    benign  . Seizure ([place])   . 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 FRACTURE SURGERY     LT arm, LT leg, Rt leg  . PROSTATE BIOPSY    . URETHRAL STRICTURE DILATATION      FAMILY HISTORY: Family History  Problem Relation Age of Onset  . Hypertension  Mother   . Diabetes Mother   . Other Father        died at age 54 in accident - log fell on him  . Kidney cancer Maternal Aunt   . Prostate cancer Paternal Uncle     SOCIAL HISTORY: Social History   Socioeconomic History  . Marital status: Married    Spouse name: Not on file  . Number of children: 2  . Years of education: 10th grade  . Highest education level: Not on file  Occupational History  . Occupation: tow Geophysical data processor  Tobacco Use  . Smoking status: Former Research scientist (life sciences)  . Smokeless tobacco: Never Used  Substance and Sexual Activity  . Alcohol use: Not Currently  . Drug use: Not Currently  . Sexual activity: Not on file  Other Topics Concern  . Not on file  Social History Narrative   Lives at home with his wife and children.   Right-handed.   2 cups caffeine per day.   Social Determinants of Health   Financial Resource Strain:   . Difficulty of Paying Living Expenses:   Food Insecurity:   . Worried About Charity fundraiser in the Last Year:   . Arboriculturist in the Last Year:   Transportation Needs:   . Film/video editor (Medical):   Marland Kitchen Lack of Transportation (Non-Medical):   Physical Activity:   . Days of Exercise per Week:   . Minutes of Exercise per Session:   Stress:   . Feeling of Stress :   Social Connections:   . Frequency of Communication with Friends and Family:   . Frequency of Social Gatherings with Friends and Family:   . Attends Religious Services:   . Active Member of Clubs or Organizations:   . Attends Archivist Meetings:   Marland Kitchen Marital Status:   Intimate Partner Violence:   . Fear of Current or Ex-Partner:   . Emotionally Abused:   Marland Kitchen Physically Abused:   . Sexually Abused:       PHYSICAL EXAM  Vitals:   11/17/19 0852  BP: 136/88  Pulse: 84  Temp: 97.8 F (36.6 C)  Weight: 255 lb (115.7 kg)  Height: 6\' 1"  (1.854 m)   Body mass index is 33.64 kg/m.  Generalized: Well developed, in no acute distress  Chest:  Lungs clear to auscultation bilaterally  Neurological examination  Mentation: Alert oriented to time, place, history taking. Follows all commands speech and language fluent Cranial nerve II-XII: Extraocular movements were full, visual field were full on confrontational test Head turning and shoulder shrug  were normal and symmetric. Motor: The motor testing reveals 5 over 5 strength of all 4 extremities with the exception of  weakness in the right lower extremity.   Sensory: Sensory testing is intact to soft touch on all 4 extremities. No evidence of extinction is noted.  Gait and station: Gait is normal.    DIAGNOSTIC DATA (LABS, IMAGING, TESTING) - I reviewed patient records, labs, notes, testing and imaging myself where available.  Lab Results  Component Value Date   WBC 5.4 10/04/2019   HGB 13.4 10/04/2019   HCT 40.2 10/04/2019   MCV 85.5 10/04/2019   PLT 177 10/04/2019      Component Value Date/Time   NA 143 10/05/2019 0412   NA 140 06/10/2019 0818   K 4.8 10/05/2019 0412   CL 109 10/05/2019 0412   CO2 27 10/05/2019 0412   GLUCOSE 98 10/05/2019 0412   BUN 11 10/05/2019 0412   BUN 12 06/10/2019 0818   CREATININE 1.38 (H) 10/05/2019 0412   CALCIUM 8.8 (L) 10/05/2019 0412   PROT 6.3 (L) 10/04/2019 1114   PROT 6.6 06/10/2019 0818   ALBUMIN 4.2 10/04/2019 1114   ALBUMIN 4.7 06/10/2019 0818   AST 27 10/04/2019 1114   ALT 31 10/04/2019 1114   ALKPHOS 55 10/04/2019 1114   BILITOT 0.9 10/04/2019 1114   BILITOT 0.6 06/10/2019 0818   GFRNONAA 60 (L) 10/05/2019 0412   GFRAA >60 10/05/2019 0412   Lab Results  Component Value Date   TRIG 187 (H) 08/05/2019   No results found for: HGBA1C Lab Results  Component Value Date   VITAMINB12 351 06/10/2019   Lab Results  Component Value Date   TSH 2.430 06/10/2019      ASSESSMENT AND PLAN 49 y.o. year old male  has a past medical history of GERD (gastroesophageal reflux disease), Prostate nodule, Seizure ([place]), and Urethral  stricture in male. here with:  1. OSA on BiPAP  - BiPAP compliance excellent - Good treatment of AHI  - Encourage patient to use biPAP nightly and > 4 hours each night -We will reach out to his DME company to see if we can get data for the last month. -In the next 2 to 3 months if the patient continues to have daytime sleepiness despite being compliant on BiPAP we may consider medication. - F/U in 1 year or sooner if needed   I spent 20 minutes of face-to-face and non-face-to-face time with patient.  This included previsit chart review, lab review, study review, order entry, electronic health record documentation, patient education.  Ward Givens, MSN, NP-C 11/17/2019, 8:54 AM [facility] [address], [place], [place] [address] [phone]  I reviewed the above note and documentation by the Nurse Practitioner and agree with the history, exam, assessment and plan as outlined above. I was available for consultation. Star Age, MD, PhD [facility])

## 2019-11-17 NOTE — Patient Instructions (Signed)
Continue using CPAP nightly and greater than 4 hours each night °If your symptoms worsen or you develop new symptoms please let us know.  ° °

## 2019-11-24 NOTE — Progress Notes (Signed)
Virtual Visit via Video Note  I connected with Dale Bender on 11/29/19 at  1:40 PM EDT by a video enabled telemedicine application and verified that I am speaking with the correct person using two identifiers.   I discussed the limitations of evaluation and management by telemedicine and the availability of in person appointments. The patient expressed understanding and agreed to proceed.     I discussed the assessment and treatment plan with the patient. The patient was provided an opportunity to ask questions and all were answered. The patient agreed with the plan and demonstrated an understanding of the instructions.   The patient was advised to call back or seek an in-person evaluation if the symptoms worsen or if the condition fails to improve as anticipated.  I provided 12 minutes of non-face-to-face time during this encounter.   [name], MD    [facility] MD/PA/NP OP Progress Note  11/29/2019 2:06 PM Dale Bender  MRN:  [id number]  Chief Complaint:  Chief Complaint    Depression; Follow-up     HPI:  - He was admitted for epilepsy monitoring.  Per EEG in 09/2019 This study is within normal limits. No seizures or epileptiform discharges were seen throughout the recording One event was recorded on 10/04/2019 at 2114 as described above without EEG change and was a nonepileptic event.  This is a follow-up appointment for depression.  He states that he has been doing well.  He was able to get disability due to seizure.  He enjoys going out with his wife.  He went back to church, and attends services in person.  He believes that taking a walk every day has helped him to feel better.  He denies insomnia. He sleeps well with CPAP machine. He denies feeling depressed.  He has good motivation and energy.  He denies SI. He denies anxiety or panic attacks. He has not had any seizure since last December.   Visit Diagnosis:    ICD-10-CM   1. Major depressive disorder with single  episode, in full remission ([place])  F32.5 sertraline (ZOLOFT) 100 MG tablet    Past Psychiatric History: Please see initial evaluation for full details. I have reviewed the history. No updates at this time.     Past Medical History:  Past Medical History:  Diagnosis Date  . GERD (gastroesophageal reflux disease)   . Prostate nodule    benign  . Seizure ([place])   . 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 FRACTURE SURGERY     LT arm, LT leg, Rt leg  . PROSTATE BIOPSY    . URETHRAL STRICTURE DILATATION      Family Psychiatric History: Please see initial evaluation for full details. I have reviewed the history. No updates at this time.     Family History:  Family History  Problem Relation Age of Onset  . Hypertension Mother   . Diabetes Mother   . Other Father        died at age 13 in accident - log fell on him  . Kidney cancer Maternal Aunt   . Prostate cancer Paternal Uncle     Social History:  Social History   Socioeconomic History  . Marital status: Married    Spouse name: Not on file  . Number of children: 2  . Years of education: 10th grade  . Highest education level: Not on file  Occupational History  . Occupation: tow motor  operator  Tobacco Use  . Smoking status: Former Research scientist (life sciences)  . Smokeless tobacco: Never Used  Substance and Sexual Activity  . Alcohol use: Not Currently  . Drug use: Not Currently  . Sexual activity: Not on file  Other Topics Concern  . Not on file  Social History Narrative   Lives at home with his wife and children.   Right-handed.   2 cups caffeine per day.   Social Determinants of Health   Financial Resource Strain:   . Difficulty of Paying Living Expenses:   Food Insecurity:   . Worried About Charity fundraiser in the Last Year:   . Arboriculturist in the Last Year:   Transportation Needs:   . Film/video editor (Medical):   Marland Kitchen Lack of Transportation (Non-Medical):   Physical  Activity:   . Days of Exercise per Week:   . Minutes of Exercise per Session:   Stress:   . Feeling of Stress :   Social Connections:   . Frequency of Communication with Friends and Family:   . Frequency of Social Gatherings with Friends and Family:   . Attends Religious Services:   . Active Member of Clubs or Organizations:   . Attends Archivist Meetings:   Marland Kitchen Marital Status:     Allergies: No Known Allergies  Metabolic Disorder Labs: No results found for: HGBA1C, MPG No results found for: PROLACTIN Lab Results  Component Value Date   TRIG 187 (H) 08/05/2019   Lab Results  Component Value Date   TSH 2.430 06/10/2019   TSH 1.87 06/01/2019    Therapeutic Level Labs: No results found for: LITHIUM No results found for: VALPROATE No components found for:  CBMZ  Current Medications: Current Outpatient Medications  Medication Sig Dispense Refill  . albuterol (VENTOLIN HFA) 108 (90 Base) MCG/ACT inhaler Inhale 2 puffs into the lungs every 6 (six) hours as needed for wheezing or shortness of breath. 8 g 0  . LamoTRIgine 200 MG TB24 24 hour tablet Take 2 tablets (400 mg total) by mouth at bedtime. 60 tablet 11  . omeprazole (PRILOSEC) 40 MG capsule Take 40 mg by mouth daily.    . sertraline (ZOLOFT) 100 MG tablet Take 1.5 tablets (150 mg total) by mouth daily. 135 tablet 0   No current facility-administered medications for this visit.     Musculoskeletal: Strength & Muscle Tone: N/A Gait & Station: N/A Patient leans: N/A  Psychiatric Specialty Exam: Review of Systems  Psychiatric/Behavioral: Negative for agitation, behavioral problems, confusion, decreased concentration, dysphoric mood, hallucinations, self-injury, sleep disturbance and suicidal ideas. The patient is not nervous/anxious and is not hyperactive.   All other systems reviewed and are negative.   There were no vitals taken for this visit.There is no height or weight on file to calculate BMI.   General Appearance: Fairly Groomed  Eye Contact:  Good  Speech:  Clear and Coherent  Volume:  Normal  Mood:  "good"  Affect:  Appropriate, Congruent and reactive  Thought Process:  Coherent  Orientation:  Full (Time, Place, and Person)  Thought Content: Logical   Suicidal Thoughts:  No  Homicidal Thoughts:  No  Memory:  Immediate;   Good  Judgement:  Good  Insight:  Good  Psychomotor Activity:  Normal  Concentration:  Concentration: Good and Attention Span: Good  Recall:  Good  Fund of Knowledge: Good  Language: Good  Akathisia:  No  Handed:  Right  AIMS (if indicated): not done  Assets:  Communication Skills Desire for Improvement  ADL's:  Intact  Cognition: WNL  Sleep:  Good   Screenings:   Assessment and Plan:  Dale Bender is a 49 y.o. year old male with a history of depression, complex partial seizure, who presents for follow up appointment for Major depressive disorder with single episode, in full remission ([place]) - Plan: sertraline (ZOLOFT) 100 MG tablet  # MDD in full remission, single episode without psychotic features He denies significant mood symptoms since the last visit.  Psychosocial stressors include stabilization from seizure episodes, and right leg weakness.  Will continue current dose of sertraline as maintenance therapy for depression.  Discussed with the patient to stay on this medication at least for a few more months to avoid relapse in his mood symptoms (medication was uptitrated to the current dose since 05/2019).  He agrees with the plan.   Plan 1. Continue sertraline 150 mg daily  2. Next appointment: 7/13 at 10:40 for 20 mins, video 3. Reviewed TSH; wnl  The patient demonstrates the following risk factors for suicide: Chronic risk factors for suicide include:psychiatric disorder ofdepression. Acute risk factorsfor suicide include: loss (financial, interpersonal, professional). Protective factorsfor this patient include: positive social  support, responsibility to others (children, family), coping skills and hope for the future. Considering these factors, the overall suicide risk at this point appears to below. Patientisappropriate for outpatient follow up.  [name], MD 11/29/2019, 2:06 PM

## 2019-11-25 ENCOUNTER — Encounter: Payer: Self-pay | Admitting: General Surgery

## 2019-11-25 ENCOUNTER — Ambulatory Visit (INDEPENDENT_AMBULATORY_CARE_PROVIDER_SITE_OTHER): Payer: Medicaid Other | Admitting: General Surgery

## 2019-11-25 ENCOUNTER — Other Ambulatory Visit: Payer: Self-pay

## 2019-11-25 VITALS — BP 127/75 | HR 88 | Temp 98.1°F | Resp 12 | Ht 73.0 in | Wt 260.0 lb

## 2019-11-25 DIAGNOSIS — K429 Umbilical hernia without obstruction or gangrene: Secondary | ICD-10-CM

## 2019-11-25 NOTE — H&P (Signed)
Dale Bender; [id number]; Sep 05, 1970   HPI Patient is a 49 year old black male who was referred to my care by Denny Levy for evaluation and treatment of an umbilical hernia.  Patient states the hernia has been present for some time, but is recently increasing in size and causing discomfort.  Is made worse with straining.  He denies any nausea or vomiting.  He currently has 0 out of 10 abdominal pain.  Patient has a history of seizures, but has not had a seizure since December 2020. Past Medical History:  Diagnosis Date  . GERD (gastroesophageal reflux disease)   . Prostate nodule    benign  . Seizure ([place])   . 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 FRACTURE SURGERY     LT arm, LT leg, Rt leg  . PROSTATE BIOPSY    . URETHRAL STRICTURE DILATATION      Family History  Problem Relation Age of Onset  . Hypertension Mother   . Diabetes Mother   . Other Father        died at age 51 in accident - log fell on him  . Kidney cancer Maternal Aunt   . Prostate cancer Paternal Uncle     Current Outpatient Medications on File Prior to Visit  Medication Sig Dispense Refill  . albuterol (VENTOLIN HFA) 108 (90 Base) MCG/ACT inhaler Inhale 2 puffs into the lungs every 6 (six) hours as needed for wheezing or shortness of breath. 8 g 0  . LamoTRIgine 200 MG TB24 24 hour tablet Take 2 tablets (400 mg total) by mouth at bedtime. 60 tablet 11  . omeprazole (PRILOSEC) 40 MG capsule Take 40 mg by mouth daily.    . sertraline (ZOLOFT) 100 MG tablet Take 1.5 tablets (150 mg total) by mouth daily. 135 tablet 0   No current facility-administered medications on file prior to visit.    No Known Allergies  Social History   Substance and Sexual Activity  Alcohol Use Not Currently    Social History   Tobacco Use  Smoking Status Former Smoker  Smokeless Tobacco Never Used    Review of Systems  Constitutional: Negative.   HENT: Negative.    Eyes: Negative.   Respiratory: Positive for shortness of breath.   Cardiovascular: Negative.   Gastrointestinal: Positive for heartburn.  Genitourinary: Negative.   Musculoskeletal: Positive for back pain.  Skin: Negative.   Neurological: Negative.   Endo/Heme/Allergies: Negative.   Psychiatric/Behavioral: Negative.     Objective   Vitals:   11/25/19 1008  BP: 127/75  Pulse: 88  Resp: 12  Temp: 98.1 F (36.7 C)  SpO2: 98%    Physical Exam Vitals reviewed.  Constitutional:      Appearance: Normal appearance. He is obese. He is not ill-appearing.  HENT:     Head: Normocephalic and atraumatic.  Cardiovascular:     Rate and Rhythm: Normal rate and regular rhythm.     Heart sounds: Normal heart sounds. No murmur. No friction rub. No gallop.   Pulmonary:     Effort: Pulmonary effort is normal. No respiratory distress.     Breath sounds: Normal breath sounds. No stridor. No wheezing, rhonchi or rales.  Abdominal:     General: Bowel sounds are normal. There is no distension.     Palpations: Abdomen is soft. There is no mass.     Tenderness: There is abdominal tenderness. There is no guarding or rebound.  Hernia: A hernia is present.     Comments: Patient has 2 to 3 cm reducible umbilical hernia.  It is tender to touch when trying to reduce it.  Skin:    General: Skin is warm and dry.  Neurological:     Mental Status: He is alert and oriented to person, place, and time.   Primary care notes reviewed  Assessment  Umbilical hernia Plan   Patient is scheduled for an umbilical herniorrhaphy with mesh on 12/03/2019.  The risks and benefits of the procedure including bleeding, infection, mesh use, and the possibility of recurrence of the hernia were fully explained to the patient, who gave informed consent.

## 2019-11-25 NOTE — Progress Notes (Signed)
Dale Bender; [id number]; [birth year]   HPI Patient is a 49 year old black male who was referred to my care by Denny Levy for evaluation and treatment of an umbilical hernia.  Patient states the hernia has been present for some time, but is recently increasing in size and causing discomfort.  Is made worse with straining.  He denies any nausea or vomiting.  He currently has 0 out of 10 abdominal pain.  Patient has a history of seizures, but has not had a seizure since December 2020. Past Medical History:  Diagnosis Date  . GERD (gastroesophageal reflux disease)   . Prostate nodule    benign  . Seizure ([place])   . 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 FRACTURE SURGERY     LT arm, LT leg, Rt leg  . PROSTATE BIOPSY    . URETHRAL STRICTURE DILATATION      Family History  Problem Relation Age of Onset  . Hypertension Mother   . Diabetes Mother   . Other Father        died at age 23 in accident - log fell on him  . Kidney cancer Maternal Aunt   . Prostate cancer Paternal Uncle     Current Outpatient Medications on File Prior to Visit  Medication Sig Dispense Refill  . albuterol (VENTOLIN HFA) 108 (90 Base) MCG/ACT inhaler Inhale 2 puffs into the lungs every 6 (six) hours as needed for wheezing or shortness of breath. 8 g 0  . LamoTRIgine 200 MG TB24 24 hour tablet Take 2 tablets (400 mg total) by mouth at bedtime. 60 tablet 11  . omeprazole (PRILOSEC) 40 MG capsule Take 40 mg by mouth daily.    . sertraline (ZOLOFT) 100 MG tablet Take 1.5 tablets (150 mg total) by mouth daily. 135 tablet 0   No current facility-administered medications on file prior to visit.    No Known Allergies  Social History   Substance and Sexual Activity  Alcohol Use Not Currently    Social History   Tobacco Use  Smoking Status Former Smoker  Smokeless Tobacco Never Used    Review of Systems  Constitutional: Negative.   HENT: Negative.    Eyes: Negative.   Respiratory: Positive for shortness of breath.   Cardiovascular: Negative.   Gastrointestinal: Positive for heartburn.  Genitourinary: Negative.   Musculoskeletal: Positive for back pain.  Skin: Negative.   Neurological: Negative.   Endo/Heme/Allergies: Negative.   Psychiatric/Behavioral: Negative.     Objective   Vitals:   11/25/19 1008  BP: 127/75  Pulse: 88  Resp: 12  Temp: 98.1 F (36.7 C)  SpO2: 98%    Physical Exam Vitals reviewed.  Constitutional:      Appearance: Normal appearance. He is obese. He is not ill-appearing.  HENT:     Head: Normocephalic and atraumatic.  Cardiovascular:     Rate and Rhythm: Normal rate and regular rhythm.     Heart sounds: Normal heart sounds. No murmur. No friction rub. No gallop.   Pulmonary:     Effort: Pulmonary effort is normal. No respiratory distress.     Breath sounds: Normal breath sounds. No stridor. No wheezing, rhonchi or rales.  Abdominal:     General: Bowel sounds are normal. There is no distension.     Palpations: Abdomen is soft. There is no mass.     Tenderness: There is abdominal tenderness. There is no guarding or rebound.  Hernia: A hernia is present.     Comments: Patient has 2 to 3 cm reducible umbilical hernia.  It is tender to touch when trying to reduce it.  Skin:    General: Skin is warm and dry.  Neurological:     Mental Status: He is alert and oriented to person, place, and time.   Primary care notes reviewed  Assessment  Umbilical hernia Plan   Patient is scheduled for an umbilical herniorrhaphy with mesh on 12/03/2019.  The risks and benefits of the procedure including bleeding, infection, mesh use, and the possibility of recurrence of the hernia were fully explained to the patient, who gave informed consent.

## 2019-11-25 NOTE — Patient Instructions (Signed)
Ventral Hernia  A ventral hernia is a bulge of tissue from inside the abdomen that pushes through a weak area of the muscles that form the front wall of the abdomen. The tissues inside the abdomen are inside a sac (peritoneum). These tissues include the small intestine, large intestine, and the fatty tissue that covers the intestines (omentum). Sometimes, the bulge that forms a hernia contains intestines. Other hernias contain only fat. Ventral hernias do not go away without surgical treatment. There are several types of ventral hernias. You may have:  A hernia at an incision site from previous abdominal surgery (incisional hernia).  A hernia just above the belly button (epigastric hernia), or at the belly button (umbilical hernia). These types of hernias can develop from heavy lifting or straining.  A hernia that comes and goes (reducible hernia). It may be visible only when you lift or strain. This type of hernia can be pushed back into the abdomen (reduced).  A hernia that traps abdominal tissue inside the hernia (incarcerated hernia). This type of hernia does not reduce.  A hernia that cuts off blood flow to the tissues inside the hernia (strangulated hernia). The tissues can start to die if this happens. This is a very painful bulge that cannot be reduced. A strangulated hernia is a medical emergency. What are the causes? This condition is caused by abdominal tissue putting pressure on an area of weakness in the abdominal muscles. What increases the risk? The following factors may make you more likely to develop this condition:  Being male.  Being 60 or older.  Being overweight or obese.  Having had previous abdominal surgery, especially if there was an infection after surgery.  Having had an injury to the abdominal wall.  Having had several pregnancies.  Having a buildup of fluid inside the abdomen (ascites). What are the signs or symptoms? The only symptom of a ventral hernia  may be a painless bulge in the abdomen. A reducible hernia may be visible only when you strain, cough, or lift. Other symptoms may include:  Dull pain.  A feeling of pressure. Signs and symptoms of a strangulated hernia may include:  Increasing pain.  Nausea and vomiting.  Pain when pressing on the hernia.  The skin over the hernia turning red or purple.  Constipation.  Blood in the stool (feces). How is this diagnosed? This condition may be diagnosed based on:  Your symptoms.  Your medical history.  A physical exam. You may be asked to cough or strain while standing. These actions increase the pressure inside your abdomen and force the hernia through the opening in your muscles. Your health care provider may try to reduce the hernia by pressing on it.  Imaging studies, such as an ultrasound or CT scan. How is this treated? This condition is treated with surgery. If you have a strangulated hernia, surgery is done as soon as possible. If your hernia is small and not incarcerated, you may be asked to lose some weight before surgery. Follow these instructions at home:  Follow instructions from your health care provider about eating or drinking restrictions.  If you are overweight, your health care provider may recommend that you increase your activity level and eat a healthier diet.  Do not lift anything that is heavier than 10 lb (4.5 kg).  Return to your normal activities as told by your health care provider. Ask your health care provider what activities are safe for you. You may need to avoid activities   that increase pressure on your hernia.  Take over-the-counter and prescription medicines only as told by your health care provider.  Keep all follow-up visits as told by your health care provider. This is important. Contact a health care provider if:  Your hernia gets larger.  Your hernia becomes painful. Get help right away if:  Your hernia becomes increasingly  painful.  You have pain along with any of the following: ? Changes in skin color in the area of the hernia. ? Nausea. ? Vomiting. ? Fever. Summary  A ventral hernia is a bulge of tissue from inside the abdomen that pushes through a weak area of the muscles that form the front wall of the abdomen.  This condition is treated with surgery, which may be urgent depending on your hernia.  Do not lift anything that is heavier than 10 lb (4.5 kg), and follow activity instructions from your health care provider. This information is not intended to replace advice given to you by your health care provider. Make sure you discuss any questions you have with your health care provider. Document Revised: 09/10/2017 Document Reviewed: 02/17/2017 Elsevier Patient Education  2020 [employer]  

## 2019-11-29 ENCOUNTER — Encounter (HOSPITAL_COMMUNITY): Payer: Self-pay | Admitting: Psychiatry

## 2019-11-29 ENCOUNTER — Ambulatory Visit (INDEPENDENT_AMBULATORY_CARE_PROVIDER_SITE_OTHER): Payer: Medicaid Other | Admitting: Psychiatry

## 2019-11-29 DIAGNOSIS — F325 Major depressive disorder, single episode, in full remission: Secondary | ICD-10-CM | POA: Diagnosis not present

## 2019-11-29 MED ORDER — SERTRALINE HCL 100 MG PO TABS: 150.0000 mg | ORAL_TABLET | Freq: Every day | ORAL | 0 refills | Status: DC

## 2019-11-29 NOTE — Patient Instructions (Signed)
1.Continuesertraline 150 mg daily  2.Next appointment: 7/13 at 10:40

## 2019-11-30 ENCOUNTER — Other Ambulatory Visit: Payer: Self-pay

## 2019-11-30 ENCOUNTER — Other Ambulatory Visit (HOSPITAL_COMMUNITY)
Admission: RE | Admit: 2019-11-30 | Discharge: 2019-11-30 | Disposition: A | Discharge status: Home / Self Care | Payer: Medicaid Other | Source: Ambulatory Visit | Attending: General Surgery | Admitting: General Surgery

## 2019-11-30 DIAGNOSIS — Z20822 Contact with and (suspected) exposure to covid-19: Secondary | ICD-10-CM | POA: Insufficient documentation

## 2019-11-30 DIAGNOSIS — Z01812 Encounter for preprocedural laboratory examination: Secondary | ICD-10-CM | POA: Insufficient documentation

## 2019-12-01 ENCOUNTER — Other Ambulatory Visit: Payer: Self-pay

## 2019-12-01 ENCOUNTER — Encounter (HOSPITAL_COMMUNITY): Payer: Self-pay

## 2019-12-01 ENCOUNTER — Encounter (HOSPITAL_COMMUNITY)
Admission: RE | Admit: 2019-12-01 | Discharge: 2019-12-01 | Disposition: A | Discharge status: Home / Self Care | Payer: Medicaid Other | Source: Ambulatory Visit | Attending: Internal Medicine | Admitting: Internal Medicine

## 2019-12-01 DIAGNOSIS — Z01818 Encounter for other preprocedural examination: Secondary | ICD-10-CM | POA: Diagnosis present

## 2019-12-01 HISTORY — DX: Sleep apnea, unspecified: G47.30

## 2019-12-01 HISTORY — DX: Unspecified osteoarthritis, unspecified site: M19.90

## 2019-12-01 HISTORY — DX: Anxiety disorder, unspecified: F41.9

## 2019-12-01 LAB — SARS CORONAVIRUS 2 (TAT 6-24 HRS): SARS Coronavirus 2: NEGATIVE

## 2019-12-01 NOTE — Patient Instructions (Signed)
Dale Bender  12/01/2019     @PREFPERIOPPHARMACY @   Your procedure is scheduled on  12/03/2019 .  Report to [facility] at  [phone]  A.M.  Call this number if you have problems the morning of surgery:  [phone]   Remember:  Do not eat or drink after midnight.                        Take these medicines the morning of surgery with A SIP OF WATER  Prilosec, zoloft.    Do not wear jewelry, make-up or nail polish.  Do not wear lotions, powders, or perfume. Please wear deodorant and brush your teeth.  Do not shave 48 hours prior to surgery.  Men may shave face and neck.  Do not bring valuables to the hospital.  [facility] is not responsible for any belongings or valuables.  Contacts, dentures or bridgework may not be worn into surgery.  Leave your suitcase in the car.  After surgery it may be brought to your room.  For patients admitted to the hospital, discharge time will be determined by your treatment team.  Patients discharged the day of surgery will not be allowed to drive home.   Name and phone number of your driver:   family Special instructions:  DO NOT smoke the morning of your procedure.  Please read over the following fact sheets that you were given. Anesthesia Post-op Instructions and Care and Recovery After Surgery.       Open Hernia Repair, Adult, Care After These instructions give you information about caring for yourself after your procedure. Your doctor may also give you more specific instructions. If you have problems or questions, contact your doctor. Follow these instructions at home: Surgical cut (incision) care   Follow instructions from your doctor about how to take care of your surgical cut area. Make sure you: ? Wash your hands with soap and water before you change your bandage (dressing). If you cannot use soap and water, use hand sanitizer. ? Change your bandage as told by your doctor. ? Leave stitches (sutures), skin glue, or skin  tape (adhesive) strips in place. They may need to stay in place for 2 weeks or longer. If tape strips get loose and curl up, you may trim the loose edges. Do not remove tape strips completely unless your doctor says it is okay.  Check your surgical cut every day for signs of infection. Check for: ? More redness, swelling, or pain. ? More fluid or blood. ? Warmth. ? Pus or a bad smell. Activity  Do not drive or use heavy machinery while taking prescription pain medicine. Do not drive until your doctor says it is okay.  Until your doctor says it is okay: ? Do not lift anything that is heavier than 10 lb (4.5 kg). ? Do not play contact sports.  Return to your normal activities as told by your doctor. Ask your doctor what activities are safe. General instructions  To prevent or treat having a hard time pooping (constipation) while you are taking prescription pain medicine, your doctor may recommend that you: ? Drink enough fluid to keep your pee (urine) clear or pale yellow. ? Take over-the-counter or prescription medicines. ? Eat foods that are high in fiber, such as fresh fruits and vegetables, whole grains, and beans. ? Limit foods that are high in fat and processed sugars, such as fried and sweet foods.  Take over-the-counter and prescription medicines only as told by your doctor.  Do not take baths, swim, or use a hot tub until your doctor says it is okay.  Keep all follow-up visits as told by your doctor. This is important. Contact a doctor if:  You develop a rash.  You have more redness, swelling, or pain around your surgical cut.  You have more fluid or blood coming from your surgical cut.  Your surgical cut feels warm to the touch.  You have pus or a bad smell coming from your surgical cut.  You have a fever or chills.  You have blood in your poop (stool).  You have not pooped in 2-3 days.  Medicine does not help your pain. Get help right away if:  You have  chest pain or you are short of breath.  You feel light-headed.  You feel weak and dizzy (feel faint).  You have very bad pain.  You throw up (vomit) and your pain is worse. This information is not intended to replace advice given to you by your health care provider. Make sure you discuss any questions you have with your health care provider. Document Revised: 11/20/2018 Document Reviewed: 01/10/2016 Elsevier Patient Education  2020 [place] Anesthesia, Adult, Care After This sheet gives you information about how to care for yourself after your procedure. Your health care provider may also give you more specific instructions. If you have problems or questions, contact your health care provider. What can I expect after the procedure? After the procedure, the following side effects are common:  Pain or discomfort at the IV site.  Nausea.  Vomiting.  Sore throat.  Trouble concentrating.  Feeling cold or chills.  Weak or tired.  Sleepiness and fatigue.  Soreness and body aches. These side effects can affect parts of the body that were not involved in surgery. Follow these instructions at home:  For at least 24 hours after the procedure:  Have a responsible adult stay with you. It is important to have someone help care for you until you are awake and alert.  Rest as needed.  Do not: ? Participate in activities in which you could fall or become injured. ? Drive. ? Use heavy machinery. ? Drink alcohol. ? Take sleeping pills or medicines that cause drowsiness. ? Make important decisions or sign legal documents. ? Take care of children on your own. Eating and drinking  Follow any instructions from your health care provider about eating or drinking restrictions.  When you feel hungry, start by eating small amounts of foods that are soft and easy to digest (bland), such as toast. Gradually return to your regular diet.  Drink enough fluid to keep your urine  pale yellow.  If you vomit, rehydrate by drinking water, juice, or clear broth. General instructions  If you have sleep apnea, surgery and certain medicines can increase your risk for breathing problems. Follow instructions from your health care provider about wearing your sleep device: ? Anytime you are sleeping, including during daytime naps. ? While taking prescription pain medicines, sleeping medicines, or medicines that make you drowsy.  Return to your normal activities as told by your health care provider. Ask your health care provider what activities are safe for you.  Take over-the-counter and prescription medicines only as told by your health care provider.  If you smoke, do not smoke without supervision.  Keep all follow-up visits as told by your health care provider. This is important. Contact  a health care provider if:  You have nausea or vomiting that does not get better with medicine.  You cannot eat or drink without vomiting.  You have pain that does not get better with medicine.  You are unable to pass urine.  You develop a skin rash.  You have a fever.  You have redness around your IV site that gets worse. Get help right away if:  You have difficulty breathing.  You have chest pain.  You have blood in your urine or stool, or you vomit blood. Summary  After the procedure, it is common to have a sore throat or nausea. It is also common to feel tired.  Have a responsible adult stay with you for the first 24 hours after general anesthesia. It is important to have someone help care for you until you are awake and alert.  When you feel hungry, start by eating small amounts of foods that are soft and easy to digest (bland), such as toast. Gradually return to your regular diet.  Drink enough fluid to keep your urine pale yellow.  Return to your normal activities as told by your health care provider. Ask your health care provider what activities are safe for  you. This information is not intended to replace advice given to you by your health care provider. Make sure you discuss any questions you have with your health care provider. Document Revised: 08/01/2017 Document Reviewed: 03/14/2017 Elsevier Patient Education  [place]. How to Use Chlorhexidine for Bathing Chlorhexidine gluconate (CHG) is a germ-killing (antiseptic) solution that is used to clean the skin. It can get rid of the bacteria that normally live on the skin and can keep them away for about 24 hours. To clean your skin with CHG, you may be given:  A CHG solution to use in the shower or as part of a sponge bath.  A prepackaged cloth that contains CHG. Cleaning your skin with CHG may help lower the risk for infection:  While you are staying in the intensive care unit of the hospital.  If you have a vascular access, such as a central line, to provide short-term or long-term access to your veins.  If you have a catheter to drain urine from your bladder.  If you are on a ventilator. A ventilator is a machine that helps you breathe by moving air in and out of your lungs.  After surgery. What are the risks? Risks of using CHG include:  A skin reaction.  Hearing loss, if CHG gets in your ears.  Eye injury, if CHG gets in your eyes and is not rinsed out.  The CHG product catching fire. Make sure that you avoid smoking and flames after applying CHG to your skin. Do not use CHG:  If you have a chlorhexidine allergy or have previously reacted to chlorhexidine.  On babies younger than 26 months of age. How to use CHG solution  Use CHG only as told by your health care provider, and follow the instructions on the label.  Use the full amount of CHG as directed. Usually, this is one bottle. During a shower Follow these steps when using CHG solution during a shower (unless your health care provider gives you different instructions): 1. Start the shower. 2. Use your  normal soap and shampoo to wash your face and hair. 3. Turn off the shower or move out of the shower stream. 4. Pour the CHG onto a clean washcloth. Do not use any type  of brush or rough-edged sponge. 5. Starting at your neck, lather your body down to your toes. Make sure you follow these instructions: ? If you will be having surgery, pay special attention to the part of your body where you will be having surgery. Scrub this area for at least 1 minute. ? Do not use CHG on your head or face. If the solution gets into your ears or eyes, rinse them well with water. ? Avoid your genital area. ? Avoid any areas of skin that have broken skin, cuts, or scrapes. ? Scrub your back and under your arms. Make sure to wash skin folds. 6. Let the lather sit on your skin for 1-2 minutes or as long as told by your health care provider. 7. Thoroughly rinse your entire body in the shower. Make sure that all body creases and crevices are rinsed well. 8. Dry off with a clean towel. Do not put any substances on your body afterward-such as powder, lotion, or perfume-unless you are told to do so by your health care provider. Only use lotions that are recommended by the manufacturer. 9. Put on clean clothes or pajamas. 10. If it is the night before your surgery, sleep in clean sheets.  During a sponge bath Follow these steps when using CHG solution during a sponge bath (unless your health care provider gives you different instructions): 1. Use your normal soap and shampoo to wash your face and hair. 2. Pour the CHG onto a clean washcloth. 3. Starting at your neck, lather your body down to your toes. Make sure you follow these instructions: ? If you will be having surgery, pay special attention to the part of your body where you will be having surgery. Scrub this area for at least 1 minute. ? Do not use CHG on your head or face. If the solution gets into your ears or eyes, rinse them well with water. ? Avoid your  genital area. ? Avoid any areas of skin that have broken skin, cuts, or scrapes. ? Scrub your back and under your arms. Make sure to wash skin folds. 4. Let the lather sit on your skin for 1-2 minutes or as long as told by your health care provider. 5. Using a different clean, wet washcloth, thoroughly rinse your entire body. Make sure that all body creases and crevices are rinsed well. 6. Dry off with a clean towel. Do not put any substances on your body afterward-such as powder, lotion, or perfume-unless you are told to do so by your health care provider. Only use lotions that are recommended by the manufacturer. 7. Put on clean clothes or pajamas. 8. If it is the night before your surgery, sleep in clean sheets. How to use CHG prepackaged cloths  Only use CHG cloths as told by your health care provider, and follow the instructions on the label.  Use the CHG cloth on clean, dry skin.  Do not use the CHG cloth on your head or face unless your health care provider tells you to.  When washing with the CHG cloth: ? Avoid your genital area. ? Avoid any areas of skin that have broken skin, cuts, or scrapes. Before surgery Follow these steps when using a CHG cloth to clean before surgery (unless your health care provider gives you different instructions): 1. Using the CHG cloth, vigorously scrub the part of your body where you will be having surgery. Scrub using a back-and-forth motion for 3 minutes. The area on your body  should be completely wet with CHG when you are done scrubbing. 2. Do not rinse. Discard the cloth and let the area air-dry. Do not put any substances on the area afterward, such as powder, lotion, or perfume. 3. Put on clean clothes or pajamas. 4. If it is the night before your surgery, sleep in clean sheets.  For general bathing Follow these steps when using CHG cloths for general bathing (unless your health care provider gives you different instructions). 1. Use a separate  CHG cloth for each area of your body. Make sure you wash between any folds of skin and between your fingers and toes. Wash your body in the following order, switching to a new cloth after each step: ? The front of your neck, shoulders, and chest. ? Both of your arms, under your arms, and your hands. ? Your stomach and groin area, avoiding the genitals. ? Your right leg and foot. ? Your left leg and foot. ? The back of your neck, your back, and your buttocks. 2. Do not rinse. Discard the cloth and let the area air-dry. Do not put any substances on your body afterward-such as powder, lotion, or perfume-unless you are told to do so by your health care provider. Only use lotions that are recommended by the manufacturer. 3. Put on clean clothes or pajamas. Contact a health care provider if:  Your skin gets irritated after scrubbing.  You have questions about using your solution or cloth. Get help right away if:  Your eyes become very red or swollen.  Your eyes itch badly.  Your skin itches badly and is red or swollen.  Your hearing changes.  You have trouble seeing.  You have swelling or tingling in your mouth or throat.  You have trouble breathing.  You swallow any chlorhexidine. Summary  Chlorhexidine gluconate (CHG) is a germ-killing (antiseptic) solution that is used to clean the skin. Cleaning your skin with CHG may help to lower your risk for infection.  You may be given CHG to use for bathing. It may be in a bottle or in a prepackaged cloth to use on your skin. Carefully follow your health care provider's instructions and the instructions on the product label.  Do not use CHG if you have a chlorhexidine allergy.  Contact your health care provider if your skin gets irritated after scrubbing. This information is not intended to replace advice given to you by your health care provider. Make sure you discuss any questions you have with your health care provider. Document  Revised: 10/15/2018 Document Reviewed: 06/26/2017 Elsevier Patient Education  [place].

## 2019-12-03 ENCOUNTER — Observation Stay (HOSPITAL_COMMUNITY)
Admission: RE | Admit: 2019-12-03 | Discharge: 2019-12-03 | Disposition: A | Discharge status: Home / Self Care | Payer: Medicaid Other | Source: Home / Self Care | Attending: Family Medicine | Admitting: Family Medicine

## 2019-12-03 ENCOUNTER — Encounter (HOSPITAL_COMMUNITY)
Admission: RE | Disposition: A | Discharge status: Home / Self Care | Payer: Self-pay | Source: Home / Self Care | Attending: Family Medicine

## 2019-12-03 ENCOUNTER — Observation Stay (HOSPITAL_COMMUNITY)
Admission: RE | Admit: 2019-12-03 | Discharge: 2019-12-04 | Disposition: A | Discharge status: Home / Self Care | Payer: Medicaid Other | Attending: General Surgery | Admitting: General Surgery

## 2019-12-03 ENCOUNTER — Encounter (HOSPITAL_COMMUNITY): Payer: Self-pay | Admitting: General Surgery

## 2019-12-03 ENCOUNTER — Ambulatory Visit (HOSPITAL_COMMUNITY): Payer: Medicaid Other | Admitting: Anesthesiology

## 2019-12-03 DIAGNOSIS — G4733 Obstructive sleep apnea (adult) (pediatric): Secondary | ICD-10-CM | POA: Diagnosis not present

## 2019-12-03 DIAGNOSIS — R569 Unspecified convulsions: Secondary | ICD-10-CM

## 2019-12-03 DIAGNOSIS — Z87891 Personal history of nicotine dependence: Secondary | ICD-10-CM | POA: Diagnosis not present

## 2019-12-03 DIAGNOSIS — K429 Umbilical hernia without obstruction or gangrene: Principal | ICD-10-CM

## 2019-12-03 DIAGNOSIS — K219 Gastro-esophageal reflux disease without esophagitis: Secondary | ICD-10-CM | POA: Insufficient documentation

## 2019-12-03 DIAGNOSIS — E669 Obesity, unspecified: Secondary | ICD-10-CM | POA: Diagnosis not present

## 2019-12-03 DIAGNOSIS — Z79899 Other long term (current) drug therapy: Secondary | ICD-10-CM | POA: Insufficient documentation

## 2019-12-03 HISTORY — PX: UMBILICAL HERNIA REPAIR: SHX196

## 2019-12-03 LAB — BLOOD GAS, ARTERIAL
Acid-Base Excess: 3.5 mmol/L — ABNORMAL HIGH (ref 0.0–2.0)
Bicarbonate: 26.3 mmol/L (ref 20.0–28.0)
FIO2: 100
O2 Saturation: 99.4 %
Patient temperature: 36.7
pCO2 arterial: 55.2 mmHg — ABNORMAL HIGH (ref 32.0–48.0)
pH, Arterial: 7.338 — ABNORMAL LOW (ref 7.350–7.450)
pO2, Arterial: 367 mmHg — ABNORMAL HIGH (ref 83.0–108.0)

## 2019-12-03 LAB — GLUCOSE, CAPILLARY
Glucose-Capillary: 99 mg/dL (ref 70–99)
Glucose-Capillary: 94 mg/dL (ref 70–99)

## 2019-12-03 SURGERY — REPAIR, HERNIA, UMBILICAL, ADULT
Anesthesia: General | Site: Abdomen

## 2019-12-03 MED ORDER — FENTANYL CITRATE (PF) 100 MCG/2ML IJ SOLN
INTRAMUSCULAR | Status: AC
  Filled 2019-12-03: qty 2

## 2019-12-03 MED ORDER — ONDANSETRON HCL 4 MG/2ML IJ SOLN
INTRAMUSCULAR | Status: AC
  Filled 2019-12-03: qty 4

## 2019-12-03 MED ORDER — MIDAZOLAM HCL 2 MG/2ML IJ SOLN
1.0000 mg | Freq: Once | INTRAMUSCULAR | Status: AC
  Administered 2019-12-03: 1 mg via INTRAVENOUS

## 2019-12-03 MED ORDER — PROPOFOL 10 MG/ML IV BOLUS
INTRAVENOUS | Status: AC
  Filled 2019-12-03: qty 40

## 2019-12-03 MED ORDER — EPHEDRINE 5 MG/ML INJ
INTRAVENOUS | Status: AC
  Filled 2019-12-03: qty 10

## 2019-12-03 MED ORDER — ACETAMINOPHEN 325 MG PO TABS: 650.0000 mg | ORAL_TABLET | Freq: Four times a day (QID) | ORAL | Status: DC | PRN

## 2019-12-03 MED ORDER — BUPIVACAINE LIPOSOME 1.3 % IJ SUSP
INTRAMUSCULAR | Status: DC | PRN
  Administered 2019-12-03: 20 mL

## 2019-12-03 MED ORDER — LAMOTRIGINE 100 MG PO TABS
400.0000 mg | ORAL_TABLET | Freq: Every day | ORAL | Status: DC
  Administered 2019-12-03: 22:00:00 400 mg via ORAL
  Filled 2019-12-03: qty 4

## 2019-12-03 MED ORDER — FENTANYL CITRATE (PF) 100 MCG/2ML IJ SOLN
25.0000 ug | INTRAMUSCULAR | Status: DC | PRN
  Administered 2019-12-03: 25 ug via INTRAVENOUS

## 2019-12-03 MED ORDER — CEFAZOLIN SODIUM-DEXTROSE 2-4 GM/100ML-% IV SOLN
INTRAVENOUS | Status: AC
  Filled 2019-12-03: qty 100

## 2019-12-03 MED ORDER — BUPIVACAINE LIPOSOME 1.3 % IJ SUSP
INTRAMUSCULAR | Status: AC
  Filled 2019-12-03: qty 20

## 2019-12-03 MED ORDER — KETOROLAC TROMETHAMINE 30 MG/ML IJ SOLN
30.0000 mg | Freq: Four times a day (QID) | INTRAMUSCULAR | Status: AC
  Administered 2019-12-03: 16:00:00 30 mg via INTRAVENOUS
  Filled 2019-12-03: qty 1

## 2019-12-03 MED ORDER — LIDOCAINE 2% (20 MG/ML) 5 ML SYRINGE
INTRAMUSCULAR | Status: AC
  Filled 2019-12-03: qty 10

## 2019-12-03 MED ORDER — CEFAZOLIN SODIUM-DEXTROSE 2-4 GM/100ML-% IV SOLN
2.0000 g | INTRAVENOUS | Status: AC
  Administered 2019-12-03: 2 g via INTRAVENOUS

## 2019-12-03 MED ORDER — ALBUTEROL SULFATE HFA 108 (90 BASE) MCG/ACT IN AERS
2.0000 | INHALATION_SPRAY | Freq: Four times a day (QID) | RESPIRATORY_TRACT | Status: DC | PRN
  Administered 2019-12-04: 08:00:00 2 via RESPIRATORY_TRACT
  Filled 2019-12-03: qty 6.7

## 2019-12-03 MED ORDER — ONDANSETRON 4 MG PO TBDP: 4.0000 mg | ORAL_TABLET | Freq: Four times a day (QID) | ORAL | Status: DC | PRN

## 2019-12-03 MED ORDER — SERTRALINE HCL 50 MG PO TABS
150.0000 mg | ORAL_TABLET | Freq: Every day | ORAL | Status: DC
  Administered 2019-12-04: 11:00:00 150 mg via ORAL
  Filled 2019-12-03: qty 3

## 2019-12-03 MED ORDER — SODIUM CHLORIDE 0.9 % IV SOLN: INTRAVENOUS | Status: DC

## 2019-12-03 MED ORDER — PANTOPRAZOLE SODIUM 40 MG PO TBEC
40.0000 mg | DELAYED_RELEASE_TABLET | Freq: Every day | ORAL | Status: DC
  Administered 2019-12-04: 11:00:00 40 mg via ORAL
  Filled 2019-12-03: qty 1

## 2019-12-03 MED ORDER — MIDAZOLAM HCL 2 MG/2ML IJ SOLN
INTRAMUSCULAR | Status: AC
  Filled 2019-12-03: qty 2

## 2019-12-03 MED ORDER — CHLORHEXIDINE GLUCONATE CLOTH 2 % EX PADS: 6.0000 | MEDICATED_PAD | Freq: Once | CUTANEOUS | Status: DC

## 2019-12-03 MED ORDER — PROPOFOL 10 MG/ML IV BOLUS
INTRAVENOUS | Status: DC | PRN
  Administered 2019-12-03: 30 mg via INTRAVENOUS
  Administered 2019-12-03: 300 mg via INTRAVENOUS

## 2019-12-03 MED ORDER — SODIUM CHLORIDE 0.9 % IR SOLN
Status: DC | PRN
  Administered 2019-12-03: 1000 mL

## 2019-12-03 MED ORDER — PHENYLEPHRINE 40 MCG/ML (10ML) SYRINGE FOR IV PUSH (FOR BLOOD PRESSURE SUPPORT)
PREFILLED_SYRINGE | INTRAVENOUS | Status: AC
  Filled 2019-12-03: qty 10

## 2019-12-03 MED ORDER — HYDROMORPHONE HCL 1 MG/ML IJ SOLN
1.0000 mg | INTRAMUSCULAR | Status: DC | PRN
  Administered 2019-12-04: 1 mg via INTRAVENOUS
  Filled 2019-12-03: qty 1

## 2019-12-03 MED ORDER — LACTATED RINGERS IV SOLN
INTRAVENOUS | Status: DC
  Administered 2019-12-03: 07:00:00 1000 mL via INTRAVENOUS

## 2019-12-03 MED ORDER — FENTANYL CITRATE (PF) 100 MCG/2ML IJ SOLN
INTRAMUSCULAR | Status: DC | PRN
  Administered 2019-12-03 (×2): 25 ug via INTRAVENOUS
  Administered 2019-12-03: 100 ug via INTRAVENOUS

## 2019-12-03 MED ORDER — ACETAMINOPHEN 650 MG RE SUPP: 650.0000 mg | Freq: Four times a day (QID) | RECTAL | Status: DC | PRN

## 2019-12-03 MED ORDER — KETOROLAC TROMETHAMINE 30 MG/ML IJ SOLN
30.0000 mg | Freq: Once | INTRAMUSCULAR | Status: AC
  Administered 2019-12-03: 30 mg via INTRAVENOUS
  Filled 2019-12-03: qty 1

## 2019-12-03 MED ORDER — ONDANSETRON HCL 4 MG/2ML IJ SOLN
INTRAMUSCULAR | Status: DC | PRN
  Administered 2019-12-03: 4 mg via INTRAVENOUS

## 2019-12-03 MED ORDER — KETOROLAC TROMETHAMINE 30 MG/ML IJ SOLN: 30.0000 mg | Freq: Four times a day (QID) | INTRAMUSCULAR | Status: DC | PRN

## 2019-12-03 MED ORDER — ONDANSETRON HCL 4 MG/2ML IJ SOLN: 4.0000 mg | Freq: Four times a day (QID) | INTRAMUSCULAR | Status: DC | PRN

## 2019-12-03 MED ORDER — ENOXAPARIN SODIUM 40 MG/0.4ML ~~LOC~~ SOLN: 40.0000 mg | SUBCUTANEOUS | Status: DC

## 2019-12-03 MED ORDER — LORAZEPAM 2 MG/ML IJ SOLN: 1.0000 mg | INTRAMUSCULAR | Status: DC | PRN

## 2019-12-03 MED ORDER — OXYCODONE HCL 5 MG PO TABS
5.0000 mg | ORAL_TABLET | ORAL | Status: DC | PRN
  Administered 2019-12-03 – 2019-12-04 (×2): 10 mg via ORAL
  Filled 2019-12-03 (×2): qty 2

## 2019-12-03 MED ORDER — OXYCODONE-ACETAMINOPHEN 7.5-325 MG PO TABS: 1.0000 | ORAL_TABLET | Freq: Four times a day (QID) | ORAL | 0 refills | Status: DC | PRN

## 2019-12-03 MED ORDER — CHLORHEXIDINE GLUCONATE CLOTH 2 % EX PADS
6.0000 | MEDICATED_PAD | Freq: Every day | CUTANEOUS | Status: DC
  Administered 2019-12-03: 6 via TOPICAL

## 2019-12-03 MED ORDER — ROCURONIUM BROMIDE 10 MG/ML (PF) SYRINGE
PREFILLED_SYRINGE | INTRAVENOUS | Status: AC
  Filled 2019-12-03: qty 10

## 2019-12-03 SURGICAL SUPPLY — 31 items
BLADE SURG SZ11 CARB STEEL (BLADE) ×2 IMPLANT
CHLORAPREP W/TINT 26 (MISCELLANEOUS) ×2 IMPLANT
CLOTH BEACON ORANGE TIMEOUT ST (SAFETY) ×2 IMPLANT
COVER LIGHT HANDLE STERIS (MISCELLANEOUS) ×4 IMPLANT
COVER WAND RF STERILE (DRAPES) ×2 IMPLANT
DERMABOND ADVANCED (GAUZE/BANDAGES/DRESSINGS) ×1
DERMABOND ADVANCED .7 DNX12 (GAUZE/BANDAGES/DRESSINGS) ×1 IMPLANT
ELECT REM PT RETURN 9FT ADLT (ELECTROSURGICAL) ×2
ELECTRODE REM PT RTRN 9FT ADLT (ELECTROSURGICAL) ×1 IMPLANT
GLOVE BIO SURGEON STRL SZ7 (GLOVE) ×2 IMPLANT
GLOVE BIOGEL PI IND STRL 7.0 (GLOVE) ×2 IMPLANT
GLOVE BIOGEL PI INDICATOR 7.0 (GLOVE) ×3
GLOVE SURG SS PI 7.5 STRL IVOR (GLOVE) ×2 IMPLANT
GOWN STRL REUS W/TWL LRG LVL3 (GOWN DISPOSABLE) ×5 IMPLANT
INST SET MINOR GENERAL (KITS) ×2 IMPLANT
KIT TURNOVER KIT A (KITS) ×2 IMPLANT
MANIFOLD NEPTUNE II (INSTRUMENTS) ×2 IMPLANT
MESH VENTRALEX ST 2.5 CRC MED (Mesh General) ×1 IMPLANT
NDL HYPO 18GX1.5 BLUNT FILL (NEEDLE) ×1 IMPLANT
NEEDLE HYPO 18GX1.5 BLUNT FILL (NEEDLE) ×2 IMPLANT
NEEDLE HYPO 22GX1.5 SAFETY (NEEDLE) ×2 IMPLANT
NS IRRIG 1000ML POUR BTL (IV SOLUTION) ×2 IMPLANT
PACK MINOR (CUSTOM PROCEDURE TRAY) ×2 IMPLANT
PAD ARMBOARD 7.5X6 YLW CONV (MISCELLANEOUS) ×2 IMPLANT
SET BASIN LINEN APH (SET/KITS/TRAYS/PACK) ×2 IMPLANT
SUT ETHIBOND NAB MO 7 #0 18IN (SUTURE) ×2 IMPLANT
SUT MNCRL AB 4-0 PS2 18 (SUTURE) ×2 IMPLANT
SUT VIC AB 2-0 CT2 27 (SUTURE) ×2 IMPLANT
SUT VIC AB 3-0 SH 27 (SUTURE) ×1
SUT VIC AB 3-0 SH 27X BRD (SUTURE) ×1 IMPLANT
SYR 20ML LL LF (SYRINGE) ×4 IMPLANT

## 2019-12-03 NOTE — Discharge Instructions (Signed)
Open Hernia Repair, Adult, Care After This sheet gives you information about how to care for yourself after your procedure. Your health care provider may also give you more specific instructions. If you have problems or questions, contact your health care provider. What can I expect after the procedure? After the procedure, it is common to have:  Mild discomfort.  Slight bruising.  Minor swelling.  Pain in the abdomen. Follow these instructions at home: Incision care   Follow instructions from your health care provider about how to take care of your incision area. Make sure you: ? Wash your hands with soap and water before you change your bandage (dressing). If soap and water are not available, use hand sanitizer. ? Change your dressing as told by your health care provider. ? Leave stitches (sutures), skin glue, or adhesive strips in place. These skin closures may need to stay in place for 2 weeks or longer. If adhesive strip edges start to loosen and curl up, you may trim the loose edges. Do not remove adhesive strips completely unless your health care provider tells you to do that.  Check your incision area every day for signs of infection. Check for: ? More redness, swelling, or pain. ? More fluid or blood. ? Warmth. ? Pus or a bad smell. Activity  Do not drive or use heavy machinery while taking prescription pain medicine. Do not drive until your health care provider approves.  Until your health care provider approves: ? Do not lift anything that is heavier than 10 lb (4.5 kg). ? Do not play contact sports.  Return to your normal activities as told by your health care provider. Ask your health care provider what activities are safe. General instructions  To prevent or treat constipation while you are taking prescription pain medicine, your health care provider may recommend that you: ? Drink enough fluid to keep your urine clear or pale yellow. ? Take over-the-counter or  prescription medicines. ? Eat foods that are high in fiber, such as fresh fruits and vegetables, whole grains, and beans. ? Limit foods that are high in fat and processed sugars, such as fried and sweet foods.  Take over-the-counter and prescription medicines only as told by your health care provider.  Do not take tub baths or go swimming until your health care provider approves.  Keep all follow-up visits as told by your health care provider. This is important. Contact a health care provider if:  You develop a rash.  You have more redness, swelling, or pain around your incision.  You have more fluid or blood coming from your incision.  Your incision feels warm to the touch.  You have pus or a bad smell coming from your incision.  You have a fever or chills.  You have blood in your stool (feces).  You have not had a bowel movement in 2-3 days.  Your pain is not controlled with medicine. Get help right away if:  You have chest pain or shortness of breath.  You feel light-headed or feel faint.  You have severe pain.  You vomit and your pain is worse. This information is not intended to replace advice given to you by your health care provider. Make sure you discuss any questions you have with your health care provider. Document Revised: 07/11/2017 Document Reviewed: 01/10/2016 Elsevier Patient Education  2020 Elsevier Inc.  

## 2019-12-03 NOTE — Consult Note (Signed)
Patient Demographics:    Dale Bender, is a 49 y.o. 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 Zeb Comfort with pseudoseizures on 10/05/2019 and advised to follow-up with mental health professionals for further counseling and to continue Lamictal -EEG is being repeated today on 12/03/2019 -Patient has had extensive neuro imaging studies over the last couple of years these were not repeated -Neurology consult from in-house neurologist Dr. Merlene Laughter has been requested -Patient's wife remains concerned about possible seizures -Time these episodes appear to be nonepileptiform -We will keep patient overnight for further observation -Continue Lamictal 400 mg nightly  2) history of depression anxiety-----continue Zoloft 150 mg daily  3) status post umbilical hernia surgery with mesh on 12/03/2019--- postop management per general surgeon Dr. Arnoldo Morale  4) obesity with OSA--- ABG on nonrebreather bag with PCO2 of 55 and pH of 7.34-----PO2 was 367 CPAP  nightly advised, avoid excessive supplemental oxygen due to CO2 retention  --Thank you Dr. Arnoldo Morale for this hospital/medical consultation request -We will follow patient along with you -   With History of - Reviewed by me  Past Medical History:  Diagnosis Date  . Anxiety   . Arthritis   . GERD (gastroesophageal reflux disease)   . Prostate nodule    benign  . Seizure ([place])    unknown orgin; on meds; last seizure 6 months ago.  . Sleep apnea    automatic  . Urethral stricture in male       Past Surgical History:  Procedure Laterality Date  . FRACTURE SURGERY     LT arm, LT leg, Rt leg  . PROSTATE BIOPSY    . URETHRAL STRICTURE DILATATION     CC--possible Sz    HPI:    Dale Bender  is a 49 y.o. male  with history of psychogenic nonepileptic spells who had further seizure-like episodes after surgery.  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 Zeb Comfort with pseudoseizures on 10/05/2019 and advised to follow-up with mental health professionals for further counseling and to continue Lamictal -EEG is being repeated today on 12/03/2019 -Patient has had extensive neuro imaging studies over the last couple of years these were not repeated -Neurology consult from in-house neurologist Dr. Merlene Laughter has been requested -Patient's wife remains concerned about possible seizures -Time these episodes appear to be nonepileptiform -We will keep patient overnight for further observation -Continue Lamictal 400 mg night  -ABG noted -Glucose 99 --Thank you  Dr. Arnoldo Morale for this hospital/medical consultation request    Review of systems:    In addition to the HPI above,   A full Review of  Systems was done, all other systems reviewed are negative except as noted above in HPI , .    Social History:  Reviewed by me    Social History   Tobacco Use  . Smoking status: Former Smoker    Packs/day: 0.50    Years: 10.00    Pack years: 5.00    Types: Cigarettes    Quit date: 12/01/2002    Years since quitting: 17.0  . Smokeless tobacco: Never Used  Substance Use Topics  . Alcohol use: Not Currently    Family History :  Reviewed by me    Family History  Problem Relation Age of Onset  . Hypertension Mother   . Diabetes Mother   . Other Father        died at age 38 in accident - log fell on him  . Kidney cancer Maternal Aunt   . Prostate cancer Paternal Uncle      Home Medications:   Prior to Admission medications   Medication Sig Start Date End Date Taking? Authorizing Provider  albuterol (VENTOLIN HFA) 108 (90 Base) MCG/ACT inhaler Inhale 2 puffs into the lungs every 6 (six) hours as needed for wheezing or shortness of breath. 08/06/19  Yes Manuella Ghazi, Pratik D, DO  LamoTRIgine 200 MG TB24 24 hour tablet Take 2 tablets (400 mg total) by mouth at bedtime. 09/16/19  Yes Marcial Pacas, MD  omeprazole (PRILOSEC) 40 MG capsule Take 40 mg by mouth daily. 07/14/19  Yes [provider]  sertraline (ZOLOFT) 100 MG tablet Take 1.5 tablets (150 mg total) by mouth daily. 11/29/19  Yes Hisada, Elie Goody, MD  oxyCODONE-acetaminophen (PERCOCET) 7.5-325 MG tablet Take 1 tablet by mouth every 6 (six) hours as needed. 12/03/19 12/02/20  Aviva Signs, MD     Allergies:    No Known Allergies   Physical Exam:   Vitals  Blood pressure (!) 148/92, pulse 75, temperature 98 F (36.7 C), resp. rate 19, SpO2 100 %.  Physical Examination: General appearance -sleepy, and in no distress Mental status -sleepy, opens eyes to tactile stimuli Eyes - sclera  anicteric Neck - supple, no JVD elevation , Chest - clear  to auscultation bilaterally, symmetrical air movement,  Heart - S1 and S2 normal, regular  Abdomen - soft,  , nondistended, no masses or organomegaly-- umbilical hernia surgical site is hemostatic Neurological -sleepy, lethargic, apparently had seizure-like activity earlier  extremities - no pedal edema noted, intact peripheral pulses  Skin - warm, dry     Data Review:    CBC No results for input(s): WBC, HGB, HCT, PLT, MCV, MCH, MCHC, RDW, LYMPHSABS, MONOABS, EOSABS, BASOSABS, BANDABS in the last 168 hours.  Invalid input(s): NEUTRABS, BANDSABD ------------------------------------------------------------------------------------------------------------------  Chemistries  No results for input(s): NA, K, CL, CO2, GLUCOSE, BUN,  CREATININE, CALCIUM, MG, AST, ALT, ALKPHOS, BILITOT in the last 168 hours.  Invalid input(s): GFRCGP ------------------------------------------------------------------------------------------------------------------ CrCl cannot be calculated (Patient's most recent lab result is older than the maximum 21 days allowed.). ------------------------------------------------------------------------------------------------------------------ No results for input(s): TSH, T4TOTAL, T3FREE, THYROIDAB in the last 72 hours.  Invalid input(s): FREET3   Coagulation profile No results for input(s): INR, PROTIME in the last 168 hours. ------------------------------------------------------------------------------------------------------------------- No results for input(s): DDIMER in the last 72 hours. -------------------------------------------------------------------------------------------------------------------  Cardiac Enzymes No results for input(s): CKMB, TROPONINI, MYOGLOBIN in the last 168 hours.  Invalid input(s): CK  ------------------------------------------------------------------------------------------------------------------ No results found for: BNP   ---------------------------------------------------------------------------------------------------------------  Urinalysis    Component Value Date/Time   COLORURINE YELLOW 07/28/2018 [place] 07/28/2018 0927   LABSPEC 1.013 07/28/2018 0927   PHURINE 8.0 07/28/2018 0927   GLUCOSEU NEGATIVE 07/28/2018 0927   HGBUR NEGATIVE 07/28/2018 0927   [place] NEGATIVE 07/28/2018 0927   KETONESUR NEGATIVE 07/28/2018 0927   PROTEINUR NEGATIVE 07/28/2018 0927   NITRITE NEGATIVE 07/28/2018 0927   LEUKOCYTESUR NEGATIVE 07/28/2018 0927    ----------------------------------------------------------------------------------------------------------------   Imaging Results:    No results found.  Radiological Exams on Admission: No results found.  DVT Prophylaxis -SCD/heparin3 AM Labs Ordered, also please review Full Orders  Family Communication: Admission, patients condition and plan of care including tests being ordered have been discussed with the patient and wife who indicate understanding and agree with the plan   Code Status - Full Code  Likely DC to  Home in am   Condition   stable  Roxan Hockey M.D on 12/03/2019 at 11:42 AM Go to [email or web address] -  for contact info  [facility] - Office  [phone]

## 2019-12-03 NOTE — Progress Notes (Signed)
Patient had normal intraoperative course without any episodes of hypotension. In recovery room, he continues to have what appears to be petit mall seizures with a postictal state. He is not hypoglycemic. He is lucid and moves all extremities when he is awake. He is alert and oriented. He then seems to have another petit mall seizure and again is postictal. I discussed this with the patient's wife. Will bring into the hospital for observation and consult the hospitalist.

## 2019-12-03 NOTE — Procedures (Signed)
Patient Name: Dale Bender  MRN: [id number]  Epilepsy Attending: Lora Havens  Referring Physician/Provider: Dr. Roxan Hockey Date: 12/03/2019 Duration: 22.39 mins  Patient history: 49 year old male with history of psychogenic nonepileptic spells who had further seizure-like episodes after surgery.  EEG evaluate for seizures.  Level of alertness: Awake  AEDs during EEG study: None  Technical aspects: This EEG study was done with scalp electrodes positioned according to the 10-20 International system of electrode placement. Electrical activity was acquired at a sampling rate of 500Hz  and reviewed with a high frequency filter of 70Hz  and a low frequency filter of 1Hz . EEG data were recorded continuously and digitally stored.   Description: The posterior dominant rhythm consists of 9-10 Hz activity of moderate voltage (25-35 uV) seen predominantly in posterior head regions, symmetric and reactive to eye opening and eye closing.         Hyperventilation and photic stimulation were not performed.  IMPRESSION: This study is within normal limits. No seizures or epileptiform discharges were seen throughout the recording.  Lyzette Reinhardt Barbra Sarks

## 2019-12-03 NOTE — Anesthesia Postprocedure Evaluation (Signed)
Anesthesia Post Note  Patient: Dale Bender  Procedure(s) Performed: HERNIA REPAIR UMBILICAL ADULT/ WITH MESH (N/A Abdomen)  Anesthesia Type: General  The patient has undergone a prolonged PACU course with eventual decision to admit for observation with hospitalist consult.  Upon completion of the procedure, this 48yo M with h/o seizure disorder and sleep apnea was awake and cooperative.  He moved himself from the OR table to the stretcher, and was able to communicate, including the location and severity of his pain.  Minutes later, he became completely obtunded.  Vital signs were always stable.  Blood sugar was within normal limits.  Over the course of the next few hours, he would intermittently wake up.  When awake, he would respond appropriately to questions and commands, and would move all extremities.  No facial droop was noted, and pupils were always reactive.    On a few occasions, twitching of the left hand and foot, as well as the eyelids were noted.  With the presumptive diagnosis of seizure, Midazolam 1mg  IV was administered twice.  Given that we aren't seeing a rapid recovery from the presumed post-ictal state, the patient is being admitted for observation and potential workup.  There are no acte cardiopulmonary issues present.  The anesthetic was short and uncomplicated, and there was an initial full recovery from the anesthesia.  Narcotics have been giving sparingly, and do not appear to be contributing to this somnolence.   Last Vitals:  Vitals:   12/03/19 0945 12/03/19 1000  BP: (!) 146/96 (!) 148/92  Pulse: 81 75  Resp: (!) 27 19  Temp:    SpO2: 100% 100%    Last Pain:  Vitals:   12/03/19 1023  TempSrc:   PainSc: [place]

## 2019-12-03 NOTE — Transfer of Care (Signed)
Immediate Anesthesia Transfer of Care Note  Patient: Dale Bender  Procedure(s) Performed: HERNIA REPAIR UMBILICAL ADULT/ WITH MESH (N/A Abdomen)  Patient Location: PACU  Anesthesia Type:General  Level of Consciousness: awake and patient cooperative  Airway & Oxygen Therapy: Patient Spontanous Breathing and Patient connected to nasal cannula oxygen  Post-op Assessment: Report given to RN and Post -op Vital signs reviewed and stable  Post vital signs: Reviewed and stable  Last Vitals:  Vitals Value Taken Time  BP 148/105 12/03/19 0820  Temp 98   Pulse 87 12/03/19 0822  Resp 18 12/03/19 0822  SpO2 97 % 12/03/19 0822  Vitals shown include unvalidated device data.  Last Pain:  Vitals:   12/03/19 0644  TempSrc: Oral  PainSc: 5       Patients Stated Pain Goal: 9 ([id number])  Complications: No apparent anesthesia complications

## 2019-12-03 NOTE — Anesthesia Procedure Notes (Signed)
Procedure Name: LMA Insertion Date/Time: 12/03/2019 7:38 AM Performed by: Vista Deck, CRNA Pre-anesthesia Checklist: Patient identified, Patient being monitored, Emergency Drugs available, Timeout performed and Suction available Patient Re-evaluated:Patient Re-evaluated prior to induction Oxygen Delivery Method: Circle System Utilized Preoxygenation: Pre-oxygenation with 100% oxygen Induction Type: IV induction Ventilation: Mask ventilation without difficulty LMA: LMA inserted LMA Size: 5.0 Number of attempts: 1 Placement Confirmation: positive ETCO2 and breath sounds checked- equal and bilateral Tube secured with: Tape Dental Injury: Teeth and Oropharynx as per pre-operative assessment

## 2019-12-03 NOTE — Interval H&P Note (Signed)
History and Physical Interval Note:  12/03/2019 7:11 AM  Dale Bender  has presented today for surgery, with the diagnosis of Umbilical hernia.  The various methods of treatment have been discussed with the patient and family. After consideration of risks, benefits and other options for treatment, the patient has consented to  Procedure(s): HERNIA REPAIR UMBILICAL ADULT/ WITH MESH (N/A) as a surgical intervention.  The patient's history has been reviewed, patient examined, no change in status, stable for surgery.  I have reviewed the patient's chart and labs.  Questions were answered to the patient's satisfaction.     Aviva Signs

## 2019-12-03 NOTE — Consult Note (Signed)
Dale A. Merlene [facility], MD     [email or web address]          Dale Bender is an 49 y.o. male.   ASSESSMENT/PLAN: 1. 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 GIVEN THIS, NO ADDITIONAL ANTIEPILEPTIC MEDICATIONS ARE NEEDED. HE IS ON LIMITED PRESUMABLY BECAUSE OF PSYCHOLOGICAL CONDITIONS. THIS CAN BE CONTINUED. 2. RIGHT LOWER EXTREMITY NUMBNESS AND WEAKNESS WITHOUT ANATOMIC CORRELATES. THIS IS THOUGHT TO BE PSYCHOSOMATIC IN NATURE. NO ADDITIONAL WORKUP IS REQUIRED.    Patient was admitted for her near surgery and developed multiple recurrent seizures. He does have a baseline history of non epileptic seizures. He has been worked up previously with repeat EEGs and also long-term video EEG all showing none epileptic spells. During the epilepsy monitoring which was conducted in [place] for a couple days, he had 2 events that showed no EEG correlates. Description the patient's events are outlined below in the notes. It appears to be generalized shaking activity including shaking of the head. The patient cannot give a description of the events. He tells me that he does lose consciousness however. The description of the events after his surgery here may be somewhat different than his previous events. The description seems to indicate that he loses consciousness with possible shaking on the left side. Another description states that he becomes unresponsive in passes out. The patient again cannot provide description of any of his events. He tells me that his seizure activity started after he had prostate surgery over a year ago. Concurrently with the onset of these episodic seizures spells. He has had weakness and numbness of the right leg. All these symptoms started after he had prostate surgery. He has been worked up for the right leg weakness which have been negative. Workup is reviewed. The workup includes MRI of the brain and the cervical spine. He also tells me  that he had nerve conduction testing which was unrevealing. The review of systems otherwise negative.    GENERAL: This is a very pleasant male who is doing well at this time.  HEENT: Neck is supple no trauma appreciated.  ABDOMEN: Soft  EXTREMITIES: No edema   BACK: Normal alignment.  SKIN: Normal by inspection.    MENTAL STATUS: Alert and oriented. Speech, language and cognition are generally intact. Judgment and insight normal.   CRANIAL NERVES: Pupils are equal, round and reactive to light and accommodation; extraocular movements are full, there is no significant nystagmus; upper and lower facial muscles are normal in strength and symmetric, there is no flattening of the nasolabial folds; tongue is midline; uvula is midline; shoulder elevation is normal.  MOTOR: Mild weakness of the right lower extremity graded as 4+/5 both proximally and distally. Bulk and tone are normal throughout. The other extremity shows normal tone, bulk and strength.  COORDINATION: Left finger to nose is normal, right finger to nose is normal, No rest tremor; no intention tremor; no postural tremor; no bradykinesia.  REFLEXES: Deep tendon reflexes are symmetrical and normal. Plantar responses are flexor bilaterally.   SENSATION: Reduced sensation involving the right leg to light touch and temperature.         Dale Bender is a 49 year old male, seen in request by his primary care physician from the [facility] for evaluation of seizure, he is accompanied by his wife Dale Bender, initial evaluation was on July 22, 2018.  I have reviewed and summarized the hospital discharge and neurology consult note on November 22 and 24,  2019.  He was diagnosed with urethral restriction since October 2019, painful urination, on July 03, 2018, he was arranged for prostate biopsy in conjunction with IV Levaquin, apparently had a seizure-like event during the procedure, per patient, he  had no recollection of the event, woke up in the hospital, reviewing hospital discharge, on the day of discharge on July 05, 2018, he had multiple seizure-like event witnessed by his wife and the physician, but immediately stopped with painful stimulation,  He presented to local hospital again on July 06, 2018 after he was discharged to home, wife reported multiple seizure-like spells, whole body trembling, body thrashing out of the bed, lasting for few minutes, multiple episode in a day, patient stated that he had no recollection of the event, did not have loss of consciousness.   Since hospital discharge, he also complains of right leg pain, weakness, sensory loss, difficulty walking, using walker at home,  He drives tow machine, heavy equipment operation  Personally reviewed MRI of the brain on July 03, 2018, motion degraded imaging, no acute abnormality identified,  Right leg has no feelings, walk with walker, left leg is ok, he has in continence.  EEG on Jul 04 2018: normal.  Laboratory evaluation showed normal or negative alcohol, HIV, UDS was positive for opiates, benzodiazepine, CMP, CBC  Blood pressure 117/72, pulse 70, temperature 97.7 F (36.5 C), temperature source Oral, resp. rate (!) 21, SpO2 95 %.  Past Medical History:  Diagnosis Date  . Anxiety   . Arthritis   . GERD (gastroesophageal reflux disease)   . Prostate nodule    benign  . Seizure ([place])    unknown orgin; on meds; last seizure 6 months ago.  . Sleep apnea    automatic  . 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 FRACTURE SURGERY     LT arm, LT leg, Rt leg  . PROSTATE BIOPSY    . URETHRAL STRICTURE DILATATION      Family History  Problem Relation Age of Onset  . Hypertension Mother   . Diabetes Mother   . Other Father        died at age 65 in accident - log fell on him  . Kidney cancer Maternal Aunt   . Prostate cancer Paternal Uncle      Social History:  reports that he quit smoking about 17 years ago. His smoking use included cigarettes. He has a 5.00 pack-year smoking history. He has never used smokeless tobacco. He reports previous alcohol use. He reports previous drug use.  Allergies: No Known Allergies  Medications: Prior to Admission medications   Medication Sig Start Date End Date Taking? Authorizing Provider  albuterol (VENTOLIN HFA) 108 (90 Base) MCG/ACT inhaler Inhale 2 puffs into the lungs every 6 (six) hours as needed for wheezing or shortness of breath. 08/06/19  Yes Manuella Ghazi, Pratik D, DO  LamoTRIgine 200 MG TB24 24 hour tablet Take 2 tablets (400 mg total) by mouth at bedtime. 09/16/19  Yes Marcial Pacas, MD  omeprazole (PRILOSEC) 40 MG capsule Take 40 mg by mouth daily. 07/14/19  Yes [provider]  sertraline (ZOLOFT) 100 MG tablet Take 1.5 tablets (150 mg total) by mouth daily. 11/29/19  Yes Hisada, Elie Goody, MD  oxyCODONE-acetaminophen (PERCOCET) 7.5-325 MG tablet Take 1 tablet by mouth every 6 (six) hours as needed. 12/03/19 12/02/20  Aviva Signs, MD    Scheduled Meds: . [START ON 12/04/2019] enoxaparin (LOVENOX) injection  40 mg Subcutaneous Q24H  . lamoTRIgine  400 mg Oral QHS  . pantoprazole  40 mg Oral Daily  . sertraline  150 mg Oral Daily   Continuous Infusions: . sodium chloride 50 mL/hr at 12/03/19 1627   PRN Meds:.acetaminophen **OR** acetaminophen, albuterol, HYDROmorphone (DILAUDID) injection, [COMPLETED] ketorolac **FOLLOWED BY** ketorolac, LORazepam, ondansetron **OR** ondansetron (ZOFRAN) IV, oxyCODONE     Results for orders placed or performed during the hospital encounter of 12/03/19 (from the past 48 hour(s))  Glucose, capillary     Status: None   Collection Time: 12/03/19  9:01 AM  Result Value Ref Range   Glucose-Capillary 99 70 - 99 mg/dL    Comment: Glucose reference range applies only to samples taken after fasting for at least 8 hours.  Glucose, capillary     Status: None     Collection Time: 12/03/19 11:12 AM  Result Value Ref Range   Glucose-Capillary 94 70 - 99 mg/dL    Comment: Glucose reference range applies only to samples taken after fasting for at least 8 hours.  Blood gas, arterial     Status: Abnormal   Collection Time: 12/03/19 12:13 PM  Result Value Ref Range   FIO2 100.00    pH, Arterial 7.338 (L) 7.350 - 7.450   pCO2 arterial 55.2 (H) 32.0 - 48.0 mmHg   pO2, Arterial 367 (H) 83.0 - 108.0 mmHg   Bicarbonate 26.3 20.0 - 28.0 mmol/L   Acid-Base Excess 3.5 (H) 0.0 - 2.0 mmol/L   O2 Saturation 99.4 %   Patient temperature 36.7    Allens test (pass/fail) PASS PASS    Comment: Performed at [facility], [address], [place], [place] [address]    Studies/Results:  BRAIN MRI 2019 CLINICAL DATA:  Seizures today during and after a prostate biopsy.  EXAM: MRI HEAD WITHOUT AND WITH CONTRAST  TECHNIQUE: Multiplanar, multiecho pulse sequences of the brain and surrounding structures were obtained without and with intravenous contrast.  CONTRAST:  10 mL Gadavist  COMPARISON:  Head CT 07/03/2018  FINDINGS: The study is motion degraded throughout with some sequences (including dedicated temporal lobe imaging) moderately to severely motion degraded.  Brain: There is no evidence of acute infarct, intracranial hemorrhage, mass, midline shift, or extra-axial fluid collection. The ventricles and sulci are normal. The brain is grossly normal in signal without definite abnormal enhancement identified within limitations of motion artifact. Detailed assessment of the mesial temporal lobes is precluded by motion artifact.  Vascular: Major intracranial vascular flow voids are preserved.  Skull and upper cervical spine: No suspicious calvarial lesion. Nonspecific diffusely diminished bone marrow T1 signal intensity in the upper cervical spine.  Sinuses/Orbits: Unremarkable orbits. Paranasal sinuses and mastoid air cells are  clear.  Other: None.  IMPRESSION: Motion degraded examination without acute intracranial abnormality identified.     L SPINE MRI 2019 CLINICAL DATA:  Loss of feeling in the right leg after prostate biopsy in November.  EXAM: MRI LUMBAR SPINE WITHOUT AND WITH CONTRAST  TECHNIQUE: Multiplanar and multiecho pulse sequences of the lumbar spine were obtained without and with intravenous contrast.  CONTRAST:  10 cc Gadavist  COMPARISON:  CT 06/18/2018  FINDINGS: Segmentation:  5 lumbar type vertebral bodies.  Alignment:  Normal  Vertebrae:  Normal  Conus medullaris and cauda equina: Conus extends to the L1 level. Conus and cauda equina appear normal.  Paraspinal and other soft tissues: Normal  Disc levels:  The discs are normal. No degeneration, bulge or herniation. No stenosis of the  canal or foramina. No significant facet arthropathy.  IMPRESSION: Normal examination. No abnormality seen to explain the presenting symptoms.    MRI T SPINE 2020 IMPRESSION: This MRI of the thoracic spine without contrast shows the following: 1.   The spinal cord appears normal.  No spinal stenosis is noted. 2.    Degenerative changes as detailed above causing right foraminal narrowing at C7-T1 and T2-T3 but no definite nerve root compression.  MRI C SPINE 2020 IMPRESSION: This MRI of the cervical spine shows the following: 1.   At C3-C4, there are degenerative changes causing moderate foraminal narrowing bilaterally with some encroachment upon the C4 nerve roots. 2.   At C5-C6, there is moderate spinal stenosis with left greater than right foraminal narrowing and some encroachment upon the left C6 nerve root. 3.   At C6-C7, there is moderate spinal stenosis due to central disc herniation and other degenerative changes but no nerve root compression. 4.   The spinal cord appears normal.      Malijah Lietz A. Merlene Bender, M.D.  Diplomate, Tax adviser of Psychiatry and  Neurology ( Neurology). 12/03/2019, 6:26 PM

## 2019-12-03 NOTE — Progress Notes (Signed)
EEG Completed; Results Pending  

## 2019-12-03 NOTE — Op Note (Signed)
Patient:  Dale Bender  DOB:  1970-11-16  MRN:  [id number]   Preop Diagnosis: Umbilical hernia  Postop Diagnosis: Same  Procedure: Umbilical herniorrhaphy with mesh  Surgeon: Aviva Signs, MD  Anes: General  Indications: Patient is a 49 year old black male who presents with a symptomatic umbilical hernia.  The risks and benefits of the procedure including bleeding, infection, mesh use, and the possibility of recurrence of the hernia were fully explained to the patient, who gave informed consent.  Procedure note: The patient was placed in supine position.  After general anesthesia was administered, the abdomen was prepped and draped using the usual sterile technique with ChloraPrep.  Surgical site confirmation was performed.  An infraumbilical incision was made down to the fascia.  The umbilicus was freed away from the underlying fascia.  The patient had approximately 2 to 3 cm hernia defect.  I palpated the underside of the abdominal wall and no other hernias were noted.  A 6.4 cm Bard Ventralax ST patch was then inserted and secured to the fascia using 0 Ethibond interrupted sutures.  The overlying fascia was then closed transversely using 0 Ethibond interrupted sutures.  The umbilicus was secured back to the fascia using a 2-0 Vicryl interrupted suture.  The subcutaneous layer was reapproximated using 3-0 Vicryl interrupted sutures.  Exparel was instilled into the surrounding wound.  The skin was closed using a 4-0 Monocryl subcuticular suture.  Dermabond was applied.  All tape and needle counts were correct at the end of the procedure.  The patient was awakened and transferred to PACU in stable condition.  Complications: None  EBL: Minimal  Specimen: None

## 2019-12-03 NOTE — Anesthesia Preprocedure Evaluation (Signed)
Anesthesia Evaluation  Patient identified by MRN, date of birth, ID band Patient awake    Reviewed: Allergy & Precautions, H&P , NPO status , Patient's Chart, lab work & pertinent test results, reviewed documented beta blocker date and time   Airway Mallampati: II  TM Distance: >3 FB Neck ROM: full    Dental no notable dental hx.    Pulmonary sleep apnea , former smoker,    Pulmonary exam normal breath sounds clear to auscultation       Cardiovascular Exercise Tolerance: Good negative cardio ROS   Rhythm:regular Rate:Normal     Neuro/Psych Seizures -,  PSYCHIATRIC DISORDERS Anxiety Depression    GI/Hepatic Neg liver ROS, GERD  Medicated,  Endo/Other  negative endocrine ROS  Renal/GU CRFRenal disease  negative genitourinary   Musculoskeletal   Abdominal   Peds  Hematology negative hematology ROS (+)   Anesthesia Other Findings   Reproductive/Obstetrics negative OB ROS                             Anesthesia Physical Anesthesia Plan  ASA: III  Anesthesia Plan: General   Post-op Pain Management:    Induction:   PONV Risk Score and Plan: 2 and Ondansetron  Airway Management Planned:   Additional Equipment:   Intra-op Plan:   Post-operative Plan:   Informed Consent: I have reviewed the patients History and Physical, chart, labs and discussed the procedure including the risks, benefits and alternatives for the proposed anesthesia with the patient or authorized representative who has indicated his/her understanding and acceptance.     Dental Advisory Given  Plan Discussed with: CRNA  Anesthesia Plan Comments:         Anesthesia Quick Evaluation

## 2019-12-03 NOTE — Progress Notes (Signed)
Patient wears at [place] according to sleep study 20 / 15 with back up rate of 16. His Split sleep study showed severe sleep apnea. He failed CPAP and was transitioned to BiPAP with a rate. He has been placed here on V60 BiPAP 20/15 rate 16, 28 oxygen. Medium full face mask. This is only at sleep. Room air other wise unless oxygen is needed.

## 2019-12-04 DIAGNOSIS — K429 Umbilical hernia without obstruction or gangrene: Secondary | ICD-10-CM | POA: Diagnosis not present

## 2019-12-04 LAB — CBC
HCT: 43.8 % (ref 39.0–52.0)
Hemoglobin: 14 g/dL (ref 13.0–17.0)
MCH: 28.3 pg (ref 26.0–34.0)
MCHC: 32 g/dL (ref 30.0–36.0)
MCV: 88.7 fL (ref 80.0–100.0)
Platelets: 165 10*3/uL (ref 150–400)
RBC: 4.94 MIL/uL (ref 4.22–5.81)
RDW: 13.1 % (ref 11.5–15.5)
WBC: 5 10*3/uL (ref 4.0–10.5)
nRBC: 0 % (ref 0.0–0.2)

## 2019-12-04 LAB — BASIC METABOLIC PANEL
Anion gap: 7 (ref 5–15)
BUN: 17 mg/dL (ref 6–20)
CO2: 30 mmol/L (ref 22–32)
Calcium: 8.5 mg/dL — ABNORMAL LOW (ref 8.9–10.3)
Chloride: 104 mmol/L (ref 98–111)
Creatinine, Ser: 1.37 mg/dL — ABNORMAL HIGH (ref 0.61–1.24)
GFR calc Af Amer: >60 mL/min (ref >60)
GFR calc non Af Amer: >60 mL/min (ref >60)
Glucose, Bld: 104 mg/dL — ABNORMAL HIGH (ref 70–99)
Potassium: 4.5 mmol/L (ref 3.5–5.1)
Sodium: 141 mmol/L (ref 135–145)

## 2019-12-04 LAB — PHOSPHORUS: Phosphorus: 3.9 mg/dL (ref 2.5–4.6)

## 2019-12-04 LAB — MAGNESIUM: Magnesium: 2.1 mg/dL (ref 1.7–2.4)

## 2019-12-04 LAB — MRSA PCR SCREENING: MRSA by PCR: NEGATIVE

## 2019-12-04 NOTE — Progress Notes (Signed)
Changed BiPAP mask to large mask. Patient opens mouth while asleep, Mouth breaks seal at bottom of mask.

## 2019-12-04 NOTE — Progress Notes (Signed)
-  Please see full consult note dated 12/03/2019 -Case was discussed with neurologist Dr. Merlene [facility] on 12/03/2019 -EEG report from 12/03/2019 noted -Prior video EEG reports from February 2021 noted -Discussed with primary/attending physician Dr. Aviva Signs -Patient was discharged on 12/04/2019 by Dr. Arnoldo Morale prior to hospitalization being able to round on patient -Per report the patient had no new medical problems warranting further hospitalization intervention prior to discharge  Roxan Hockey, MD

## 2019-12-04 NOTE — Discharge Summary (Signed)
Physician Discharge Summary  Patient ID: Dale Bender MRN: [id number] DOB/AGE: Jul 21, 1971 49 y.o.  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  Intraoperatively, his surgery was uneventful.  In the PACU, he had several episodes of what appeared to be post ictal states.  It was elected to admit him to the hospital for observation.  The hospitalist were consulted and an EEG was performed.  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  The patient does have episodes of sleep apnea which are being addressed as an outpatient.  The patient is being discharged home on 12/04/2019 in good and improving condition.  Consults: Hospitalist  Significant Diagnostic Studies: EEG  Treatments: surgery: Umbilical herniorrhaphy with mesh on 12/03/2019  Discharge Exam: Blood pressure 106/64, pulse 76, temperature 98.2 F (36.8 C), temperature source Oral, resp. rate (!) 28, weight 118.5 kg, SpO2 95 %. General appearance: alert, cooperative and no distress Resp: clear to auscultation bilaterally Cardio: regular rate and rhythm, S1, S2 normal, no murmur, click, rub or gallop GI: Soft, incision healing well.  Disposition: Discharge disposition: 01-Home or Self Care       Discharge Instructions    Diet - low sodium heart healthy   Complete by: As  directed    Increase activity slowly   Complete by: As directed    Increase activity slowly   Complete by: As directed      Allergies as of 12/04/2019   No Known Allergies     Medication List    TAKE these medications   albuterol 108 (90 Base) MCG/ACT inhaler Commonly known as: VENTOLIN HFA Inhale 2 puffs into the lungs every 6 (six) hours as needed for wheezing or shortness of breath.   LamoTRIgine 200 MG Tb24 24 hour tablet Take 2 tablets (400 mg total) by mouth at bedtime.   omeprazole 40 MG capsule Commonly known as: PRILOSEC Take 40 mg by mouth daily.   oxyCODONE-acetaminophen 7.5-325 MG tablet Commonly known as: Percocet Take 1 tablet by mouth every 6 (six) hours as needed.   sertraline 100 MG tablet Commonly known as: ZOLOFT Take 1.5 tablets (150 mg total) by mouth daily.      Follow-up Information    Aviva Signs, MD.   Specialty: General Surgery Why: As needed.  Will call you for follow up in 1 -2 weeks Contact information: [address] [place] [id number]           Signed: Aviva Signs 12/04/2019, 10:32 AM

## 2019-12-10 ENCOUNTER — Telehealth (INDEPENDENT_AMBULATORY_CARE_PROVIDER_SITE_OTHER): Payer: Medicaid Other | Admitting: General Surgery

## 2019-12-10 DIAGNOSIS — Z09 Encounter for follow-up examination after completed treatment for conditions other than malignant neoplasm: Secondary | ICD-10-CM

## 2019-12-13 ENCOUNTER — Telehealth (INDEPENDENT_AMBULATORY_CARE_PROVIDER_SITE_OTHER): Payer: Self-pay | Admitting: General Surgery

## 2019-12-13 ENCOUNTER — Encounter: Payer: Self-pay | Admitting: General Surgery

## 2019-12-13 DIAGNOSIS — Z09 Encounter for follow-up examination after completed treatment for conditions other than malignant neoplasm: Secondary | ICD-10-CM

## 2019-12-13 NOTE — Telephone Encounter (Signed)
Telephone visit documented

## 2019-12-13 NOTE — Progress Notes (Signed)
Subjective:     Dale Bender  Telephone visit performed.  Patient's wife answered the phone.  She states he has been doing well since discharge.  He has not had any further seizures.  His incision is healing well.  He is not taking any pain medication. Objective:    There were no vitals taken for this visit.  General:  Telephone visit       Assessment:    Doing well postoperatively.    Plan:   May increase activity as able.  Follow-up here as needed.

## 2019-12-21 NOTE — Progress Notes (Signed)
I have reviewed and agreed above plan. 

## 2019-12-31 ENCOUNTER — Other Ambulatory Visit: Payer: Self-pay | Admitting: Ophthalmology

## 2019-12-31 DIAGNOSIS — H532 Diplopia: Secondary | ICD-10-CM

## 2020-01-14 NOTE — Telephone Encounter (Signed)
Open in error

## 2020-02-10 ENCOUNTER — Telehealth: Payer: Self-pay | Admitting: *Deleted

## 2020-02-10 NOTE — Telephone Encounter (Signed)
Patient has [place] medicaid plan through [place] [phone]). TG#[id number] P. His extended release lamotrigine has been approved through 02/09/2021. Case [id number].

## 2020-02-10 NOTE — Telephone Encounter (Signed)
I called [facility] and left a message notifying them of this approval.

## 2020-02-16 NOTE — Progress Notes (Deleted)
[place] MD/PA/NP OP Progress Note  02/16/2020 4:18 PM Dale Bender  MRN:  [id number]  Chief Complaint:  HPI:  Herniation, post operation Visit Diagnosis: No diagnosis found.  Past Psychiatric History: Please see initial evaluation for full details. I have reviewed the history. No updates at this time.     Past Medical History:  Past Medical History:  Diagnosis Date  . Anxiety   . Arthritis   . GERD (gastroesophageal reflux disease)   . Prostate nodule    benign  . Seizure ([place])    unknown orgin; on meds; last seizure 6 months ago.  . Sleep apnea    automatic  . 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 FRACTURE SURGERY     LT arm, LT leg, Rt leg  . PROSTATE BIOPSY    . UMBILICAL HERNIA REPAIR N/A 12/03/2019   Procedure: HERNIA REPAIR UMBILICAL ADULT/ WITH MESH;  Surgeon: Aviva Signs, MD;  Location: [facility];  Service: General;  Laterality: N/A;  . URETHRAL STRICTURE DILATATION      Family Psychiatric History: Please see initial evaluation for full details. I have reviewed the history. No updates at this time.     Family History:  Family History  Problem Relation Age of Onset  . Hypertension Mother   . Diabetes Mother   . Other Father        died at age 58 in accident - log fell on him  . Kidney cancer Maternal Aunt   . Prostate cancer Paternal Uncle     Social History:  Social History   Socioeconomic History  . Marital status: Married    Spouse name: Not on file  . Number of children: 2  . Years of education: 10th grade  . Highest education level: Not on file  Occupational History  . Occupation: tow Geophysical data processor  Tobacco Use  . Smoking status: Former Smoker    Packs/day: 0.50    Years: 10.00    Pack years: 5.00    Types: Cigarettes    Quit date: 12/01/2002    Years since quitting: 17.2  . Smokeless tobacco: Never Used  Vaping Use  . Vaping Use: Never used  Substance and Sexual Activity  . Alcohol use:  Not Currently  . Drug use: Not Currently  . Sexual activity: Yes  Other Topics Concern  . Not on file  Social History Narrative   Lives at home with his wife and children.   Right-handed.   2 cups caffeine per day.   Social Determinants of Health   Financial Resource Strain:   . Difficulty of Paying Living Expenses:   Food Insecurity:   . Worried About Charity fundraiser in the Last Year:   . Arboriculturist in the Last Year:   Transportation Needs:   . Film/video editor (Medical):   Marland Kitchen Lack of Transportation (Non-Medical):   Physical Activity:   . Days of Exercise per Week:   . Minutes of Exercise per Session:   Stress:   . Feeling of Stress :   Social Connections:   . Frequency of Communication with Friends and Family:   . Frequency of Social Gatherings with Friends and Family:   . Attends Religious Services:   . Active Member of Clubs or Organizations:   . Attends Archivist Meetings:   Marland Kitchen Marital Status:     Allergies: No Known Allergies  Metabolic Disorder Labs:  No results found for: HGBA1C, MPG No results found for: PROLACTIN Lab Results  Component Value Date   TRIG 187 (H) 08/05/2019   Lab Results  Component Value Date   TSH 2.430 06/10/2019   TSH 1.87 06/01/2019    Therapeutic Level Labs: No results found for: LITHIUM No results found for: VALPROATE No components found for:  CBMZ  Current Medications: Current Outpatient Medications  Medication Sig Dispense Refill  . albuterol (VENTOLIN HFA) 108 (90 Base) MCG/ACT inhaler Inhale 2 puffs into the lungs every 6 (six) hours as needed for wheezing or shortness of breath. 8 g 0  . LamoTRIgine 200 MG TB24 24 hour tablet Take 2 tablets (400 mg total) by mouth at bedtime. 60 tablet 11  . omeprazole (PRILOSEC) 40 MG capsule Take 40 mg by mouth daily.    Marland Kitchen oxyCODONE-acetaminophen (PERCOCET) 7.5-325 MG tablet Take 1 tablet by mouth every 6 (six) hours as needed. 25 tablet 0  . sertraline (ZOLOFT)  100 MG tablet Take 1.5 tablets (150 mg total) by mouth daily. 135 tablet 0   No current facility-administered medications for this visit.     Musculoskeletal: Strength & Muscle Tone: N/A Gait & Station: N/A Patient leans: N/A  Psychiatric Specialty Exam: Review of Systems  There were no vitals taken for this visit.There is no height or weight on file to calculate BMI.  General Appearance: {Appearance:22683}  Eye Contact:  {BHH EYE CONTACT:22684}  Speech:  Clear and Coherent  Volume:  Normal  Mood:  {BHH MOOD:22306}  Affect:  {Affect (PAA):22687}  Thought Process:  Coherent  Orientation:  Full (Time, Place, and Person)  Thought Content: Logical   Suicidal Thoughts:  {ST/HT (PAA):22692}  Homicidal Thoughts:  {ST/HT (PAA):22692}  Memory:  Immediate;   Good  Judgement:  {Judgement (PAA):22694}  Insight:  {Insight (PAA):22695}  Psychomotor Activity:  Normal  Concentration:  Concentration: Good and Attention Span: Good  Recall:  Good  Fund of Knowledge: Good  Language: Good  Akathisia:  No  Handed:  Right  AIMS (if indicated): not done  Assets:  Communication Skills Desire for Improvement  ADL's:  Intact  Cognition: WNL  Sleep:  {BHH GOOD/FAIR/POOR:22877}   Screenings:   Assessment and Plan:  Dale Bender is a 49 y.o. year old male with a history of depression, complex partial seizure, who presents for follow up appointment for below.    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  Psychosocial stressors include stabilization from seizure episodes, and right leg weakness.  Will continue current dose of sertraline as maintenance therapy for depression.  Discussed with the patient to stay on this medication at least for a few more months to avoid relapse in his mood symptoms (medication was uptitrated to the current dose since 05/2019).  He agrees with the plan.    Plan 1.Continuesertraline 150 mg daily  2.Next appointment: 7/13 at 10:40 for 20 mins, video 3.ReviewedTSH; wnl  The patient demonstrates the following risk factors for suicide: Chronic risk factors for suicide include:psychiatric disorder ofdepression. Acute risk factorsfor suicide include: loss (financial, interpersonal, professional). Protective factorsfor this patient include: positive social support, responsibility to others (children, family), coping skills and hope for the future. Considering these factors, the overall suicide risk at this point appears to below. Patientisappropriate for outpatient follow up.   [name], MD 02/16/2020, 4:18 PM

## 2020-02-17 ENCOUNTER — Other Ambulatory Visit: Payer: Self-pay

## 2020-02-17 ENCOUNTER — Ambulatory Visit: Payer: Medicaid Other | Admitting: Neurology

## 2020-02-17 ENCOUNTER — Encounter: Payer: Self-pay | Admitting: Neurology

## 2020-02-17 ENCOUNTER — Telehealth: Payer: Self-pay | Admitting: Neurology

## 2020-02-17 VITALS — BP 134/77 | HR 82 | Ht 73.0 in | Wt 270.0 lb

## 2020-02-17 DIAGNOSIS — G40909 Epilepsy, unspecified, not intractable, without status epilepticus: Secondary | ICD-10-CM | POA: Diagnosis not present

## 2020-02-17 DIAGNOSIS — G4733 Obstructive sleep apnea (adult) (pediatric): Secondary | ICD-10-CM | POA: Diagnosis not present

## 2020-02-17 DIAGNOSIS — R269 Unspecified abnormalities of gait and mobility: Secondary | ICD-10-CM | POA: Diagnosis not present

## 2020-02-17 MED ORDER — LAMOTRIGINE ER 200 MG PO TB24: 400.0000 mg | ORAL_TABLET | Freq: Every day | ORAL | 4 refills | Status: DC

## 2020-02-17 NOTE — Telephone Encounter (Signed)
Dr. Krista Blue Please advise ?   This pt had an EMU stay back in February. Are you sure this is the right pt? I did find an order from Dr. Krista Blue. Just wanted to make sure. Insurance may not cover a 2nd stay.  Levada Dy

## 2020-02-17 NOTE — Telephone Encounter (Signed)
I have put in EMU monitoring order for him, please schedule him  Lora Havens, MD  Neurology 02/17/2020 End  02/17/20    Phone: [phone]; Fax: [phone]

## 2020-02-17 NOTE — Telephone Encounter (Signed)
Relayed message to Levada Dy Dr. Hortense Ramal assistant thanks

## 2020-02-17 NOTE — Telephone Encounter (Signed)
Significant Diagnostic Studies:  EEG: This study is within normal limits. No seizures or epileptiform discharges were seen throughout the recording.  One event was recorded on 10/04/2019 at 2114as described above without EEG change and was a nonepileptic event.  Treatments: Continue Lamotrigine 400mg  daily  Signed: Lora Havens 10/05/2019, 10:31 AM   I reviewed EMU monitoring in February 2021, I will cancelprevious order.

## 2020-02-17 NOTE — Progress Notes (Signed)
HISTORY OF PRESENT ILLNESS Dale Bender is a 49 year old male, seen in request by his primary care physician from the [place] practice for evaluation of seizure, he is accompanied by his wife Dale Bender, initial evaluation was on July 22, 2018.  I have reviewed and summarized the hospital discharge and neurology consult note on November 22 and 24, 2019.  He was diagnosed with urethral restriction since October 2019, painful urination, on July 03, 2018, he was arranged for prostate biopsy in conjunction with IV Levaquin, apparently had a seizure-like event during the procedure, per patient, he had no recollection of the event, woke up in the hospital, reviewing hospital discharge, on the day of discharge on July 05, 2018, he had multiple seizure-like event witnessed by his wife and the physician, but immediately stopped with painful stimulation,  He presented to local hospital again on July 06, 2018 after he was discharged to home, wife reported multiple seizure-like spells, whole body trembling, body thrashing out of the bed, lasting for few minutes, multiple episode in a day, patient stated that he had no recollection of the event, did not have loss of consciousness.   Since hospital discharge, he also complains of right leg pain, weakness, sensory loss, difficulty walking, using walker at home,  He drives tow machine, heavy equipment operation  Personally reviewed MRI of the brain on July 03, 2018, motion degraded imaging, no acute abnormality identified,  Right leg has no feelings, walk with walker, left leg is ok, he has in continence, \  EEG on Jul 04 2018: normal.  Laboratory evaluation showed normal or negative alcohol, HIV, UDS was positive for opiates, benzodiazepine, CMP, CBC  UPDATE Sept 1 2020: He had 2 seizure activity, fall happened during sleep, wake him up from sleep, generalized tonic-clonic seizure, in June, and on March 24, 2019,  EEG  was abnormal on April 05, 2019, there are frequent bilateral frontotemporal dominance 3 Hz spike slow activity, consistent with generalized epilepsy disorder.  He was taking lamotrigine 100 mg twice a day, now increased to lamotrigine ER 300 mg every night  He continue complains of low back pain, right leg numbness, weakness, using bilateral crutches to walk, variable effort during today's examination  I personally reviewed MRI of lumbar spine December 2019, with no significant abnormality noted. EMG nerve conduction study was normal in January 2020, there is no evidence of right lower extremity neuropathy or right lumbosacral radiculopathy  Laboratory evaluations in September 2019, normal CBC, CMP showed elevated creatinine 1.4, negative HIV, UDS was positive for benzodiazepine and opiates.  Update February 16, 3266: He had umbilical hernia repair in April 2021, per hospital record, he had several episodes of post ictal status, had EEG by Dr. Hortense Ramal on December 03, 2019, that was reported normal  Seen by neurologist Dr. Merlene Laughter, document episode suspicious for pseudoseizure, but there was no detailed prescription of the event  Laboratory evaluation February 2021, lamotrigine level was 8.2  He also has the diagnosis of obstructive sleep apnea, more than 90% compliance per downloaded record   REVIEW OF SYSTEMS: Out of a complete 14 system review of symptoms, the patient complains only of the following symptoms, and all other reviewed systems are negative.  See HPI  ALLERGIES: No Known Allergies  HOME MEDICATIONS: Outpatient Medications Prior to Visit  Medication Sig Dispense Refill  . albuterol (VENTOLIN HFA) 108 (90 Base) MCG/ACT inhaler Inhale 2 puffs into the lungs every 6 (six) hours as needed for wheezing or shortness  of breath. 8 g 0  . LamoTRIgine 200 MG TB24 24 hour tablet Take 2 tablets (400 mg total) by mouth at bedtime. 60 tablet 11  . omeprazole (PRILOSEC) 40 MG capsule Take 40  mg by mouth daily.    Marland Kitchen oxyCODONE-acetaminophen (PERCOCET) 7.5-325 MG tablet Take 1 tablet by mouth every 6 (six) hours as needed. 25 tablet 0  . sertraline (ZOLOFT) 100 MG tablet Take 1.5 tablets (150 mg total) by mouth daily. 135 tablet 0   No facility-administered medications prior to visit.    PAST MEDICAL HISTORY: Past Medical History:  Diagnosis Date  . Anxiety   . Arthritis   . GERD (gastroesophageal reflux disease)   . Prostate nodule    benign  . Seizure ([place])    unknown orgin; on meds; last seizure 6 months ago.  . Sleep apnea    automatic  . 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 FRACTURE SURGERY     LT arm, LT leg, Rt leg  . PROSTATE BIOPSY    . UMBILICAL HERNIA REPAIR N/A 12/03/2019   Procedure: HERNIA REPAIR UMBILICAL ADULT/ WITH MESH;  Surgeon: Aviva Signs, MD;  Location: [facility];  Service: General;  Laterality: N/A;  . URETHRAL STRICTURE DILATATION      FAMILY HISTORY: Family History  Problem Relation Age of Onset  . Hypertension Mother   . Diabetes Mother   . Other Father        died at age 63 in accident - log fell on him  . Kidney cancer Maternal Aunt   . Prostate cancer Paternal Uncle     SOCIAL HISTORY: Social History   Socioeconomic History  . Marital status: Married    Spouse name: Not on file  . Number of children: 2  . Years of education: 10th grade  . Highest education level: Not on file  Occupational History  . Occupation: tow Geophysical data processor  Tobacco Use  . Smoking status: Former Smoker    Packs/day: 0.50    Years: 10.00    Pack years: 5.00    Types: Cigarettes    Quit date: 12/01/2002    Years since quitting: 17.2  . Smokeless tobacco: Never Used  Vaping Use  . Vaping Use: Never used  Substance and Sexual Activity  . Alcohol use: Not Currently  . Drug use: Not Currently  . Sexual activity: Yes  Other Topics Concern  . Not on file  Social History  Narrative   Lives at home with his wife and children.   Right-handed.   2 cups caffeine per day.   Social Determinants of Health   Financial Resource Strain:   . Difficulty of Paying Living Expenses:   Food Insecurity:   . Worried About Charity fundraiser in the Last Year:   . Arboriculturist in the Last Year:   Transportation Needs:   . Film/video editor (Medical):   Marland Kitchen Lack of Transportation (Non-Medical):   Physical Activity:   . Days of Exercise per Week:   . Minutes of Exercise per Session:   Stress:   . Feeling of Stress :   Social Connections:   . Frequency of Communication with Friends and Family:   . Frequency of Social Gatherings with Friends and Family:   . Attends Religious Services:   . Active Member of Clubs or Organizations:   . Attends Archivist Meetings:   .  Marital Status:   Intimate Partner Violence:   . Fear of Current or Ex-Partner:   . Emotionally Abused:   Marland Kitchen Physically Abused:   . Sexually Abused:       PHYSICAL EXAM  Vitals:   02/17/20 1126  BP: 134/77  Pulse: 82  Weight: 270 lb (122.5 kg)  Height: 6\' 1"  (1.854 m)   Body mass index is 35.62 kg/m.  Generalized: Well developed, in no acute distress  Chest: Lungs clear to auscultation bilaterally  Neurological examination  Mentation: Alert oriented to time, place, history taking. Follows all commands speech and language fluent Cranial nerve II-XII: Extraocular movements were full, visual field were full on confrontational test Head turning and shoulder shrug  were normal and symmetric. Motor: The motor testing reveals 5 over 5 strength of all 4 extremities with the exception of weakness in the right lower extremity.   Sensory: Sensory testing is intact to soft touch on all 4 extremities. No evidence of extinction is noted.  Gait and station: Need to push up to get up from seated position, dragging his right leg, cautious   DIAGNOSTIC DATA (LABS, IMAGING, TESTING) - I  reviewed patient records, labs, notes, testing and imaging myself where available.  Lab Results  Component Value Date   WBC 5.0 12/04/2019   HGB 14.0 12/04/2019   HCT 43.8 12/04/2019   MCV 88.7 12/04/2019   PLT 165 12/04/2019      Component Value Date/Time   NA 141 12/04/2019 0423   NA 140 06/10/2019 0818   K 4.5 12/04/2019 0423   CL 104 12/04/2019 0423   CO2 30 12/04/2019 0423   GLUCOSE 104 (H) 12/04/2019 0423   BUN 17 12/04/2019 0423   BUN 12 06/10/2019 0818   CREATININE 1.37 (H) 12/04/2019 0423   CALCIUM 8.5 (L) 12/04/2019 0423   PROT 6.3 (L) 10/04/2019 1114   PROT 6.6 06/10/2019 0818   ALBUMIN 4.2 10/04/2019 1114   ALBUMIN 4.7 06/10/2019 0818   AST 27 10/04/2019 1114   ALT 31 10/04/2019 1114   ALKPHOS 55 10/04/2019 1114   BILITOT 0.9 10/04/2019 1114   BILITOT 0.6 06/10/2019 0818   GFRNONAA >60 12/04/2019 0423   GFRAA >60 12/04/2019 0423   Lab Results  Component Value Date   TRIG 187 (H) 08/05/2019   No results found for: HGBA1C Lab Results  Component Value Date   VITAMINB12 351 06/10/2019   Lab Results  Component Value Date   TSH 2.430 06/10/2019      ASSESSMENT AND PLAN 49 y.o. year old male  Recurrent seizure-like event,  Most recent one was in April 3893, post umbilical hernia repair surgery  From previous laboratory evaluations, he apparently compliant with his epileptic medications, lamotrigine level in February 2021 was 8.2  Refilled lamotrigine ER 200 mg 2 tablets every night  Referred to EMU monitoring, previous abnormal EEG at office  Gait abnormality  Extensive evaluation in the past, including normal MRI of lumbar spine, EMG nerve conduction study, clearly a functional component.   Obstructive sleep apnea  More than 90% compliance with his CPAP machine  Marcial Pacas, M.D. Ph.D.  [facility] [place], [place] [address] Phone: [phone] Fax:      [phone]

## 2020-02-17 NOTE — Telephone Encounter (Signed)
Noted Referral sent to [facility]

## 2020-02-18 LAB — LAMOTRIGINE LEVEL: Lamotrigine Lvl: 8.9 ug/mL (ref 2.0–20.0)

## 2020-02-21 ENCOUNTER — Other Ambulatory Visit: Payer: Medicaid Other

## 2020-02-22 ENCOUNTER — Telehealth (HOSPITAL_COMMUNITY): Payer: Medicaid Other | Admitting: Psychiatry

## 2020-03-01 NOTE — Progress Notes (Signed)
Virtual Visit via Video Note  I connected with Dale Bender on 03/07/20 at 12:00 PM EDT by a video enabled telemedicine application and verified that I am speaking with the correct person using two identifiers.   I discussed the limitations of evaluation and management by telemedicine and the availability of in person appointments. The patient expressed understanding and agreed to proceed.    I discussed the assessment and treatment plan with the patient. The patient was provided an opportunity to ask questions and all were answered. The patient agreed with the plan and demonstrated an understanding of the instructions.   The patient was advised to call back or seek an in-person evaluation if the symptoms worsen or if the condition fails to improve as anticipated.  Location: patient- home, provider- home office   I provided 10 minutes of non-face-to-face time during this encounter.   [name], MD    [facility] MD/PA/NP OP Progress Note  03/07/2020 12:08 PM Dale Bender  MRN:  [id number]  Chief Complaint:  Chief Complaint    Depression; Follow-up     HPI:  This is a follow-up appointment for depression.  He states that he has been doing well.  He enjoys playing with his children.  He enjoys Curator as well.  His wife helps him to go out.  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  He has not had any seizures since last May.  He sleeps well.  He denies feeling depressed.  He has fair concentration.  He has good appetite.  He denies SI.  He denies panic attacks.  He denies any concerns about his medication.   Visit Diagnosis:    ICD-10-CM   1. Major depressive disorder with single episode, in full remission ([place])  F32.5 sertraline (ZOLOFT) 100 MG tablet    Past Psychiatric History: Please see initial evaluation for full details. I have reviewed the history. No  updates at this time.     Past Medical History:  Past Medical History:  Diagnosis Date  . Anxiety   . Arthritis   . GERD (gastroesophageal reflux disease)   . Prostate nodule    benign  . Seizure ([place])    unknown orgin; on meds; last seizure 6 months ago.  . Sleep apnea    automatic  . 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 FRACTURE SURGERY     LT arm, LT leg, Rt leg  . PROSTATE BIOPSY    . UMBILICAL HERNIA REPAIR N/A 12/03/2019   Procedure: HERNIA REPAIR UMBILICAL ADULT/ WITH MESH;  Surgeon: Aviva Signs, MD;  Location: [facility];  Service: General;  Laterality: N/A;  . URETHRAL STRICTURE DILATATION      Family Psychiatric History: Please see initial evaluation for full details. I have reviewed the history. No updates at this time.     Family History:  Family History  Problem Relation Age of Onset  . Hypertension Mother   . Diabetes Mother   . Other Father        died at age 54 in accident - log fell on him  . Kidney cancer Maternal Aunt   . Prostate cancer Paternal Uncle     Social History:  Social History   Socioeconomic History  . Marital status: Married    Spouse name: Not on file  .  Number of children: 2  . Years of education: 10th grade  . Highest education level: Not on file  Occupational History  . Occupation: tow Geophysical data processor  Tobacco Use  . Smoking status: Former Smoker    Packs/day: 0.50    Years: 10.00    Pack years: 5.00    Types: Cigarettes    Quit date: 12/01/2002    Years since quitting: 17.2  . Smokeless tobacco: Never Used  Vaping Use  . Vaping Use: Never used  Substance and Sexual Activity  . Alcohol use: Not Currently  . Drug use: Not Currently  . Sexual activity: Yes  Other Topics Concern  . Not on file  Social History Narrative   Lives at home with his wife and children.   Right-handed.   2 cups caffeine per day.   Social Determinants of Health   Financial Resource Strain:    . Difficulty of Paying Living Expenses:   Food Insecurity:   . Worried About Charity fundraiser in the Last Year:   . Arboriculturist in the Last Year:   Transportation Needs:   . Film/video editor (Medical):   Marland Kitchen Lack of Transportation (Non-Medical):   Physical Activity:   . Days of Exercise per Week:   . Minutes of Exercise per Session:   Stress:   . Feeling of Stress :   Social Connections:   . Frequency of Communication with Friends and Family:   . Frequency of Social Gatherings with Friends and Family:   . Attends Religious Services:   . Active Member of Clubs or Organizations:   . Attends Archivist Meetings:   Marland Kitchen Marital Status:     Allergies: No Known Allergies  Metabolic Disorder Labs: No results found for: HGBA1C, MPG No results found for: PROLACTIN Lab Results  Component Value Date   TRIG 187 (H) 08/05/2019   Lab Results  Component Value Date   TSH 2.430 06/10/2019   TSH 1.87 06/01/2019    Therapeutic Level Labs: No results found for: LITHIUM No results found for: VALPROATE No components found for:  CBMZ  Current Medications: Current Outpatient Medications  Medication Sig Dispense Refill  . albuterol (VENTOLIN HFA) 108 (90 Base) MCG/ACT inhaler Inhale 2 puffs into the lungs every 6 (six) hours as needed for wheezing or shortness of breath. 8 g 0  . LamoTRIgine 200 MG TB24 24 hour tablet Take 2 tablets (400 mg total) by mouth at bedtime. 180 tablet 4  . omeprazole (PRILOSEC) 40 MG capsule Take 40 mg by mouth daily.    Marland Kitchen oxyCODONE-acetaminophen (PERCOCET) 7.5-325 MG tablet Take 1 tablet by mouth every 6 (six) hours as needed. 25 tablet 0  . sertraline (ZOLOFT) 100 MG tablet Take 1.5 tablets (150 mg total) by mouth daily. 135 tablet 0   No current facility-administered medications for this visit.     Musculoskeletal: Strength & Muscle Tone: N/A Gait & Station: N?A Patient leans: N/A  Psychiatric Specialty Exam: Review of Systems   Psychiatric/Behavioral: Negative for agitation, behavioral problems, confusion, decreased concentration, dysphoric mood, hallucinations, self-injury, sleep disturbance and suicidal ideas. The patient is nervous/anxious. The patient is not hyperactive.   All other systems reviewed and are negative.   There were no vitals taken for this visit.There is no height or weight on file to calculate BMI.  General Appearance: Fairly Groomed  Eye Contact:  Good  Speech:  Clear and Coherent  Volume:  Normal  Mood:  good  Affect:  Appropriate, Congruent and euthymic  Thought Process:  Coherent  Orientation:  Full (Time, Place, and Person)  Thought Content: Logical   Suicidal Thoughts:  No  Homicidal Thoughts:  No  Memory:  Immediate;   Good  Judgement:  Good  Insight:  Good  Psychomotor Activity:  Normal  Concentration:  Concentration: Good and Attention Span: Good  Recall:  Good  Fund of Knowledge: Good  Language: Good  Akathisia:  No  Handed:  Right  AIMS (if indicated): not done  Assets:  Communication Skills Desire for Improvement  ADL's:  Intact  Cognition: WNL  Sleep:  Good   Screenings:   Assessment and Plan:  Dale Bender is a 49 y.o. year old male with a history of depression, complex partial seizure, who presents for follow up appointment for below.   1. Major depressive disorder with single episode, in full remission ([place]) He denies any significant mood symptoms except self-limiting anxiety.  Psychosocial stressors includes demoralization from seizure, and right leg weakness.  He agrees to continue current dose of sertraline as maintenance therapy for depression.  We will plan to stay on this medication at the next visit  (medication was uptitrated to the current dose since 05/2019).   Plan 1.Continuesertraline 150 mg daily  2.Next appointment: 10/26 at 9:10 for 20 mins, video 3.ReviewedTSH; wnl  The patient demonstrates the following risk factors for suicide:  Chronic risk factors for suicide include:psychiatric disorder ofdepression. Acute risk factorsfor suicide include: loss (financial, interpersonal, professional). Protective factorsfor this patient include: positive social support, responsibility to others (children, family), coping skills and hope for the future. Considering these factors, the overall suicide risk at this point appears to below. Patientisappropriate for outpatient follow up.  [name], MD 03/07/2020, 12:08 PM

## 2020-03-07 ENCOUNTER — Telehealth (INDEPENDENT_AMBULATORY_CARE_PROVIDER_SITE_OTHER): Payer: Medicaid Other | Admitting: Psychiatry

## 2020-03-07 ENCOUNTER — Other Ambulatory Visit: Payer: Self-pay

## 2020-03-07 ENCOUNTER — Encounter (HOSPITAL_COMMUNITY): Payer: Self-pay | Admitting: Psychiatry

## 2020-03-07 DIAGNOSIS — F325 Major depressive disorder, single episode, in full remission: Secondary | ICD-10-CM

## 2020-03-07 MED ORDER — SERTRALINE HCL 100 MG PO TABS: 150.0000 mg | ORAL_TABLET | Freq: Every day | ORAL | 0 refills | Status: DC

## 2020-04-05 ENCOUNTER — Encounter: Payer: Self-pay | Admitting: Adult Health

## 2020-05-16 DIAGNOSIS — B348 Other viral infections of unspecified site: Secondary | ICD-10-CM | POA: Insufficient documentation

## 2020-05-16 DIAGNOSIS — R062 Wheezing: Secondary | ICD-10-CM | POA: Insufficient documentation

## 2020-05-30 DIAGNOSIS — J189 Pneumonia, unspecified organism: Secondary | ICD-10-CM | POA: Insufficient documentation

## 2020-05-31 NOTE — Progress Notes (Deleted)
[facility] MD/PA/NP OP Progress Note  05/31/2020 4:10 PM Dale Bender  MRN:  [id number]  Chief Complaint:  HPI: *** Visit Diagnosis: No diagnosis found.  Past Psychiatric History: Please see initial evaluation for full details. I have reviewed the history. No updates at this time.     Past Medical History:  Past Medical History:  Diagnosis Date  . Anxiety   . Arthritis   . GERD (gastroesophageal reflux disease)   . Prostate nodule    benign  . Seizure ([place])    unknown orgin; on meds; last seizure 6 months ago.  . Sleep apnea    automatic  . 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 FRACTURE SURGERY     LT arm, LT leg, Rt leg  . PROSTATE BIOPSY    . UMBILICAL HERNIA REPAIR N/A 12/03/2019   Procedure: HERNIA REPAIR UMBILICAL ADULT/ WITH MESH;  Surgeon: Aviva Signs, MD;  Location: [facility];  Service: General;  Laterality: N/A;  . URETHRAL STRICTURE DILATATION      Family Psychiatric History: Please see initial evaluation for full details. I have reviewed the history. No updates at this time.     Family History:  Family History  Problem Relation Age of Onset  . Hypertension Mother   . Diabetes Mother   . Other Father        died at age 64 in accident - log fell on him  . Kidney cancer Maternal Aunt   . Prostate cancer Paternal Uncle     Social History:  Social History   Socioeconomic History  . Marital status: Married    Spouse name: Not on file  . Number of children: 2  . Years of education: 10th grade  . Highest education level: Not on file  Occupational History  . Occupation: tow Geophysical data processor  Tobacco Use  . Smoking status: Former Smoker    Packs/day: 0.50    Years: 10.00    Pack years: 5.00    Types: Cigarettes    Quit date: 12/01/2002    Years since quitting: 17.5  . Smokeless tobacco: Never Used  Vaping Use  . Vaping Use: Never used  Substance and Sexual Activity  . Alcohol use: Not Currently  . Drug  use: Not Currently  . Sexual activity: Yes  Other Topics Concern  . Not on file  Social History Narrative   Lives at home with his wife and children.   Right-handed.   2 cups caffeine per day.   Social Determinants of Health   Financial Resource Strain:   . Difficulty of Paying Living Expenses: Not on file  Food Insecurity:   . Worried About Charity fundraiser in the Last Year: Not on file  . Ran Out of Food in the Last Year: Not on file  Transportation Needs:   . Lack of Transportation (Medical): Not on file  . Lack of Transportation (Non-Medical): Not on file  Physical Activity:   . Days of Exercise per Week: Not on file  . Minutes of Exercise per Session: Not on file  Stress:   . Feeling of Stress : Not on file  Social Connections:   . Frequency of Communication with Friends and Family: Not on file  . Frequency of Social Gatherings with Friends and Family: Not on file  . Attends Religious Services: Not on file  . Active Member of Clubs or Organizations: Not on file  . Attends  Club or Organization Meetings: Not on file  . Marital Status: Not on file    Allergies: No Known Allergies  Metabolic Disorder Labs: No results found for: HGBA1C, MPG No results found for: PROLACTIN Lab Results  Component Value Date   TRIG 187 (H) 08/05/2019   Lab Results  Component Value Date   TSH 2.430 06/10/2019   TSH 1.87 06/01/2019    Therapeutic Level Labs: No results found for: LITHIUM No results found for: VALPROATE No components found for:  CBMZ  Current Medications: Current Outpatient Medications  Medication Sig Dispense Refill  . albuterol (VENTOLIN HFA) 108 (90 Base) MCG/ACT inhaler Inhale 2 puffs into the lungs every 6 (six) hours as needed for wheezing or shortness of breath. 8 g 0  . LamoTRIgine 200 MG TB24 24 hour tablet Take 2 tablets (400 mg total) by mouth at bedtime. 180 tablet 4  . omeprazole (PRILOSEC) 40 MG capsule Take 40 mg by mouth daily.    Marland Kitchen  oxyCODONE-acetaminophen (PERCOCET) 7.5-325 MG tablet Take 1 tablet by mouth every 6 (six) hours as needed. 25 tablet 0  . sertraline (ZOLOFT) 100 MG tablet Take 1.5 tablets (150 mg total) by mouth daily. 135 tablet 0   No current facility-administered medications for this visit.     Musculoskeletal: Strength & Muscle Tone: N/A Gait & Station: N/A Patient leans: N/A  Psychiatric Specialty Exam: Review of Systems  There were no vitals taken for this visit.There is no height or weight on file to calculate BMI.  General Appearance: {Appearance:22683}  Eye Contact:  {BHH EYE CONTACT:22684}  Speech:  Clear and Coherent  Volume:  Normal  Mood:  {BHH MOOD:22306}  Affect:  {Affect (PAA):22687}  Thought Process:  Coherent  Orientation:  Full (Time, Place, and Person)  Thought Content: Logical   Suicidal Thoughts:  {ST/HT (PAA):22692}  Homicidal Thoughts:  {ST/HT (PAA):22692}  Memory:  Immediate;   Good  Judgement:  {Judgement (PAA):22694}  Insight:  {Insight (PAA):22695}  Psychomotor Activity:  Normal  Concentration:  Concentration: Good and Attention Span: Good  Recall:  Good  Fund of Knowledge: Good  Language: Good  Akathisia:  No  Handed:  Right  AIMS (if indicated): not done  Assets:  Communication Skills Desire for Improvement  ADL's:  Intact  Cognition: WNL  Sleep:  {BHH GOOD/FAIR/POOR:22877}   Screenings:   Assessment and Plan:  Dale Bender is a 49 y.o. year old male with a history of  depression, complex partial seizure, who presents for follow up appointment for below.    1. Major depressive disorder with single episode, in full remission ([place]) He denies any significant mood symptoms except self-limiting anxiety.  Psychosocial stressors includes demoralization from seizure, and right leg weakness.  He agrees to continue current dose of sertraline as maintenance therapy for depression.  We will plan to stay on this medication at the next visit  (medication was  uptitrated to the current dose since 05/2019).   Plan 1.Continuesertraline 150 mg daily  2.Next appointment: 10/26 at 9:10 for 20 mins, video 3.ReviewedTSH; wnl  The patient demonstrates the following risk factors for suicide: Chronic risk factors for suicide include:psychiatric disorder ofdepression. Acute risk factorsfor suicide include: loss (financial, interpersonal, professional). Protective factorsfor this patient include: positive social support, responsibility to others (children, family), coping skills and hope for the future. Considering these factors, the overall suicide risk at this point appears to below. Patientisappropriate for outpatient follow up.   [name], MD 05/31/2020, 4:10 PM

## 2020-06-06 ENCOUNTER — Telehealth (HOSPITAL_COMMUNITY): Payer: Medicaid Other | Admitting: Psychiatry

## 2020-06-07 NOTE — Progress Notes (Deleted)
[facility] MD/PA/NP OP Progress Note  06/07/2020 3:06 PM Dale Bender  MRN:  [id number]  Chief Complaint:  HPI: *** Visit Diagnosis: No diagnosis found.  Past Psychiatric History: Please see initial evaluation for full details. I have reviewed the history. No updates at this time.     Past Medical History:  Past Medical History:  Diagnosis Date  . Anxiety   . Arthritis   . GERD (gastroesophageal reflux disease)   . Prostate nodule    benign  . Seizure ([place])    unknown orgin; on meds; last seizure 6 months ago.  . Sleep apnea    automatic  . 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 FRACTURE SURGERY     LT arm, LT leg, Rt leg  . PROSTATE BIOPSY    . UMBILICAL HERNIA REPAIR N/A 12/03/2019   Procedure: HERNIA REPAIR UMBILICAL ADULT/ WITH MESH;  Surgeon: Aviva Signs, MD;  Location: [facility];  Service: General;  Laterality: N/A;  . URETHRAL STRICTURE DILATATION      Family Psychiatric History: Please see initial evaluation for full details. I have reviewed the history. No updates at this time.     Family History:  Family History  Problem Relation Age of Onset  . Hypertension Mother   . Diabetes Mother   . Other Father        died at age 51 in accident - log fell on him  . Kidney cancer Maternal Aunt   . Prostate cancer Paternal Uncle     Social History:  Social History   Socioeconomic History  . Marital status: Married    Spouse name: Not on file  . Number of children: 2  . Years of education: 10th grade  . Highest education level: Not on file  Occupational History  . Occupation: tow Geophysical data processor  Tobacco Use  . Smoking status: Former Smoker    Packs/day: 0.50    Years: 10.00    Pack years: 5.00    Types: Cigarettes    Quit date: 12/01/2002    Years since quitting: 17.5  . Smokeless tobacco: Never Used  Vaping Use  . Vaping Use: Never used  Substance and Sexual Activity  . Alcohol use: Not Currently  . Drug  use: Not Currently  . Sexual activity: Yes  Other Topics Concern  . Not on file  Social History Narrative   Lives at home with his wife and children.   Right-handed.   2 cups caffeine per day.   Social Determinants of Health   Financial Resource Strain:   . Difficulty of Paying Living Expenses: Not on file  Food Insecurity:   . Worried About Charity fundraiser in the Last Year: Not on file  . Ran Out of Food in the Last Year: Not on file  Transportation Needs:   . Lack of Transportation (Medical): Not on file  . Lack of Transportation (Non-Medical): Not on file  Physical Activity:   . Days of Exercise per Week: Not on file  . Minutes of Exercise per Session: Not on file  Stress:   . Feeling of Stress : Not on file  Social Connections:   . Frequency of Communication with Friends and Family: Not on file  . Frequency of Social Gatherings with Friends and Family: Not on file  . Attends Religious Services: Not on file  . Active Member of Clubs or Organizations: Not on file  . Attends  Club or Organization Meetings: Not on file  . Marital Status: Not on file    Allergies: No Known Allergies  Metabolic Disorder Labs: No results found for: HGBA1C, MPG No results found for: PROLACTIN Lab Results  Component Value Date   TRIG 187 (H) 08/05/2019   Lab Results  Component Value Date   TSH 2.430 06/10/2019   TSH 1.87 06/01/2019    Therapeutic Level Labs: No results found for: LITHIUM No results found for: VALPROATE No components found for:  CBMZ  Current Medications: Current Outpatient Medications  Medication Sig Dispense Refill  . albuterol (VENTOLIN HFA) 108 (90 Base) MCG/ACT inhaler Inhale 2 puffs into the lungs every 6 (six) hours as needed for wheezing or shortness of breath. 8 g 0  . LamoTRIgine 200 MG TB24 24 hour tablet Take 2 tablets (400 mg total) by mouth at bedtime. 180 tablet 4  . omeprazole (PRILOSEC) 40 MG capsule Take 40 mg by mouth daily.    Marland Kitchen  oxyCODONE-acetaminophen (PERCOCET) 7.5-325 MG tablet Take 1 tablet by mouth every 6 (six) hours as needed. 25 tablet 0  . sertraline (ZOLOFT) 100 MG tablet Take 1.5 tablets (150 mg total) by mouth daily. 135 tablet 0   No current facility-administered medications for this visit.     Musculoskeletal: Strength & Muscle Tone: N/A Gait & Station: N/A Patient leans: N/A  Psychiatric Specialty Exam: Review of Systems  There were no vitals taken for this visit.There is no height or weight on file to calculate BMI.  General Appearance: {Appearance:22683}  Eye Contact:  {BHH EYE CONTACT:22684}  Speech:  Clear and Coherent  Volume:  Normal  Mood:  {BHH MOOD:22306}  Affect:  {Affect (PAA):22687}  Thought Process:  Coherent  Orientation:  Full (Time, Place, and Person)  Thought Content: Logical   Suicidal Thoughts:  {ST/HT (PAA):22692}  Homicidal Thoughts:  {ST/HT (PAA):22692}  Memory:  Immediate;   Good  Judgement:  {Judgement (PAA):22694}  Insight:  {Insight (PAA):22695}  Psychomotor Activity:  Normal  Concentration:  Concentration: Good and Attention Span: Good  Recall:  Good  Fund of Knowledge: Good  Language: Good  Akathisia:  No  Handed:  Right  AIMS (if indicated): not done  Assets:  Communication Skills Desire for Improvement  ADL's:  Intact  Cognition: WNL  Sleep:  {BHH GOOD/FAIR/POOR:22877}   Screenings:   Assessment and Plan:  Dale Bender is a 49 y.o. year old male with a history of  depression, complex partial seizure , who presents for follow up appointment for below.    1. Major depressive disorder with single episode, in full remission ([place]) He denies any significant mood symptoms except self-limiting anxiety.  Psychosocial stressors includes demoralization from seizure, and right leg weakness.  He agrees to continue current dose of sertraline as maintenance therapy for depression.  We will plan to stay on this medication at the next visit  (medication was  uptitrated to the current dose since 05/2019).   Plan 1.Continuesertraline 150 mg daily  2.Next appointment: 10/26 at 9:10 for 20 mins, video 3.ReviewedTSH; wnl  The patient demonstrates the following risk factors for suicide: Chronic risk factors for suicide include:psychiatric disorder ofdepression. Acute risk factorsfor suicide include: loss (financial, interpersonal, professional). Protective factorsfor this patient include: positive social support, responsibility to others (children, family), coping skills and hope for the future. Considering these factors, the overall suicide risk at this point appears to below. Patientisappropriate for outpatient follow up.   [name], MD 06/07/2020, 3:06 PM
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
## 2020-06-14 ENCOUNTER — Encounter: Payer: Self-pay | Admitting: Psychiatry

## 2020-06-14 ENCOUNTER — Other Ambulatory Visit: Payer: Self-pay

## 2020-06-14 ENCOUNTER — Telehealth (INDEPENDENT_AMBULATORY_CARE_PROVIDER_SITE_OTHER): Payer: Medicaid Other | Admitting: Psychiatry

## 2020-06-14 ENCOUNTER — Telehealth (HOSPITAL_COMMUNITY): Payer: Medicaid Other | Admitting: Psychiatry

## 2020-06-14 DIAGNOSIS — F320 Major depressive disorder, single episode, mild: Secondary | ICD-10-CM | POA: Diagnosis not present

## 2020-06-14 MED ORDER — SERTRALINE HCL 100 MG PO TABS: 200.0000 mg | ORAL_TABLET | Freq: Every day | ORAL | 0 refills | Status: DC

## 2020-06-14 NOTE — Patient Instructions (Signed)
1 .Increasesertraline 200 mg daily  2.Next appointment: 1/5 at 9:10

## 2020-06-30 ENCOUNTER — Encounter: Payer: Self-pay | Admitting: Cardiology

## 2020-06-30 NOTE — Progress Notes (Signed)
Cardiology Office Note  Date: 07/03/2020   ID: Dale Bender, DOB 07-12-1971, MRN [id number]  PCP:  Denny Levy, [place]  Cardiologist:  Rozann Lesches, MD Electrophysiologist:  None   Chief Complaint  Patient presents with  . Left shoulder pain    History of Present Illness: Dale Bender is a 49 y.o. male former patient of Dr. Bronson Ing now presenting to establish follow-up with me.  I reviewed his records and updated the chart.  He was last assessed via telehealth encounter in January.  He presents reporting left shoulder pain with motion, soreness in his upper back radiating around to the left anterior chest wall.  Also some pain in his lower back.  He does not recall any injury.  States that it hurts worse when he lays down on his back or lifts his arm up over his head.  He was seen in the ER [facility] at [facility]  High-sensitivity troponin I levels were normal and his ECG showed normal sinus rhythm with no acute ST segment changes.  He did have a chest CT which showed no acute intrathoracic process to explain symptoms.  Incidentally noted small pulmonary nodule was seen as well as hepatic steatosis.  Cardiac testing from last year is reviewed below.  Overall reassuring.  Past Medical History:  Diagnosis Date  . Anxiety   . Arthritis   . GERD (gastroesophageal reflux disease)   . History of seizures   . Prostate nodule    Benign  . Sleep apnea   . 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 FRACTURE SURGERY     LT arm, LT leg, Rt leg  . PROSTATE BIOPSY    . UMBILICAL HERNIA REPAIR N/A 12/03/2019   Procedure: HERNIA REPAIR UMBILICAL ADULT/ WITH MESH;  Surgeon: Aviva Signs, MD;  Location: [facility];  Service: General;  Laterality: N/A;  . URETHRAL STRICTURE DILATATION      Current Outpatient Medications  Medication Sig Dispense Refill  . albuterol (VENTOLIN HFA) 108 (90 Base) MCG/ACT inhaler Inhale 2 puffs into  the lungs every 6 (six) hours as needed for wheezing or shortness of breath. 8 g 0  . LamoTRIgine 200 MG TB24 24 hour tablet Take 2 tablets (400 mg total) by mouth at bedtime. 180 tablet 4  . omeprazole (PRILOSEC) 40 MG capsule Take 40 mg by mouth daily.    Marland Kitchen oxyCODONE-acetaminophen (PERCOCET) 7.5-325 MG tablet Take 1 tablet by mouth every 6 (six) hours as needed. 25 tablet 0  . sertraline (ZOLOFT) 100 MG tablet Take 2 tablets (200 mg total) by mouth daily. 180 tablet 0   No current facility-administered medications for this visit.   Allergies:  Patient has no known allergies.   ROS: No palpitations or syncope.  Physical Exam: VS:  BP 118/90   Pulse 71   Ht 6\' 1"  (1.854 m)   Wt 276 lb (125.2 kg)   SpO2 97%   BMI 36.41 kg/m , BMI Body mass index is 36.41 kg/m.  Wt Readings from Last 3 Encounters:  07/03/20 276 lb (125.2 kg)  02/17/20 270 lb (122.5 kg)  12/04/19 261 lb 3.9 oz (118.5 kg)    General: Patient appears comfortable at rest. HEENT: Conjunctiva and lids normal, wearing a mask. Neck: Supple, no elevated JVP or carotid bruits, no thyromegaly. Lungs: Clear to auscultation, nonlabored breathing at rest. Cardiac: Regular rate and rhythm, no S3 or significant systolic murmur, no pericardial  rub. Extremities: No pitting edema, distal pulses 2+.  Patient reports soreness to movement of his left shoulder joint, also palpation of his left upper back.  ECG:  An ECG dated 10/04/2019 was personally reviewed today and demonstrated:  Normal sinus rhythm.  Recent Labwork: 10/04/2019: ALT 31; AST 27 12/04/2019: BUN 17; Creatinine, Ser 1.37; Hemoglobin 14.0; Magnesium 2.1; Platelets 165; Potassium 4.5; Sodium 141     Component Value Date/Time   TRIG 187 (H) 08/05/2019 1401    Other Studies Reviewed Today:  Lexiscan Myoview 05/25/2019:  The study is normal.  This is a low risk study.  The left ventricular ejection fraction is normal (55-65%).  Blood pressure demonstrated a  normal response to exercise.  There was no ST segment deviation noted during stress.   Normal resting and stress perfusion. No ischemia or infarction EF 60%  Echocardiogram 07/28/2019: 1. Left ventricular ejection fraction, by visual estimation, is 60 to  65%. The left ventricle has normal function. There is mildly increased  left ventricular hypertrophy.  2. Left ventricular diastolic parameters are consistent with Grade I  diastolic dysfunction (impaired relaxation).  3. The left ventricle has no regional wall motion abnormalities.  4. Global right ventricle has normal systolic function.The right  ventricular size is normal. No increase in right ventricular wall  thickness.  5. Left atrial size was normal.  6. Right atrial size was normal.  7. The mitral valve is normal in structure. Trivial mitral valve  regurgitation. No evidence of mitral stenosis.  8. The tricuspid valve is normal in structure. Tricuspid valve  regurgitation is not demonstrated.  9. The aortic valve is tricuspid. Aortic valve regurgitation is not  visualized. No evidence of aortic valve sclerosis or stenosis.  10. The pulmonic valve was not well visualized. Pulmonic valve  regurgitation is not visualized.  11. The aortic root was not well visualized.  12. The interatrial septum was not well visualized.   Assessment and Plan:  1.  Left shoulder and upper thoracic discomfort as discussed above, musculoskeletal sounding in etiology and very atypical for cardiac pain. Recent ER visit noted with normal high-sensitivity troponin I levels and ECG.  Suggested follow-up with PCP and consider referral to orthopedist for further assessment.  No additional cardiac testing planned at this time.  2.  Reassuring cardiac evaluation by testing last year, outlined above.  LVEF normal and no evidence of ischemia.  Medication Adjustments/Labs and Tests Ordered: Current medicines are reviewed at length with the patient  today.  Concerns regarding medicines are outlined above.   Tests Ordered: No orders of the defined types were placed in this encounter.   Medication Changes: No orders of the defined types were placed in this encounter.   Disposition:  Follow up as needed.  Signed, Satira Sark, MD, [facility] 07/03/2020 9:09 AM    [facility] [place], [place], [place] [address] Phone: [phone]; Fax: [phone]

## 2020-07-03 ENCOUNTER — Encounter: Payer: Self-pay | Admitting: *Deleted

## 2020-07-03 ENCOUNTER — Ambulatory Visit (INDEPENDENT_AMBULATORY_CARE_PROVIDER_SITE_OTHER): Payer: Medicaid Other | Admitting: Cardiology

## 2020-07-03 ENCOUNTER — Encounter: Payer: Self-pay | Admitting: Cardiology

## 2020-07-03 VITALS — BP 118/90 | HR 71 | Ht 73.0 in | Wt 276.0 lb

## 2020-07-03 DIAGNOSIS — M25512 Pain in left shoulder: Secondary | ICD-10-CM | POA: Diagnosis not present

## 2020-07-03 DIAGNOSIS — R0602 Shortness of breath: Secondary | ICD-10-CM

## 2020-07-03 NOTE — Patient Instructions (Addendum)

## 2020-07-24 ENCOUNTER — Ambulatory Visit: Payer: Medicaid Other | Admitting: Neurology

## 2020-07-24 ENCOUNTER — Telehealth: Payer: Self-pay | Admitting: Neurology

## 2020-07-24 ENCOUNTER — Encounter: Payer: Self-pay | Admitting: Neurology

## 2020-07-24 VITALS — BP 120/74 | HR 84 | Ht 73.0 in | Wt 277.4 lb

## 2020-07-24 DIAGNOSIS — G40909 Epilepsy, unspecified, not intractable, without status epilepticus: Secondary | ICD-10-CM | POA: Diagnosis not present

## 2020-07-24 DIAGNOSIS — G4733 Obstructive sleep apnea (adult) (pediatric): Secondary | ICD-10-CM

## 2020-07-24 MED ORDER — MODAFINIL 100 MG PO TABS: 100.0000 mg | ORAL_TABLET | Freq: Every day | ORAL | 5 refills | Status: DC

## 2020-07-24 NOTE — Progress Notes (Addendum)
PATIENT: Dale Bender DOB: January 14, 1971  REASON FOR VISIT: follow up HISTORY FROM: patient  HISTORY OF PRESENT ILLNESS: Today 07/24/20  HISTORY Dale Bender is a 49 year old male, seen in request by his primary care physician from the [place] practice for evaluation of seizure, he is accompanied by his wife Dale Bender, initial evaluation was on July 22, 2018.  I have reviewed and summarized the hospital discharge and neurology consult note on November 22 and 24, 2019.  He was diagnosed with urethral restriction since October 2019, painful urination, on July 03, 2018, he was arranged for prostate biopsy in conjunction with IV Levaquin, apparently had a seizure-like event during the procedure, per patient, he had no recollection of the event, woke up in the hospital, reviewing hospital discharge, on the day of discharge on July 05, 2018, he had multiple seizure-like event witnessed by his wife and the physician, but immediately stopped with painful stimulation,  He presented to local hospital again on July 06, 2018 after he was discharged to home, wife reported multiple seizure-like spells, whole body trembling, body thrashing out of the bed, lasting for few minutes, multiple episode in a day, patient stated that he had no recollection of the event, did not have loss of consciousness.   Since hospital discharge, he also complains of right leg pain, weakness, sensory loss, difficulty walking, using walker at home,  He drives tow machine, heavy equipment operation  Personally reviewed MRI of the brain on July 03, 2018, motion degraded imaging, no acute abnormality identified,  Right leg has no feelings, walk with walker, left leg is ok, he has in continence, \  EEG on Jul 04 2018: normal.  Laboratory evaluation showed normal or negative alcohol, HIV, UDS was positive for opiates, benzodiazepine, CMP, CBC  UPDATE Sept 1 2020: He had 2  seizure activity, fall happened during sleep, wake him up from sleep, generalized tonic-clonic seizure, in June, and on March 24, 2019,  EEG was abnormal on April 05, 2019, there are frequent bilateral frontotemporal dominance 3 Hz spike slow activity, consistent with generalized epilepsy disorder.  He was taking lamotrigine 100 mg twice a day, now increased to lamotrigine ER 300 mg every night  He continue complains of low back pain, right leg numbness, weakness, using bilateral crutches to walk, variable effort during today's examination  I personally reviewed MRI of lumbar spine December 2019, with no significant abnormality noted. EMG nerve conduction study was normal in January 2020, there is no evidence of right lower extremity neuropathy or right lumbosacral radiculopathy  Laboratory evaluations in September 2019, normal CBC, CMP showed elevated creatinine 1.4, negative HIV, UDS was positive for benzodiazepine and opiates.  Update February 17, 7988: He had umbilical hernia repair in April 2021, per hospital record, he had several episodes of post ictal status, had EEG by Dr. Hortense Bender on December 03, 2019, that was reported normal  Seen by neurologist Dr. Merlene Bender, document episode suspicious for pseudoseizure, but there was no detailed prescription of the event  Laboratory evaluation February 2021, lamotrigine level was 8.2  He also has the diagnosis of obstructive sleep apnea, more than 90% compliance per downloaded record  Update July 24, 2020 SS: Had a EMU admission in February 2021, EEG was normal, no seizures or epileptiform discharges seen.  One event was recorded without EEG change and was a nonepileptic event.  Lamictal level was 8.9 in July 2021.  Last seizure was in April after hernia surgery. Had another seizure 2-3  months ago outside mowing the lawn, legs shaking, then whole body, kept mowing. Wife came over sat down, then came around. On Lamictal XR 200 mg, 2 tablets at  bedtime. He doesn't drive.   Review of Bipap download, indicates excellent compliance, used 29/30 days, greater than 4 hours 29 days, 97%.  Average usage total days 7 hours 12 minutes.  Leak in the 95th percentile 3.5, AHI 2.5. Wife feels like mask is leaking at night, coming out the sides. Feels like falling asleep during the day. Doesn't take naps during the day. I left the room, he was asleep, woke up dramatically.  ESS was 21.  REVIEW OF SYSTEMS: Out of a complete 14 system review of symptoms, the patient complains only of the following symptoms, and all other reviewed systems are negative.  Daytime sleepiness, seizures  ALLERGIES: No Known Allergies  HOME MEDICATIONS: Outpatient Medications Prior to Visit  Medication Sig Dispense Refill  . albuterol (VENTOLIN HFA) 108 (90 Base) MCG/ACT inhaler Inhale 2 puffs into the lungs every 6 (six) hours as needed for wheezing or shortness of breath. 8 g 0  . LamoTRIgine 200 MG TB24 24 hour tablet Take 2 tablets (400 mg total) by mouth at bedtime. 180 tablet 4  . omeprazole (PRILOSEC) 40 MG capsule Take 40 mg by mouth daily.    Marland Kitchen oxyCODONE-acetaminophen (PERCOCET) 7.5-325 MG tablet Take 1 tablet by mouth every 6 (six) hours as needed. 25 tablet 0  . sertraline (ZOLOFT) 100 MG tablet Take 2 tablets (200 mg total) by mouth daily. 180 tablet 0   No facility-administered medications prior to visit.    PAST MEDICAL HISTORY: Past Medical History:  Diagnosis Date  . Anxiety   . Arthritis   . GERD (gastroesophageal reflux disease)   . History of seizures   . Prostate nodule    Benign  . Sleep apnea   . 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 FRACTURE SURGERY     LT arm, LT leg, Rt leg  . PROSTATE BIOPSY    . UMBILICAL HERNIA REPAIR N/A 12/03/2019   Procedure: HERNIA REPAIR UMBILICAL ADULT/ WITH MESH;  Surgeon: Aviva Signs, MD;  Location: [facility];  Service: General;   Laterality: N/A;  . URETHRAL STRICTURE DILATATION      FAMILY HISTORY: Family History  Problem Relation Age of Onset  . Hypertension Mother   . Diabetes Mother   . Other Father        died at age 73 in accident - log fell on him  . Kidney cancer Maternal Aunt   . Prostate cancer Paternal Uncle     SOCIAL HISTORY: Social History   Socioeconomic History  . Marital status: Married    Spouse name: Not on file  . Number of children: 2  . Years of education: 10th grade  . Highest education level: Not on file  Occupational History  . Occupation: tow Geophysical data processor  Tobacco Use  . Smoking status: Former Smoker    Packs/day: 0.50    Years: 10.00    Pack years: 5.00    Types: Cigarettes    Quit date: 12/01/2002    Years since quitting: 17.6  . Smokeless tobacco: Never Used  Vaping Use  . Vaping Use: Never used  Substance and Sexual Activity  . Alcohol use: Not Currently  . Drug use: Not Currently  . Sexual activity: Not on file  Other Topics Concern  .  Not on file  Social History Narrative   Lives at home with his wife and children.   Right-handed.   2 cups caffeine per day.   Social Determinants of Health   Financial Resource Strain: Not on file  Food Insecurity: Not on file  Transportation Needs: Not on file  Physical Activity: Not on file  Stress: Not on file  Social Connections: Not on file  Intimate Partner Violence: Not on file   PHYSICAL EXAM  Vitals:   07/24/20 1409  BP: 120/74  Pulse: 84  Weight: 277 lb 6.4 oz (125.8 kg)  Height: 6\' 1"  (1.854 m)   Body mass index is 36.6 kg/m.  Generalized: Well developed, in no acute distress   Neurological examination  Mentation: Alert oriented to time, place, history taking. Follows all commands speech and language fluent Cranial nerve II-XII: Pupils were equal round reactive to light. Extraocular movements were full, visual field were full on confrontational test. Facial sensation and strength were normal.  Head turning and shoulder shrug  were normal and symmetric. Motor: 5/5 strength of all extremities, exception weakness of the right lower extremity Sensory: Sensory testing is intact to soft touch on all 4 extremities. No evidence of extinction is noted.  Coordination: Cerebellar testing reveals good finger-nose-finger and heel-to-shin bilaterally.  Gait and station: Gait is wide-based, tends to drag the right leg, cautious Reflexes: Deep tendon reflexes are symmetric and normal bilaterally.   DIAGNOSTIC DATA (LABS, IMAGING, TESTING) - I reviewed patient records, labs, notes, testing and imaging myself where available.  Lab Results  Component Value Date   WBC 5.0 12/04/2019   HGB 14.0 12/04/2019   HCT 43.8 12/04/2019   MCV 88.7 12/04/2019   PLT 165 12/04/2019      Component Value Date/Time   NA 141 12/04/2019 0423   NA 140 06/10/2019 0818   K 4.5 12/04/2019 0423   CL 104 12/04/2019 0423   CO2 30 12/04/2019 0423   GLUCOSE 104 (H) 12/04/2019 0423   BUN 17 12/04/2019 0423   BUN 12 06/10/2019 0818   CREATININE 1.37 (H) 12/04/2019 0423   CALCIUM 8.5 (L) 12/04/2019 0423   PROT 6.3 (L) 10/04/2019 1114   PROT 6.6 06/10/2019 0818   ALBUMIN 4.2 10/04/2019 1114   ALBUMIN 4.7 06/10/2019 0818   AST 27 10/04/2019 1114   ALT 31 10/04/2019 1114   ALKPHOS 55 10/04/2019 1114   BILITOT 0.9 10/04/2019 1114   BILITOT 0.6 06/10/2019 0818   GFRNONAA >60 12/04/2019 0423   GFRAA >60 12/04/2019 0423   Lab Results  Component Value Date   TRIG 187 (H) 08/05/2019   No results found for: HGBA1C Lab Results  Component Value Date   VITAMINB12 351 06/10/2019   Lab Results  Component Value Date   TSH 2.430 06/10/2019   ASSESSMENT AND PLAN 49 y.o. year old male  has a past medical history of Anxiety, Arthritis, GERD (gastroesophageal reflux disease), History of seizures, Prostate nodule, Sleep apnea, and Urethral stricture in male. here with:  1.  Seizures -EMU admission in Feb, felt  nonepileptic events -1 seizure event 2-3 months ago, previously abnormal EEG but could have abnormal EEG never had epilepsy (per Dr. Hortense Bender note 10/04/19) -Dr. Hortense Bender recommended continuing Lamictal, if staring spells or semiology of episodes change, could consider another EEG evaluation -Continue Lamictal XR 200 mg, 2 tablets at bedtime -Lamictal level in July 2021 was 8.9 -He does not drive -Call for seizures, follow-up in 6 months or sooner if needed  2.  Gait abnormality -Extensive evaluation in the past -Normal MRI lumbar spine, EMG/NCV, felt to be functional component  3.  OSA on BiPAP -Download indicates excellent compliance -Wife thinks mask is leaking at night, will order mask refitting -Reported frequently falling asleep during the day, I saw this today, was quite dramatic, could be functional but will try Provigil 100 mg daily due to report, ESS was 21 -Keep follow-up appointment in April with Megan for OSA  I spent 30 minutes of face-to-face and non-face-to-face time with patient.  This included previsit chart review, lab review, study review, order entry, electronic health record documentation, patient education.  Butler Denmark, AGNP-C, DNP 07/24/2020, 2:44 PM [facility] [address] [place],  [address] [phone]  I reviewed the above note and documentation by the Nurse Practitioner and agree with the history, exam, assessment and plan as outlined above. I was available for consultation. Star Age, MD, PhD [facility])

## 2020-07-24 NOTE — Telephone Encounter (Signed)
Received a PA request for modafinil 100mg . PA was started via [email or web address]. Key is [id number].

## 2020-07-24 NOTE — Patient Instructions (Signed)
Try taking Provigil 100 mg in the morning for fatigue, daytime sleepiness Keep appointment in April for sleep  Call for seizures See you back in 6 months

## 2020-08-02 NOTE — Progress Notes (Deleted)
[facility] MD/PA/NP OP Progress Note  08/02/2020 4:49 PM Dale Bender  MRN:  [id number]  Chief Complaint:  HPI: *** Visit Diagnosis: No diagnosis found.  Past Psychiatric History: Please see initial evaluation for full details. I have reviewed the history. No updates at this time.     Past Medical History:  Past Medical History:  Diagnosis Date  . Anxiety   . Arthritis   . GERD (gastroesophageal reflux disease)   . History of seizures   . Prostate nodule    Benign  . Sleep apnea   . 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 FRACTURE SURGERY     LT arm, LT leg, Rt leg  . PROSTATE BIOPSY    . UMBILICAL HERNIA REPAIR N/A 12/03/2019   Procedure: HERNIA REPAIR UMBILICAL ADULT/ WITH MESH;  Surgeon: Aviva Signs, MD;  Location: [facility];  Service: General;  Laterality: N/A;  . URETHRAL STRICTURE DILATATION      Family Psychiatric History: Please see initial evaluation for full details. I have reviewed the history. No updates at this time.     Family History:  Family History  Problem Relation Age of Onset  . Hypertension Mother   . Diabetes Mother   . Other Father        died at age 73 in accident - log fell on him  . Kidney cancer Maternal Aunt   . Prostate cancer Paternal Uncle     Social History:  Social History   Socioeconomic History  . Marital status: Married    Spouse name: Not on file  . Number of children: 2  . Years of education: 10th grade  . Highest education level: Not on file  Occupational History  . Occupation: tow Geophysical data processor  Tobacco Use  . Smoking status: Former Smoker    Packs/day: 0.50    Years: 10.00    Pack years: 5.00    Types: Cigarettes    Quit date: 12/01/2002    Years since quitting: 17.6  . Smokeless tobacco: Never Used  Vaping Use  . Vaping Use: Never used  Substance and Sexual Activity  . Alcohol use: Not Currently  . Drug use: Not Currently  . Sexual activity: Not on file  Other  Topics Concern  . Not on file  Social History Narrative   Lives at home with his wife and children.   Right-handed.   2 cups caffeine per day.   Social Determinants of Health   Financial Resource Strain: Not on file  Food Insecurity: Not on file  Transportation Needs: Not on file  Physical Activity: Not on file  Stress: Not on file  Social Connections: Not on file    Allergies: No Known Allergies  Metabolic Disorder Labs: No results found for: HGBA1C, MPG No results found for: PROLACTIN Lab Results  Component Value Date   TRIG 187 (H) 08/05/2019   Lab Results  Component Value Date   TSH 2.430 06/10/2019   TSH 1.87 06/01/2019    Therapeutic Level Labs: No results found for: LITHIUM No results found for: VALPROATE No components found for:  CBMZ  Current Medications: Current Outpatient Medications  Medication Sig Dispense Refill  . albuterol (VENTOLIN HFA) 108 (90 Base) MCG/ACT inhaler Inhale 2 puffs into the lungs every 6 (six) hours as needed for wheezing or shortness of breath. 8 g 0  . LamoTRIgine 200 MG TB24 24 hour tablet Take 2 tablets (400 mg total)  by mouth at bedtime. 180 tablet 4  . modafinil (PROVIGIL) 100 MG tablet Take 1 tablet (100 mg total) by mouth daily. 30 tablet 5  . omeprazole (PRILOSEC) 40 MG capsule Take 40 mg by mouth daily.    Marland Kitchen oxyCODONE-acetaminophen (PERCOCET) 7.5-325 MG tablet Take 1 tablet by mouth every 6 (six) hours as needed. 25 tablet 0  . sertraline (ZOLOFT) 100 MG tablet Take 2 tablets (200 mg total) by mouth daily. 180 tablet 0   No current facility-administered medications for this visit.     Musculoskeletal: Strength & Muscle Tone: N/A Gait & Station: N/A Patient leans: N/A  Psychiatric Specialty Exam: Review of Systems  There were no vitals taken for this visit.There is no height or weight on file to calculate BMI.  General Appearance: {Appearance:22683}  Eye Contact:  {BHH EYE CONTACT:22684}  Speech:  Clear and  Coherent  Volume:  Normal  Mood:  {BHH MOOD:22306}  Affect:  {Affect (PAA):22687}  Thought Process:  Coherent  Orientation:  Full (Time, Place, and Person)  Thought Content: Logical   Suicidal Thoughts:  {ST/HT (PAA):22692}  Homicidal Thoughts:  {ST/HT (PAA):22692}  Memory:  Immediate;   Good  Judgement:  {Judgement (PAA):22694}  Insight:  {Insight (PAA):22695}  Psychomotor Activity:  Normal  Concentration:  Concentration: Good and Attention Span: Good  Recall:  Good  Fund of Knowledge: Good  Language: Good  Akathisia:  No  Handed:  Right  AIMS (if indicated): not done  Assets:  Communication Skills Desire for Improvement  ADL's:  Intact  Cognition: WNL  Sleep:  {BHH GOOD/FAIR/POOR:22877}   Screenings:   Assessment and Plan:  Dale Bender is a 49 y.o. year old male with a history of  depression, complex partial seizure, who presents for follow up appointment for below.    1. Current mild episode of major depressive disorder without prior episode ([place]) He reports occasionally depressed mood with passive SI in the context of demoralization from his physical condition, which includes seizure, visual changes, and right leg weakness.  Will uptitrate sertraline to optimize treatment for depression.   Plan 1 .Increasesertraline 200 mg daily  2.Next appointment:1/5 at 9:10 for 20 mins, video 3.ReviewedTSH; wnl  The patient demonstrates the following risk factors for suicide: Chronic risk factors for suicide include:psychiatric disorder ofdepression. Acute risk factorsfor suicide include: loss (financial, interpersonal, professional). Protective factorsfor this patient include: positive social support, responsibility to others (children, family), coping skills and hope for the future. Considering these factors, the overall suicide risk at this point appears to below. Patientisappropriate for outpatient follow up.       [name], MD 08/02/2020, 4:49  PM

## 2020-08-03 ENCOUNTER — Encounter: Payer: Self-pay | Admitting: Adult Health

## 2020-08-07 ENCOUNTER — Telehealth: Payer: Self-pay | Admitting: Neurology

## 2020-08-07 NOTE — Telephone Encounter (Signed)
Reviewed plan with Dr. Rexene Alberts, please call patient and find out of Provigil has been helpful. I don't think this is a long term solution for his drowsiness, he may require further testing for evaluation of his drowsiness. I would like to stop the Provigil especially if not helpful.

## 2020-08-08 NOTE — Telephone Encounter (Signed)
The challenge with evaluation for residual sleepiness with extended sleep testing would be that he is on medication that can contribute to sleepiness which includes sertraline, lamotrigine and narcotic pain medication.  In order to proceed with a nighttime sleep study followed by a nap study, he would have to be off of sedating medications.   At the next sleep follow-up, we can discuss if he can taper at least the sertraline and his narcotic pain medication. I am assuming, that he cannot come off of his lamotrigine at this time, but I will let Dr. Terrace Arabia decide.   We can then proceed with a nocturnal sleep study with next day nap testing if patient is agreeable to this.

## 2020-08-08 NOTE — Telephone Encounter (Signed)
I called pt and spoke to wife.  He was nt able to get as the insurance would not approve so he never started on it.

## 2020-08-08 NOTE — Telephone Encounter (Signed)
LMVM for pt to return call re: response to medication.

## 2020-08-08 NOTE — Addendum Note (Signed)
Addended by: Glean Salvo on: 08/08/2020 03:08 PM   Modules accepted: Orders

## 2020-08-08 NOTE — Telephone Encounter (Signed)
Have d/c provigil, you may need to let the pharmacy know. Please see Dr. Frances Furbish note, will address at next follow-up visit.

## 2020-08-09 NOTE — Telephone Encounter (Signed)
Called and LMVM for Dale Bender drug to cancel the prescription for modafinil on pt per SS/NP.  They are to call back if questions.

## 2020-08-09 NOTE — Telephone Encounter (Signed)
Called pt and LMVM that his sleepiness will be addressed at next sleep f/u (getting sleep study) more specifics when in office for appt.

## 2020-08-16 ENCOUNTER — Encounter: Payer: Medicaid Other | Admitting: Psychiatry

## 2020-08-16 ENCOUNTER — Telehealth: Payer: Self-pay | Admitting: Psychiatry

## 2020-08-16 ENCOUNTER — Other Ambulatory Visit: Payer: Self-pay

## 2020-08-16 NOTE — Telephone Encounter (Signed)
Contacted the patient for appointment this morning. He states that he wants to reschedule as he has another appointment to get to. He verbalized understanding that this would be counted as no show. The appointment will be rescheduled.

## 2020-08-17 NOTE — Progress Notes (Signed)
This encounter was created in error - please disregard.

## 2020-08-17 NOTE — Progress Notes (Signed)
Virtual Visit via Video Note  I connected with Dale Bender on 08/21/20 at  3:30 PM EST by a video enabled telemedicine application and verified that I am speaking with the correct person using two identifiers.  Location: Patient: home Provider: office Persons participated in the visit- patient, provider   I discussed the limitations of evaluation and management by telemedicine and the availability of in person appointments. The patient expressed understanding and agreed to proceed.   I discussed the assessment and treatment plan with the patient. The patient was provided an opportunity to ask questions and all were answered. The patient agreed with the plan and demonstrated an understanding of the instructions.   The patient was advised to call back or seek an in-person evaluation if the symptoms worsen or if the condition fails to improve as anticipated.  I provided 15 minutes of non-face-to-face time during this encounter.   [name], MD    [facility] MD/PA/NP OP Progress Note  08/21/2020 3:53 PM Dale Bender  MRN:  [id number]  Chief Complaint:  Chief Complaint    Depression; Follow-up     HPI:  This is a follow-up appointment for depression.  He states that he has been doing good.  He had a good holiday with his family, and people at church.  He continues to work as Therapist, sports.  He enjoys playing with his son, and going outside at times.  He states that "everything is getting back " to how he used to be doing.  However, he occasionally feels down, thinking that he should have been working.  He has hypersomnia, and sleep s 3 hours during the day. He feels fatigue at times.  Although he was offered some medication by his sleep provider, it was not covered by his insurance.  He has fair concentration.  He gained several pounds, which he attributes to eating more during holidays.  He denies anhedonia.  He denies SI.   Daily routine: stays in the house most of the time, goes to  church on Fridays, Saturdays Employment: on long term disability from his job, (currently waiting for decision from the state). He used to works at [employer] for 13 years Support: [place]: wife, his children Marital status: married  Number of children: 34 (23, 23 year old) He feels "mistreated" in his childhood by his parents. He describes it as "not like average family grouping up" as they did not show love. He later reports good relationship with his father, who deceased at age 11 many years ago. His mother is in [place]. He has estranged relationship with his brother, sister while he "don't remember" why it happened  Wt Readings from Last 3 Encounters:  07/24/20 277 lb 6.4 oz (125.8 kg)  07/03/20 276 lb (125.2 kg)  02/17/20 270 lb (122.5 kg)    Visit Diagnosis:    ICD-10-CM   1. MDD (major depressive disorder), recurrent, in partial remission (HCC)  F33.41 sertraline (ZOLOFT) 100 MG tablet    Past Psychiatric History: Please see initial evaluation for full details. I have reviewed the history. No updates at this time.     Past Medical History:  Past Medical History:  Diagnosis Date  . Anxiety   . Arthritis   . GERD (gastroesophageal reflux disease)   . History of seizures   . Prostate nodule    Benign  . Sleep apnea   . 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  FRACTURE SURGERY     LT arm, LT leg, Rt leg  . PROSTATE BIOPSY    . UMBILICAL HERNIA REPAIR N/A 12/03/2019   Procedure: HERNIA REPAIR UMBILICAL ADULT/ WITH MESH;  Surgeon: Franky Macho, MD;  Location: [facility];  Service: General;  Laterality: N/A;  . URETHRAL STRICTURE DILATATION      Family Psychiatric History: Please see initial evaluation for full details. I have reviewed the history. No updates at this time.     Family History:  Family History  Problem Relation Age of Onset  . Hypertension Mother   . Diabetes Mother   . Other Father        died at age 30  in accident - log fell on him  . Kidney cancer Maternal Aunt   . Prostate cancer Paternal Uncle     Social History:  Social History   Socioeconomic History  . Marital status: Married    Spouse name: Not on file  . Number of children: 2  . Years of education: 10th grade  . Highest education level: Not on file  Occupational History  . Occupation: tow Industrial/product designer  Tobacco Use  . Smoking status: Former Smoker    Packs/day: 0.50    Years: 10.00    Pack years: 5.00    Types: Cigarettes    Quit date: 12/01/2002    Years since quitting: 17.7  . Smokeless tobacco: Never Used  Vaping Use  . Vaping Use: Never used  Substance and Sexual Activity  . Alcohol use: Not Currently  . Drug use: Not Currently  . Sexual activity: Not on file  Other Topics Concern  . Not on file  Social History Narrative   Lives at home with his wife and children.   Right-handed.   2 cups caffeine per day.   Social Determinants of Health   Financial Resource Strain: Not on file  Food Insecurity: Not on file  Transportation Needs: Not on file  Physical Activity: Not on file  Stress: Not on file  Social Connections: Not on file    Allergies: No Known Allergies  Metabolic Disorder Labs: No results found for: HGBA1C, MPG No results found for: PROLACTIN Lab Results  Component Value Date   TRIG 187 (H) 08/05/2019   Lab Results  Component Value Date   TSH 2.430 06/10/2019   TSH 1.87 06/01/2019    Therapeutic Level Labs: No results found for: LITHIUM No results found for: VALPROATE No components found for:  CBMZ  Current Medications: Current Outpatient Medications  Medication Sig Dispense Refill  . albuterol (VENTOLIN HFA) 108 (90 Base) MCG/ACT inhaler Inhale 2 puffs into the lungs every 6 (six) hours as needed for wheezing or shortness of breath. 8 g 0  . LamoTRIgine 200 MG TB24 24 hour tablet Take 2 tablets (400 mg total) by mouth at bedtime. 180 tablet 4  . omeprazole (PRILOSEC) 40 MG  capsule Take 40 mg by mouth daily.    Marland Kitchen oxyCODONE-acetaminophen (PERCOCET) 7.5-325 MG tablet Take 1 tablet by mouth every 6 (six) hours as needed. 25 tablet 0  . [START ON 09/14/2020] sertraline (ZOLOFT) 100 MG tablet Take 2 tablets (200 mg total) by mouth daily. 180 tablet 0   No current facility-administered medications for this visit.     Musculoskeletal: Strength & Muscle Tone: N/A Gait & Station: N/A Patient leans: N/A  Psychiatric Specialty Exam: Review of Systems  Psychiatric/Behavioral: Positive for dysphoric mood. Negative for agitation, behavioral problems, confusion, decreased concentration, hallucinations, self-injury,  sleep disturbance and suicidal ideas. The patient is not nervous/anxious and is not hyperactive.   All other systems reviewed and are negative.   There were no vitals taken for this visit.There is no height or weight on file to calculate BMI.  General Appearance: Fairly Groomed  Eye Contact:  Good  Speech:  Clear and Coherent  Volume:  Normal  Mood:  good  Affect:  Appropriate, Congruent and less down  Thought Process:  Coherent  Orientation:  Full (Time, Place, and Person)  Thought Content: Logical   Suicidal Thoughts:  No  Homicidal Thoughts:  No  Memory:  Immediate;   Good  Judgement:  Good  Insight:  Good  Psychomotor Activity:  Normal  Concentration:  Concentration: Good and Attention Span: Good  Recall:  Good  Fund of Knowledge: Good  Language: Good  Akathisia:  No  Handed:  Right  AIMS (if indicated): not done  Assets:  Communication Skills Desire for Improvement  ADL's:  Intact  Cognition: WNL  Sleep:  Good   Screenings:   Assessment and Plan:  Dale Bender is a 50 y.o. year old male with a history of depression, complex partial seizure, obstructive sleep apnea, who presents for follow up appointment for below.   1. MDD (major depressive disorder), recurrent, in partial remission ([place]) He reports overall improvement in his  depressive symptoms since the titration of sertraline.  Psychosocial stressors include unemployment, demoralization secondary to physical condition, which includes seizure, visual changes, and right leg weakness.  We will continue current dose of sertraline to target depression.  He will greatly benefit from CBT/supportive therapy; will make a referral.   Plan 1 Continue sertraline 200 mg at night   2.Next appointment:3/21 at 9:20 for 20 mins, video 3.ReviewedTSH; wnl - Referral to therapy   The patient demonstrates the following risk factors for suicide: Chronic risk factors for suicide include:psychiatric disorder ofdepression. Acute risk factorsfor suicide include: loss (financial, interpersonal, professional). Protective factorsfor this patient include: positive social support, responsibility to others (children, family), coping skills and hope for the future. Considering these factors, the overall suicide risk at this point appears to below. Patientisappropriate for outpatient follow up.  [name], MD 08/21/2020, 3:53 PM

## 2020-08-21 ENCOUNTER — Telehealth (INDEPENDENT_AMBULATORY_CARE_PROVIDER_SITE_OTHER): Payer: Medicaid Other | Admitting: Psychiatry

## 2020-08-21 ENCOUNTER — Other Ambulatory Visit: Payer: Self-pay

## 2020-08-21 ENCOUNTER — Encounter: Payer: Self-pay | Admitting: Psychiatry

## 2020-08-21 DIAGNOSIS — F3341 Major depressive disorder, recurrent, in partial remission: Secondary | ICD-10-CM | POA: Diagnosis not present

## 2020-08-21 MED ORDER — SERTRALINE HCL 100 MG PO TABS: 200.0000 mg | ORAL_TABLET | Freq: Every day | ORAL | 0 refills | Status: DC

## 2020-08-21 NOTE — Patient Instructions (Signed)
1 Continue sertraline 200 mg at night   2.Next appointment:3/21 at 9:20

## 2020-09-01 ENCOUNTER — Other Ambulatory Visit: Payer: Self-pay

## 2020-09-01 ENCOUNTER — Ambulatory Visit (INDEPENDENT_AMBULATORY_CARE_PROVIDER_SITE_OTHER): Payer: Medicaid Other | Admitting: Clinical

## 2020-09-01 DIAGNOSIS — F419 Anxiety disorder, unspecified: Secondary | ICD-10-CM | POA: Diagnosis not present

## 2020-09-01 DIAGNOSIS — F331 Major depressive disorder, recurrent, moderate: Secondary | ICD-10-CM

## 2020-09-01 NOTE — Progress Notes (Signed)
Virtual Visit via Video Note  I connected with Dale Bender on 09/01/20 at 11:00 AM EST by a video enabled telemedicine application and verified that I am speaking with the correct person using two identifiers.  Location: Patient: Home Provider: Office   I discussed the limitations of evaluation and management by telemedicine and the availability of in person appointments. The patient expressed understanding and agreed to proceed.    Comprehensive Clinical Assessment (CCA) Note  09/01/2020 Dale Bender [id number]  Chief Complaint: Depression with Anxiety Visit Diagnosis: Depression with Anxiety   CCA Screening, Triage and Referral (STR)  Patient Reported Information How did you hear about [name]? No data recorded Referral name: No data recorded Referral phone number: No data recorded  Whom do you see for routine medical problems? No data recorded Practice/Facility Name: No data recorded Practice/Facility Phone Number: No data recorded Name of Contact: No data recorded Contact Number: No data recorded Contact Fax Number: No data recorded Prescriber Name: No data recorded Prescriber Address (if known): No data recorded  What Is the Reason for Your Visit/Call Today? No data recorded How Long Has This Been Causing You Problems? No data recorded What Do You Feel Would Help You the Most Today? No data recorded  Have You Recently Been in Any Inpatient Treatment (Hospital/Detox/Crisis Center/28-Day Program)? No data recorded Name/Location of Program/Hospital:No data recorded How Long Were You There? No data recorded When Were You Discharged? No data recorded  Have You Ever Received Services From [facility] Before? No data recorded Who Do You See at [facility]? No data recorded  Have You Recently Had Any Thoughts About Hurting Yourself? No data recorded Are You Planning to Commit Suicide/Harm Yourself At This time? No data recorded  Have you Recently Had Thoughts About  [place]? No data recorded Explanation: No data recorded  Have You Used Any Alcohol or Drugs in the Past 24 Hours? No data recorded How Long Ago Did You Use Drugs or Alcohol? No data recorded What Did You Use and How Much? No data recorded  Do You Currently Have a Therapist/Psychiatrist? No data recorded Name of Therapist/Psychiatrist: No data recorded  Have You Been Recently Discharged From Any Office Practice or Programs? No data recorded Explanation of Discharge From Practice/Program: No data recorded    CCA Screening Triage Referral Assessment Type of Contact: No data recorded Is this Initial or Reassessment? No data recorded Date Telepsych consult ordered in [facility]:  No data recorded Time Telepsych consult ordered in [facility]:  No data recorded  Patient Reported Information Reviewed? No data recorded Patient Left Without Being Seen? No data recorded Reason for Not Completing Assessment: No data recorded  Collateral Involvement: No data recorded  Does Patient Have a [place]? No data recorded Name and Contact of Legal Guardian: No data recorded If Minor and Not Living with Parent(s), Who has Custody? No data recorded Is CPS involved or ever been involved? No data recorded Is APS involved or ever been involved? No data recorded  Patient Determined To Be At Risk for Harm To Self or Others Based on Review of Patient Reported Information or Presenting Complaint? No data recorded Method: No data recorded Availability of Means: No data recorded Intent: No data recorded Notification Required: No data recorded Additional Information for Danger to Others Potential: No data recorded Additional Comments for Danger to Others Potential: No data recorded Are There Guns or Other Weapons in Your Home? No data recorded Types of Guns/Weapons:  No data recorded Are These Weapons Safely Secured?                            No data recorded Who Could Verify You Are  Able To Have These Secured: No data recorded Do You Have any Outstanding Charges, Pending Court Dates, Parole/Probation? No data recorded Contacted To Inform of Risk of Harm To Self or Others: No data recorded  Location of Assessment: No data recorded  Does Patient Present under Involuntary Commitment? No data recorded IVC Papers Initial File Date: No data recorded  [place] of Residence: No data recorded  Patient Currently Receiving the Following Services: No data recorded  Determination of Need: No data recorded  Options For Referral: No data recorded    CCA Biopsychosocial Intake/Chief Complaint:  Mood and anxiety  Current Symptoms/Problems: Mood: sleep all the time, irritable, don't feel like doing much, low energy, some difficulty with concentration, tearfulness,    Anxiety: worries, panic attack,  physical health issues   Patient Reported Schizophrenia/Schizoaffective Diagnosis in Past: No   Strengths: Family focused, wants to get better, church  Preferences: Prefer that health issues didn't occur, prefers to be with family, prefers to go to church  Abilities: drive a tow motor   Type of Services Patient Feels are Needed: Therapy, medication   Initial Clinical Notes/Concerns: Symptoms started when he was 47 after a proceduce, symptoms occur daily, symptoms are mild   Mental Health Symptoms Depression:  Irritability; Tearfulness; Sleep (too much or little); Change in energy/activity; Difficulty Concentrating; Hopelessness   Duration of Depressive symptoms: Greater than two weeks   Mania:  N/A   Anxiety:   Difficulty concentrating; Irritability; Tension; Worrying; Sleep; Restlessness   Psychosis:  None   Duration of Psychotic symptoms: No data recorded  Trauma:  N/A   Obsessions:  N/A   Compulsions:  N/A   Inattention:  N/A   Hyperactivity/Impulsivity:  N/A   Oppositional/Defiant Behaviors:  N/A   Emotional Irregularity:  N/A   Other  Mood/Personality Symptoms:  N/A    Mental Status Exam Appearance and self-care  Stature:  Tall   Weight:  Overweight   Clothing:  Casual   Grooming:  Normal   Cosmetic use:  None   Posture/gait:  Normal   Motor activity:  Not Remarkable   Sensorium  Attention:  Normal   Concentration:  Normal   Orientation:  X5   Recall/memory:  Defective in Short-term   Affect and Mood  Affect:  Depressed   Mood:  Depressed   Relating  Eye contact:  Normal   Facial expression:  Depressed   Attitude toward examiner:  Cooperative   Thought and Language  Speech flow: Normal   Thought content:  Appropriate to Mood and Circumstances   Preoccupation:  None (N/A)   Hallucinations:  None (N/A)   Organization:  Logical  Transport planner of Knowledge:  Average   Intelligence:  Above Average   Abstraction:  Normal   Judgement:  Normal   Reality Testing:  Adequate   Insight:  Good   Decision Making:  Normal   Social Functioning  Social Maturity:  Responsible   Social Judgement:  Normal   Stress  Stressors:  Illness; Family conflict   Coping Ability:  Overwhelmed   Skill Deficits:  None noted  Supports:  Church; Family; Friends/Service system     Religion: Religion/Spirituality Are You A Religious Person?: Yes How Might This Affect  Treatment?: Support  Leisure/Recreation: Leisure / Recreation Do You Have Hobbies?: Yes Leisure and Hobbies: spending time with kids, working through [employer] and currently going to [employer]  Exercise/Diet: Exercise/Diet Do You Exercise?: Yes What Type of Exercise Do You Do?: Run/Walk How Many Times a Week Do You Exercise?: 1-3 times a week Have You Gained or Lost A Significant Amount of Weight in the Past Six Months?: Yes-Gained Number of Pounds Gained: 30 Do You Follow a Special Diet?: No Do You Have Any Trouble Sleeping?: Yes Explanation of Sleeping Difficulties: Difficulty staying asleep   CCA  Employment/Education Employment/Work Situation: Employment / Work Situation Employment situation: On disability Why is patient on disability: The patient was placed on disability for his medical problems How long has patient been on disability: 70yrs Patient's job has been impacted by current illness: No 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 Has patient ever been in the [employer]?: No  Education: Education Is Patient Currently Attending School?: No Last Grade Completed: 9 Name of [place]: [place] Did Teacher, adult education From [employer]?: No Did [place]?: No Did Heritage manager?: No Did You Have Any Special Interests In School?: None identified Did You Have An Individualized Education Program (IIEP): Yes (Was in Learning Disabled classes) Did You Have Any Difficulty At School?: Yes Were Any Medications Ever Prescribed For These Difficulties?: No Patient's Education Has Been Impacted by Current Illness: No   CCA Family/Childhood History Family and Relationship History: Family history Marital status: Married Number of Years Married: 59 What types of issues is patient dealing with in the relationship?: None Additional relationship information: No Additional Are you sexually active?: No What is your sexual orientation?: Heterosexual Has your sexual activity been affected by drugs, alcohol, medication, or emotional stress?: Medical issue Does patient have children?: Yes How many children?: 2 How is patient's relationship with their children?: The patient notes, " I have a good relationship with my kids".  Childhood History:  Childhood History By whom was/is the patient raised?: Mother,Father Additional childhood history information: Patient describes childhood as " good and bad." Dad was present some times Description of patient's relationship with caregiver when they  were a child: Mother: Good   Father: Good Patient's description of current relationship with people who raised him/her: The patients Father is Deceased     Mother- good relationship she lives in [place] How were you disciplined when you got in trouble as a child/adolescent?: Spanking Does patient have siblings?: Yes Number of Siblings: 5 Description of patient's current relationship with siblings: 1 sister and 4 brothers. The patient notes he does not get along with his siblings Did patient suffer any verbal/emotional/physical/sexual abuse as a child?: No Did patient suffer from severe childhood neglect?: No Has patient ever been sexually abused/assaulted/raped as an adolescent or adult?: No Was the patient ever a victim of a crime or a disaster?: No Witnessed domestic violence?: No Has patient been affected by domestic violence as an adult?: No  Child/Adolescent Assessment:     CCA Substance Use Alcohol/Drug Use: Alcohol / Drug Use Pain Medications: See MAR Prescriptions: See MAR Over the Counter: Ibprofin, History of alcohol / drug use?: No history of alcohol / drug abuse Longest period of sobriety (when/how long): NA  ASAM's:  Six Dimensions of Multidimensional Assessment  Dimension 1:  Acute Intoxication and/or Withdrawal Potential:      Dimension 2:  Biomedical Conditions and Complications:      Dimension 3:  Emotional, Behavioral, or Cognitive Conditions and Complications:     Dimension 4:  Readiness to Change:     Dimension 5:  Relapse, Continued use, or Continued Problem Potential:     Dimension 6:  Recovery/Living Environment:     ASAM Severity Score:    ASAM Recommended Level of Treatment:     Substance use Disorder (SUD)    Recommendations for Services/Supports/Treatments: Recommendations for Services/Supports/Treatments Recommendations For Services/Supports/Treatments: Medication Management,Individual Therapy  DSM5  Diagnoses: Patient Active Problem List   Diagnosis Date Noted  . Seizures ([place]) 12/03/2019  . Umbilical hernia without obstruction and without gangrene   . Convulsion ([place]) 10/04/2019  . OSA (obstructive sleep apnea) 09/16/2019  . Seizure disorder ([place]) 09/16/2019  . Major depressive disorder with single episode, in full remission ([place]) 08/30/2019  . Seizure ([place]) 08/05/2019  . COVID-19 virus detected 08/05/2019  . Acute respiratory distress 08/05/2019  . AKI (acute kidney injury) ([place]) 08/05/2019  . Dehydration 08/05/2019  . Epilepsy ([place]) 06/10/2019  . Right leg numbness 06/10/2019  . 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[place]) 04/13/2019  . Seizure-like activity ([place]) 07/22/2018  . Right leg weakness 07/22/2018  . Gait abnormality 07/22/2018  . GERD (gastroesophageal reflux disease) 07/04/2018  . Hematuria 07/04/2018  . 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 The patient was provided an opportunity to ask questions and all were answered. The patient agreed with the plan and demonstrated an understanding of the instructions.   The patient was advised to call back or seek an in-person evaluation if the symptoms worsen or if the condition fails to improve as anticipated.  I provided 60 minutes of non-face-to-face time during this encounter.  [name], LCSW   09/01/2020

## 2020-09-14 NOTE — Progress Notes (Signed)
I have reviewed and agreed above plan. 

## 2020-09-20 ENCOUNTER — Telehealth: Payer: Self-pay | Admitting: Neurology

## 2020-09-20 NOTE — Telephone Encounter (Signed)
Spoke with pt's wife over the phone regarding payment for his "[employer] [place] [employer]" Disability form to be filled out. Pt's wife stated he would call back to pay.

## 2020-09-28 ENCOUNTER — Ambulatory Visit (INDEPENDENT_AMBULATORY_CARE_PROVIDER_SITE_OTHER): Payer: Medicaid Other | Admitting: Clinical

## 2020-09-28 ENCOUNTER — Other Ambulatory Visit: Payer: Self-pay

## 2020-09-28 DIAGNOSIS — F331 Major depressive disorder, recurrent, moderate: Secondary | ICD-10-CM | POA: Diagnosis not present

## 2020-09-28 DIAGNOSIS — F419 Anxiety disorder, unspecified: Secondary | ICD-10-CM

## 2020-09-28 NOTE — Progress Notes (Addendum)
  Virtual Visit via Telephone Note  I connected with Dale Bender on 09/28/20 at 10:00 AM EST by telephone and verified that I am speaking with the correct person using two identifiers.  Location: Patient: Home Provider: Office   I discussed the limitations, risks, security and privacy concerns of performing an evaluation and management service by telephone and the availability of in person appointments. I also discussed with the patient that there may be a patient responsible charge related to this service. The patient expressed understanding and agreed to proceed.   THERAPIST PROGRESS NOTE  Session Time:10:00AM-10:30AM  Participation Level:Active  Behavioral Response:CasualAlertIrratible  Type of Therapy:Individual Therapy  Treatment Goals addressed:Coping  Interventions:CBT, Solution Focused, Strength-based, Supportive and Social Skills Training  Summary:Dale J. Hamptonis a49y.o.malewho presents with Depression and Anxiety.The OPT therapist worked with thepatientfor his intialtreatment. The OPT therapist utilized Motivational Interviewing to assist in creating therapeutic repore. The patient in the session was engaged and work in collaboration giving feedback about his triggers and symptoms over the past few weeksincludinghealth concerns with kidneys.The OPT therapist utilized Cognitive Behavioral Therapy through cognitive restructuring as well as worked with the patient on coping strategies to assist in management ofmood, improve coping, and improve positive thinking while empowering the patientto be active and not self isolate.The OPT therapist examined upcoming health appointments and continued to encourage the patient to stay consistent and compliant with all medical appointments and recommendations.The patient is currently responding well to his medication therapy.  Suicidal/Homicidal:Nowithout intent/plan  Therapist Response:The OPT  therapist worked with the patient for the patients scheduled session. The patient was engaged in his session and gave feedback in relation to triggers, symptoms, and behavior responses over the past fewweeks. The OPT therapist worked with the patient utilizing an in session Cognitive Behavioral Therapy exercise. The patient was responsive in the session andverbalized, " I have been getting out and going to church".The patienthas been doing well with his interaction with others.The OPT therapist reviewed  interactions within the home setting and the patients response to medication therapy.The OPT therapist will continue treatment work with the patient in his next scheduled session.   Plan: Return again in2/3weeks.  Diagnosis:Axis I:Recurrent Depression with Anxiety Axis II:No diagnosis  I discussed the assessment and treatment plan with the patient. The patient was provided an opportunity to ask questions and all were answered. The patient agreed with the plan and demonstrated an understanding of the instructions.  The patient was advised to call back or seek an in-person evaluation if the symptoms worsen or if the condition fails to improve as anticipated.  I provided74minutes of non-face-to-face time during this encounter.  [name], LCSW 09/28/2020

## 2020-10-12 DIAGNOSIS — Z0289 Encounter for other administrative examinations: Secondary | ICD-10-CM

## 2020-10-19 ENCOUNTER — Telehealth: Payer: Self-pay | Admitting: *Deleted

## 2020-10-19 NOTE — Telephone Encounter (Signed)
[place] form faxed on 10/19/2020 [phone]
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
## 2020-10-24 ENCOUNTER — Ambulatory Visit (INDEPENDENT_AMBULATORY_CARE_PROVIDER_SITE_OTHER): Payer: Medicaid Other | Admitting: Clinical

## 2020-10-24 ENCOUNTER — Other Ambulatory Visit: Payer: Self-pay

## 2020-10-24 DIAGNOSIS — F331 Major depressive disorder, recurrent, moderate: Secondary | ICD-10-CM | POA: Diagnosis not present

## 2020-10-24 DIAGNOSIS — F419 Anxiety disorder, unspecified: Secondary | ICD-10-CM

## 2020-10-24 NOTE — Progress Notes (Signed)
Virtual Visit via Video Note  I connected with Dale Bender on 10/24/20 at  9:00 AM EDT by a video enabled telemedicine application and verified that I am speaking with the correct person using two identifiers.  Location: Patient: Home Provider: Office   I discussed the limitations of evaluation and management by telemedicine and the availability of in person appointments. The patient expressed understanding and agreed to proceed.   THERAPIST PROGRESS NOTE  Session Time:9:00AM-9:30AM  Participation Level:Active  Behavioral Response:CasualAlertIrratible  Type of Therapy:Individual Therapy  Treatment Goals addressed:Coping  Interventions:CBT, Solution Focused, Strength-based, Supportive and Social Skills Training  Summary:Dale J. Hamptonis a49y.o.malewho presents with Depression and Anxiety.The OPT therapist worked with thepatientfor his ongoing treatment. The OPT therapist utilized Motivational Interviewing to assist in creating therapeutic repore. The patient in the session was engaged and work in collaboration giving feedback about his triggers and symptoms over the past few weeksincludinghealth concerns with side pain he will be doing additional health testing next Thursday to learn more.The OPT therapist utilized Cognitive Behavioral Therapy through cognitive restructuring as well as worked with the patient on coping strategies to assist in management ofmood, improve coping, and improve positive thinking while empowering the patientto be active and not self isolate.The OPT therapist examined upcoming health appointments and continued to encourage the patient to stay consistent and compliant with all medical appointments and recommendations.The patient in this session spoke more about his interactions with extended familyThe patient is currently responding well to his medication therapy.  Suicidal/Homicidal:Nowithout intent/plan  Therapist  Response:The OPT therapist worked with the patient for the patients scheduled session. The patient was engaged in his session and gave feedback in relation to triggers, symptoms, and behavior responses over the past fewweeks. The OPT therapist worked with the patient utilizing an in session Cognitive Behavioral Therapy exercise. The patient was responsive in the session andverbalized, " Right now I am focused on figuring out my side pain. I am in pain everyday".The patienthas been doing well with his interaction with others.The OPT therapist reviewed interactions within the home setting and the patients response to medication therapy.The OPT therapist will continue treatment work with the patient in his next scheduled session.   Plan: Return again in2/3weeks.  Diagnosis:Axis I:Recurrent Depression with Anxiety Axis II:No diagnosis  I discussed the assessment and treatment plan with the patient. The patient was provided an opportunity to ask questions and all were answered. The patient agreed with the plan and demonstrated an understanding of the instructions.  The patient was advised to call back or seek an in-person evaluation if the symptoms worsen or if the condition fails to improve as anticipated.  I provided48minutes of non-face-to-face time during this encounter.  [name], LCSW  10/24/2020

## 2020-10-30 ENCOUNTER — Other Ambulatory Visit: Payer: Self-pay

## 2020-10-30 ENCOUNTER — Telehealth (INDEPENDENT_AMBULATORY_CARE_PROVIDER_SITE_OTHER): Payer: Medicaid Other | Admitting: Psychiatry

## 2020-10-30 ENCOUNTER — Encounter: Payer: Self-pay | Admitting: Psychiatry

## 2020-10-30 DIAGNOSIS — F3341 Major depressive disorder, recurrent, in partial remission: Secondary | ICD-10-CM | POA: Diagnosis not present

## 2020-10-30 NOTE — Patient Instructions (Signed)
1Continue sertraline200 mg at night   2.Next appointment:4/28 at 11 AM

## 2020-11-02 ENCOUNTER — Telehealth: Payer: Self-pay | Admitting: Neurology

## 2020-11-02 ENCOUNTER — Encounter: Payer: Self-pay | Admitting: Neurology

## 2020-11-02 ENCOUNTER — Ambulatory Visit: Payer: Medicaid Other | Admitting: Neurology

## 2020-11-02 VITALS — BP 139/79 | HR 81 | Ht 73.0 in | Wt 275.0 lb

## 2020-11-02 DIAGNOSIS — G8929 Other chronic pain: Secondary | ICD-10-CM | POA: Diagnosis not present

## 2020-11-02 DIAGNOSIS — M5441 Lumbago with sciatica, right side: Secondary | ICD-10-CM | POA: Diagnosis not present

## 2020-11-02 DIAGNOSIS — R269 Unspecified abnormalities of gait and mobility: Secondary | ICD-10-CM

## 2020-11-02 DIAGNOSIS — F445 Conversion disorder with seizures or convulsions: Secondary | ICD-10-CM | POA: Insufficient documentation

## 2020-11-02 NOTE — Patient Instructions (Signed)
[facility]    Address: [address], [place], [place] [address]  Phone: [phone]   

## 2020-11-02 NOTE — Progress Notes (Signed)
Chief Complaint  Patient presents with  . Consult    Last seen in 08/11/20 for seizures and gait abnormality. He also has OSA. He has been referred back for low back pain and lower extremity weakness. These symptoms have been present for three months. Denies any known injury.       ASSESSMENT AND PLAN  Dale Bender is a 50 y.o. male  History of pseudoseizure, Depression anxiety on polypharmacy treatment, known history of cervical spondylosis, C5-6, C6-7 moderate stenosis New onset worsening low back pain, right arm and left subjective weakness,  Essentially normal neurological examination, variable effort on examination like what noticed at multiple prerecent visit,  Repeat MRI of cervical spine  X-ray of lumbar spine  Emphasized importance of stretching exercise, warm compression, as needed NSAIDs, continue work with physical therapy, and was referred by his primary care physician  Laboratory evaluation including CPK, thyroid functional test   DIAGNOSTIC DATA (LABS, IMAGING, TESTING) - I reviewed patient records, labs, notes, testing and imaging myself where available.  MRI of brain with without contrast November 2019 normal  MRI of lumbar spine December 2019: Normal  MRI cervical spine November 2020, multilevel degenerative changes, most obvious C5-6, C6-7, moderate spinal canal stenosis, variable degree of foraminal narrowing, no cord signal abnormality  MRI of thoracic spine, degenerative changes, but no evidence of significant canal or foraminal narrowing  Dale Bender is a 50 year old male, seen in request by his primary care physician from the [place] [facility] practice for evaluation of seizure, he is accompanied by his wife Dale Bender, initial evaluation was on July 22, 2018.  I have reviewed and summarized the hospital discharge and neurology consult note on November 22- 24, 2019.  He was diagnosed with urethral restriction since October  2019, painful urination, on July 03, 2018, he was arranged for prostate biopsy in conjunction with IV Levaquin, apparently had a seizure-like event during the procedure, per patient, he had no recollection of the event, woke up in the hospital, reviewing hospital discharge, on the day of discharge on July 05, 2018, he had multiple seizure-like event witnessed by his wife and the physician, but immediately stopped with painful stimulation,  He presented to local hospital again on July 06, 2018 after he was discharged to home, wife reported multiple seizure-like spells, whole body trembling, body thrashing out of the bed, lasting for few minutes, multiple episode in a day, patient stated that he had no recollection of the event, did not have loss of consciousness.   Since hospital discharge, he also complains of right leg pain, weakness, sensory loss, difficulty walking, using walker at home,  He drives tow machine, heavy equipment operation  Personally reviewed MRI of the brain on July 03, 2018, motion degraded imaging, no acute abnormality identified,  Right leg has no feelings, walk with walker, left leg is ok, he has in continence,  EEG on Jul 04 2018: normal.  Laboratory evaluation showed normal or negative alcohol, HIV, UDS was positive for opiates, benzodiazepine, CMP, CBC  UPDATE Sept 1 2020: He had 2 seizure activity, fall happened during sleep, wake him up from sleep, generalized tonic-clonic seizure, in June, and on March 24, 2019,  EEG was abnormal on April 05, 2019, there are frequent bilateral frontotemporal dominance 3 Hz spike slow activity, raise the possibility of bilateral fronto-temporal irritability.  He was taking lamotrigine 100 mg twice a day, now increased to lamotrigine ER 300 mg every night  He continue complains of low  back pain, right leg numbness, weakness, using bilateral crutches to walk, variable effort during today's  examination  MRI of lumbar spine December 2019, with no significant abnormality noted. EMG nerve conduction study was normal in January 2020, there is no evidence of right lower extremity neuropathy or right lumbosacral radiculopathy  Laboratory evaluations in September 2019, normal CBC, CMP showed elevated creatinine 1.4, negative HIV, UDS was positive for benzodiazepine and opiates.  Update February 16, 9677: He had umbilical hernia repair in April 2021, per hospital record, he had several episodes of post ictal status, had EEG by Dr. Shawna Clamp December 03, 2019, that was reported normal  Seen by neurologistDr. Raelyn Ensign episode suspicious for pseudoseizure, but there was no detailed prescription of the event  Laboratory evaluation February 2021, lamotrigine level was 8.2  He also has the diagnosis of obstructive sleep apnea, more than 90% compliance per downloaded record  UPDATE November 02 2020: He had a EMU admission in February 2021, EEG was normal, no seizures or epileptiform discharges seen.  One event was recorded without EEG change and was a nonepileptic event.    He was diagnosed with [facility] admission in April 9381 for umbilical herniorrhaphy, he had several episode of confusion, EEG was normal, again with diagnosed with pseudoseizure disorder,  He has been compliant with his CPAP machine  Today he came in with a new complaints of 3 months history of right side lower thoracic, low back pain, radiating pain to right lower extremity, to the point of causing gait abnormality, subjective right hand weakness, he had variable effort on motor examination, which was also observed during multiple previous examinations,  REVIEW OF SYSTEMS: Full 14 system review of systems performed and notable only for as above All other review of systems were negative.  PHYSICAL EXAM   Vitals:   11/02/20 0739  Weight: 275 lb (124.7 kg)   Not recorded     Body mass index  is 36.28 kg/m.  PHYSICAL EXAMNIATION:  Gen: NAD, conversant, well nourised, well groomed         NEUROLOGICAL EXAM:  MENTAL STATUS: Speech/cognition: Awake alert oriented to history taking and care of conversation  CRANIAL NERVES: CN II: Visual fields are full to confrontation. Pupils are round equal and briskly reactive to light. CN III, IV, VI: extraocular movement are normal. No ptosis. CN V: Facial sensation is intact to light touch CN VII: Face is symmetric with normal eye closure  CN VIII: Hearing is normal to causal conversation. CN IX, X: Phonation is normal. CN XI: Head turning and shoulder shrug are intact  MOTOR: No weakness of bilateral upper or lower extremity noted with coaching, but often complains of right side pain, variable effort on examination  REFLEXES: Reflexes are 2+ and symmetric at the biceps, triceps, knees, and ankles. Plantar responses are flexor.  SENSORY: Intact to light touch, pinprick and vibratory sensation are intact in fingers and toes.  COORDINATION: There is no trunk or limb dysmetria noted.  GAIT/STANCE: He can get up from seated position pushing on chair arm, steady, antalgic  ALLERGIES: No Known Allergies  HOME MEDICATIONS: Current Outpatient Medications  Medication Sig Dispense Refill  . albuterol (VENTOLIN HFA) 108 (90 Base) MCG/ACT inhaler Inhale 2 puffs into the lungs every 6 (six) hours as needed for wheezing or shortness of breath. 8 g 0  . LamoTRIgine 200 MG TB24 24 hour tablet Take 2 tablets (400 mg total) by mouth at bedtime. 180 tablet 4  . omeprazole (  PRILOSEC) 40 MG capsule Take 40 mg by mouth daily.    Marland Kitchen oxyCODONE-acetaminophen (PERCOCET) 7.5-325 MG tablet Take 1 tablet by mouth every 6 (six) hours as needed. 25 tablet 0  . sertraline (ZOLOFT) 100 MG tablet Take 2 tablets (200 mg total) by mouth daily. 180 tablet 0   No current facility-administered medications for this visit.    PAST MEDICAL HISTORY: Past  Medical History:  Diagnosis Date  . Anxiety   . Arthritis   . GERD (gastroesophageal reflux disease)   . History of seizures   . Prostate nodule    Benign  . Sleep apnea   . 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 FRACTURE SURGERY     LT arm, LT leg, Rt leg  . PROSTATE BIOPSY    . UMBILICAL HERNIA REPAIR N/A 12/03/2019   Procedure: HERNIA REPAIR UMBILICAL ADULT/ WITH MESH;  Surgeon: Aviva Signs, MD;  Location: [facility];  Service: General;  Laterality: N/A;  . URETHRAL STRICTURE DILATATION      FAMILY HISTORY: Family History  Problem Relation Age of Onset  . Hypertension Mother   . Diabetes Mother   . Other Father        died at age 66 in accident - log fell on him  . Kidney cancer Maternal Aunt   . Prostate cancer Paternal Uncle     SOCIAL HISTORY: Social History   Socioeconomic History  . Marital status: Married    Spouse name: Not on file  . Number of children: 2  . Years of education: 10th grade  . Highest education level: Not on file  Occupational History  . Occupation: tow Geophysical data processor  Tobacco Use  . Smoking status: Former Smoker    Packs/day: 0.50    Years: 10.00    Pack years: 5.00    Types: Cigarettes    Quit date: 12/01/2002    Years since quitting: 17.9  . Smokeless tobacco: Never Used  Vaping Use  . Vaping Use: Never used  Substance and Sexual Activity  . Alcohol use: Not Currently  . Drug use: Not Currently  . Sexual activity: Not on file  Other Topics Concern  . Not on file  Social History Narrative   Lives at home with his wife and children.   Right-handed.   2 cups caffeine per day.   Social Determinants of Health   Financial Resource Strain: Not on file  Food Insecurity: Not on file  Transportation Needs: Not on file  Physical Activity: Not on file  Stress: Not on file  Social Connections: Not on file  Intimate Partner Violence: Not on file      Marcial Pacas, M.D. Ph.D.  [facility] [address] [place], [place] [address] Ph: [phone] Fax: [phone]  CC:  Curlene Labrum, MD [place],  [place] [address]  Denny Levy, [place]

## 2020-11-02 NOTE — Telephone Encounter (Signed)
[facility] pending uploaded notes on the portal it can take up to 15 bussiness to hear back

## 2020-11-03 LAB — C-REACTIVE PROTEIN: CRP: 3 mg/L (ref 0–10)

## 2020-11-03 LAB — SEDIMENTATION RATE: Sed Rate: 2 mm/hr (ref 0–30)

## 2020-11-03 LAB — CK: Total CK: 359 U/L (ref 49–439)

## 2020-11-03 LAB — TSH: TSH: 1.92 u[IU]/mL (ref 0.450–4.500)

## 2020-11-10 ENCOUNTER — Other Ambulatory Visit: Payer: Medicaid Other

## 2020-11-13 ENCOUNTER — Other Ambulatory Visit: Payer: Self-pay

## 2020-11-13 ENCOUNTER — Ambulatory Visit (INDEPENDENT_AMBULATORY_CARE_PROVIDER_SITE_OTHER): Payer: Medicaid Other | Admitting: Clinical

## 2020-11-13 DIAGNOSIS — F331 Major depressive disorder, recurrent, moderate: Secondary | ICD-10-CM | POA: Diagnosis not present

## 2020-11-13 DIAGNOSIS — F419 Anxiety disorder, unspecified: Secondary | ICD-10-CM | POA: Diagnosis not present

## 2020-11-13 NOTE — Progress Notes (Signed)
   Virtual Visit via Video Note  I connected withEdward Desma Bender on 11/13/20 at  9:00 AM EDT by a video enabled telemedicine application and verified that I am speaking with the correct person using two identifiers.  Location: Patient: Home Provider: Office  I discussed the limitations of evaluation and management by telemedicine and the availability of in person appointments. The patient expressed understanding and agreed to proceed.   THERAPIST PROGRESS NOTE  Session Time:9:00AM-9:30AM  Participation Level:Active  Behavioral Response:CasualAlertIrratible  Type of Therapy:Individual Therapy  Treatment Goals addressed:Coping  Interventions:CBT, Solution Focused, Strength-based, Supportive and Social Skills Training  Summary:Dale J. Hamptonis [email or web address].[email or web address].malewho presents withDepression and Anxiety.The OPT therapist worked with thepatientfor his ongoing treatment. The OPT therapist utilized Motivational Interviewing to assist in creating therapeutic repore. The patient in the session was engaged and work in collaboration giving feedback about his triggers and symptoms over the past few weeksincludinghealth concerns with side pain he is now scheduled for a MRI this coming Saturday to learn more.The OPT therapist utilized Cognitive Behavioral Therapy through cognitive restructuring as well as worked with the patient on coping strategies to assist in management ofmood, improve coping, and improve positive thinking while empowering the patienttobe active and not self isolate.The OPT therapist examined upcoming health appointments and continued to encourage the patient to stay consistent and compliant with all medical appointments and recommendations.The patient in this session spoke more about his interactions in his relationship and working to find new ways to give the couple individual time.  Suicidal/Homicidal:Nowithout intent/plan  Therapist  Response:The OPT therapist worked with the patient for the patients scheduled session. The patient was engaged in his session and gave feedback in relation to triggers, symptoms, and behavior responses over the past fewweeks. The OPT therapist worked with the patient utilizing an in session Cognitive Behavioral Therapy exercise. The patient was responsive in the session andverbalized, " I want to find a way so that we are not right up under each other cause we are together 24/7 and sometimes that can create conflict". The OPT therapist worked with the patient to review potential individual leisure..The OPT therapist will continue treatment work with the patient in his next scheduled session.   Plan: Return again in2/3weeks.  Diagnosis:Axis I:Recurrent Depression with Anxiety Axis II:No diagnosis  I discussed the assessment and treatment plan with the patient. The patient was provided an opportunity to ask questions and all were answered. The patient agreed with the plan and demonstrated an understanding of the instructions.  The patient was advised to call back or seek an in-person evaluation if the symptoms worsen or if the condition fails to improve as anticipated.  I provided40minutes of non-face-to-face time during this encounter.  [name], LCSW  11/13/2020

## 2020-11-13 NOTE — Telephone Encounter (Signed)
I called [facility] and spoke with [name] she stated since the patient has Medicare A & B [facility] is secondary no Dale Bender is require. Patient is scheduled at GI for 11/18/20.

## 2020-11-18 ENCOUNTER — Ambulatory Visit
Admission: RE | Admit: 2020-11-18 | Discharge: 2020-11-18 | Disposition: A | Discharge status: Home / Self Care | Payer: Medicaid Other | Source: Ambulatory Visit | Attending: Neurology | Admitting: Neurology

## 2020-11-18 DIAGNOSIS — G8929 Other chronic pain: Secondary | ICD-10-CM

## 2020-11-18 DIAGNOSIS — R269 Unspecified abnormalities of gait and mobility: Secondary | ICD-10-CM

## 2020-11-20 ENCOUNTER — Telehealth: Payer: Self-pay | Admitting: Neurology

## 2020-11-20 DIAGNOSIS — R531 Weakness: Secondary | ICD-10-CM | POA: Insufficient documentation

## 2020-11-20 NOTE — Telephone Encounter (Signed)
I spoke to his wife on DPR and provided her with the results. She will relay the information to him and let him know to expect a call from neurosurgery. Provided our number to call back with any questions.

## 2020-11-20 NOTE — Telephone Encounter (Addendum)
   IMPRESSION: This MRI of the cervical spine without contrast shows the following: 1.   The spinal cord appears normal. 2.   At C3-C4, there are degenerative changes causing moderate bilateral foraminal narrowing with some encroachment upon both C4 nerve roots but no definite nerve root compression.  No spinal stenosis. 3.   At C5-C6, there is mild to moderate spinal stenosis due to degenerative changes and congenitally short pedicles.  There is moderate left greater than right foraminal narrowing that could affect the left C6 nerve root. 4.   At C6-C7, there is moderate spinal stenosis due to a small central disc herniation and other degenerative changes.  There is mild foraminal narrowing but no nerve root compression. 5.   Degenerative changes are stable compared to the 07/03/2019 MRI.   Please call patient, MRI of cervical spine showed multilevel degenerative changes, most obvious at C6-7, mild to moderate canal stenosis, ariable degree of foraminal narrowing, overall stable compared to previous MRI in November 2020  Because of his complains of gait abnormality, lower extremity weakness, I have put in a referral for neurosurgeon for evaluation  No orders of the defined types were placed in this encounter.   @EDPTMEDSTART @  There are no discontinued medications.  No follow-ups on file.

## 2020-11-20 NOTE — Addendum Note (Signed)
Addended by: Marcial Pacas on: 11/20/2020 09:24 AM   Modules accepted: Orders

## 2020-11-21 ENCOUNTER — Telehealth: Payer: Self-pay | Admitting: *Deleted

## 2020-11-21 NOTE — Telephone Encounter (Signed)
Received fax this am from [facility] for LTD information.  Requesting additional information.  Last seen 11-02-20 with Dr. Krista Blue.  Order for MRI Cervical (done)and DG Lumbar 2-3 view (not done-missed by [facility]).   Wanting copies of report.  Also needing clinical rationale that helped you to determine that he is unable to work in any capacity?  See form placed in inbox.  Do you want to proceed with lumbar XR's?

## 2020-11-22 ENCOUNTER — Ambulatory Visit: Payer: Medicaid Other | Admitting: Adult Health

## 2020-11-22 NOTE — Telephone Encounter (Signed)
Completed the question relating to provide clinical rationale that helped to determine that he is unable to work in any capacity.  (he has seizures, gait abnormality, back pain, LW weakness, referred to NS.  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  To Dr. Krista Blue to sign off.

## 2020-11-30 NOTE — Progress Notes (Signed)
Virtual Visit via Video Note  I connected with Dale Bender on 12/07/20 at 11:00 AM EDT by a video enabled telemedicine application and verified that I am speaking with the correct person using two identifiers.  Location: Patient: home Provider: office  Persons participated in the visit- patient, provider   I discussed the limitations of evaluation and management by telemedicine and the availability of in person appointments. The patient expressed understanding and agreed to proceed.    I discussed the assessment and treatment plan with the patient. The patient was provided an opportunity to ask questions and all were answered. The patient agreed with the plan and demonstrated an understanding of the instructions.   The patient was advised to call back or seek an in-person evaluation if the symptoms worsen or if the condition fails to improve as anticipated.  I provided 12 minutes of non-face-to-face time during this encounter.   [name], MD    [facility] MD/PA/NP OP Progress Note  12/07/2020 11:18 AM Dale Bender  MRN:  [id number]  Chief Complaint:  Chief Complaint    Follow-up; Depression     HPI:  This is a follow-up appointment for depression.  He states that he has been doing fine.  However on further elaboration, he has had worsening in back pain.  He will have MRI soon.  He was advised by his provider not to move or do physical activity at this time.  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  He tends to stay in the house after he brings his son to school.  He agrees that he feels frustrated that he is unable to do things due to his pain.  He feels down that he is unemployed.  He has depressive symptoms as in PH-9. He denies change in weight. He is interested in trying Abilify for depression.   Daily routine:stays in the house most of the time, goes to church on Fridays, Saturdays Employment:on long term  disability from his job, (currently waiting for decision from the state). He used to works at [employer] for 13 years [place], his children Marital status:married Number of children:45 (89, 75 year old) He feels "mistreated" in his childhood by his parents. He describes it as "not like average family grouping up" as they did not show love. He later reports good relationship with his father, who deceased at age 20 many years ago. His mother is in [place]. He has estranged relationship with his brother, sister while he "don't remember" why it happened  Visit Diagnosis:    ICD-10-CM   1. Mild episode of recurrent major depressive disorder ([place])  F33.0   2. MDD (major depressive disorder), recurrent, in partial remission (HCC)  F33.41 sertraline (ZOLOFT) 100 MG tablet    Past Psychiatric History: Please see initial evaluation for full details. I have reviewed the history. No updates at this time.     Past Medical History:  Past Medical History:  Diagnosis Date  . Anxiety   . Arthritis   . Back pain   . GERD (gastroesophageal reflux disease)   . History of seizures   . Lower extremity weakness   . Prostate nodule    Benign  . Sleep apnea   . 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 FRACTURE SURGERY     LT arm, LT leg, Rt leg  . PROSTATE BIOPSY    .  UMBILICAL HERNIA REPAIR N/A 12/03/2019   Procedure: HERNIA REPAIR UMBILICAL ADULT/ WITH MESH;  Surgeon: Aviva Signs, MD;  Location: [facility];  Service: General;  Laterality: N/A;  . URETHRAL STRICTURE DILATATION      Family Psychiatric History: Please see initial evaluation for full details. I have reviewed the history. No updates at this time.     Family History:  Family History  Problem Relation Age of Onset  . Hypertension Mother   . Diabetes Mother   . Other Father        died at age 53 in accident - log fell on him  . Kidney cancer Maternal Aunt   . Prostate  cancer Paternal Uncle     Social History:  Social History   Socioeconomic History  . Marital status: Married    Spouse name: Not on file  . Number of children: 2  . Years of education: 10th grade  . Highest education level: Not on file  Occupational History  . Occupation: Disability  Tobacco Use  . Smoking status: Former Smoker    Packs/day: 0.50    Years: 10.00    Pack years: 5.00    Types: Cigarettes    Quit date: 12/01/2002    Years since quitting: 18.0  . Smokeless tobacco: Never Used  Vaping Use  . Vaping Use: Never used  Substance and Sexual Activity  . Alcohol use: Not Currently  . Drug use: Not Currently  . Sexual activity: Not on file  Other Topics Concern  . Not on file  Social History Narrative   Lives at home with his wife and children.   Right-handed.   2 cups caffeine per day.   Social Determinants of Health   Financial Resource Strain: Not on file  Food Insecurity: Not on file  Transportation Needs: Not on file  Physical Activity: Not on file  Stress: Not on file  Social Connections: Not on file    Allergies: No Known Allergies  Metabolic Disorder Labs: No results found for: HGBA1C, MPG No results found for: PROLACTIN Lab Results  Component Value Date   TRIG 187 (H) 08/05/2019   Lab Results  Component Value Date   TSH 1.920 11/02/2020   TSH 2.430 06/10/2019    Therapeutic Level Labs: No results found for: LITHIUM No results found for: VALPROATE No components found for:  CBMZ  Current Medications: Current Outpatient Medications  Medication Sig Dispense Refill  . ARIPiprazole (ABILIFY) 2 MG tablet Take 1 tablet (2 mg total) by mouth daily. 30 tablet 1  . albuterol (VENTOLIN HFA) 108 (90 Base) MCG/ACT inhaler Inhale 2 puffs into the lungs every 6 (six) hours as needed for wheezing or shortness of breath. 8 g 0  . LamoTRIgine 200 MG TB24 24 hour tablet Take 2 tablets (400 mg total) by mouth at bedtime. 180 tablet 4  . omeprazole  (PRILOSEC) 40 MG capsule Take 40 mg by mouth daily.    Derrill Memo ON 12/12/2020] sertraline (ZOLOFT) 100 MG tablet Take 2 tablets (200 mg total) by mouth daily. 180 tablet 1   No current facility-administered medications for this visit.     Musculoskeletal: Strength & Muscle Tone: N/A Gait & Station: N/A Patient leans: N/A  Psychiatric Specialty Exam: Review of Systems  Psychiatric/Behavioral: Positive for dysphoric mood and sleep disturbance. Negative for agitation, behavioral problems, confusion, decreased concentration, hallucinations, self-injury and suicidal ideas. The patient is not nervous/anxious and is not hyperactive.   All other systems reviewed and are negative.  There were no vitals taken for this visit.There is no height or weight on file to calculate BMI.  General Appearance: Fairly Groomed  Eye Contact:  Good  Speech:  Clear and Coherent  Volume:  Normal  Mood:  fine  Affect:  Appropriate, Congruent and down at times  Thought Process:  Coherent  Orientation:  Full (Time, Place, and Person)  Thought Content: Logical   Suicidal Thoughts:  No  Homicidal Thoughts:  No  Memory:  Immediate;   Good  Judgement:  Good  Insight:  Good  Psychomotor Activity:  Normal  Concentration:  Concentration: Good and Attention Span: Good  Recall:  Good  Fund of Knowledge: Good  Language: Good  Akathisia:  No  Handed:  Right  AIMS (if indicated): not done  Assets:  Communication Skills Desire for Improvement  ADL's:  Intact  Cognition: WNL  Sleep:  Fair   Screenings: PHQ2-9   Flowsheet Row Video Visit from 12/07/2020 in [place] Video Visit from 10/30/2020 in [place]  PHQ-2 Total Score 4 3  PHQ-9 Total Score 9 6    Flowsheet Row Video Visit from 12/07/2020 in [place] Video Visit from 10/30/2020 in [place] No Risk No Risk        Assessment and Plan:  Dale Bender is a 50 y.o. year old male with a history of  depression, complex partial seizure,obstructive sleep apnea, who presents for follow up appointment for below.   1. Mild episode of recurrent major depressive disorder ([place]) He reports slight worsening in depressive symptoms in the context of worsening in back pain. Psychosocial stressors include unemployment,demoralization secondary to physical condition,which includes seizure,visual changes,and right leg weakness.   Will add Abilify as adjunctive treatment for depression.  Noted that he will not be a good candidate for bupropion due to history of seizure.  Discussed potential metabolic side effect and EPS.  Will continue sertraline as maintenance therapy for depression.   Plan 1Continuesertraline200 mgat night 2. Start Abilify 2 mg at night  3.Next appointment:6/23 at 1:20 for 20 mins, video 4.ReviewedTSH; wnl  The patient demonstrates the following risk factors for suicide: Chronic risk factors for suicide include:psychiatric disorder ofdepression. Acute risk factorsfor suicide include: loss (financial, interpersonal, professional). Protective factorsfor this patient include: positive social support, responsibility to others (children, family), coping skills and hope for the future. Considering these factors, the overall suicide risk at this point appears to below. Patientisappropriate for outpatient follow up.  [name], MD 12/07/2020, 11:18 AM

## 2020-12-07 ENCOUNTER — Encounter: Payer: Self-pay | Admitting: Psychiatry

## 2020-12-07 ENCOUNTER — Other Ambulatory Visit: Payer: Self-pay

## 2020-12-07 ENCOUNTER — Telehealth (INDEPENDENT_AMBULATORY_CARE_PROVIDER_SITE_OTHER): Payer: Medicaid Other | Admitting: Psychiatry

## 2020-12-07 DIAGNOSIS — F3341 Major depressive disorder, recurrent, in partial remission: Secondary | ICD-10-CM | POA: Diagnosis not present

## 2020-12-07 DIAGNOSIS — F330 Major depressive disorder, recurrent, mild: Secondary | ICD-10-CM

## 2020-12-07 MED ORDER — ARIPIPRAZOLE 2 MG PO TABS: 2.0000 mg | ORAL_TABLET | Freq: Every day | ORAL | 1 refills | Status: DC

## 2020-12-07 MED ORDER — SERTRALINE HCL 100 MG PO TABS: 200.0000 mg | ORAL_TABLET | Freq: Every day | ORAL | 1 refills | Status: DC

## 2020-12-07 NOTE — Patient Instructions (Signed)
1Continuesertraline200 mgat night 2. Start Abilfy 2 mg at night  3.Next appointment:6/23 at 1:20

## 2020-12-12 ENCOUNTER — Ambulatory Visit (HOSPITAL_COMMUNITY): Payer: Medicaid Other | Admitting: Clinical

## 2020-12-12 DIAGNOSIS — R0689 Other abnormalities of breathing: Secondary | ICD-10-CM | POA: Diagnosis not present

## 2020-12-12 DIAGNOSIS — K529 Noninfective gastroenteritis and colitis, unspecified: Secondary | ICD-10-CM | POA: Diagnosis not present

## 2020-12-12 DIAGNOSIS — M47817 Spondylosis without myelopathy or radiculopathy, lumbosacral region: Secondary | ICD-10-CM | POA: Diagnosis not present

## 2020-12-12 DIAGNOSIS — S50311A Abrasion of right elbow, initial encounter: Secondary | ICD-10-CM | POA: Diagnosis not present

## 2020-12-12 DIAGNOSIS — R404 Transient alteration of awareness: Secondary | ICD-10-CM | POA: Diagnosis not present

## 2020-12-12 DIAGNOSIS — R410 Disorientation, unspecified: Secondary | ICD-10-CM | POA: Diagnosis not present

## 2020-12-12 DIAGNOSIS — R059 Cough, unspecified: Secondary | ICD-10-CM | POA: Diagnosis not present

## 2020-12-12 DIAGNOSIS — R569 Unspecified convulsions: Secondary | ICD-10-CM | POA: Diagnosis not present

## 2020-12-12 DIAGNOSIS — R4020 Unspecified coma: Secondary | ICD-10-CM | POA: Diagnosis not present

## 2020-12-12 DIAGNOSIS — G8929 Other chronic pain: Secondary | ICD-10-CM | POA: Diagnosis not present

## 2020-12-12 DIAGNOSIS — I10 Essential (primary) hypertension: Secondary | ICD-10-CM | POA: Diagnosis not present

## 2020-12-12 DIAGNOSIS — R4182 Altered mental status, unspecified: Secondary | ICD-10-CM | POA: Diagnosis not present

## 2020-12-12 DIAGNOSIS — M47816 Spondylosis without myelopathy or radiculopathy, lumbar region: Secondary | ICD-10-CM | POA: Diagnosis not present

## 2020-12-13 DIAGNOSIS — Z6835 Body mass index (BMI) 35.0-35.9, adult: Secondary | ICD-10-CM | POA: Diagnosis not present

## 2020-12-13 DIAGNOSIS — M47816 Spondylosis without myelopathy or radiculopathy, lumbar region: Secondary | ICD-10-CM | POA: Diagnosis not present

## 2020-12-13 DIAGNOSIS — M5442 Lumbago with sciatica, left side: Secondary | ICD-10-CM | POA: Diagnosis not present

## 2020-12-14 ENCOUNTER — Ambulatory Visit (INDEPENDENT_AMBULATORY_CARE_PROVIDER_SITE_OTHER): Payer: Medicare HMO | Admitting: Clinical

## 2020-12-14 ENCOUNTER — Other Ambulatory Visit: Payer: Self-pay

## 2020-12-14 DIAGNOSIS — F331 Major depressive disorder, recurrent, moderate: Secondary | ICD-10-CM | POA: Diagnosis not present

## 2020-12-14 DIAGNOSIS — F419 Anxiety disorder, unspecified: Secondary | ICD-10-CM | POA: Diagnosis not present

## 2020-12-14 NOTE — Progress Notes (Signed)
Virtual Visit via Video Note  I connected withEdward J Hamptonon 05/5/22at 9:00 AM EDTby a video enabled telemedicine application and verified that I am speaking with the correct person using two identifiers.  Location: Patient:Home Provider:Office  I discussed the limitations of evaluation and management by telemedicine and the availability of in person appointments. The patient expressed understanding and agreed to proceed.   THERAPIST PROGRESS NOTE  Session Time:9:00AM-9:25AM  Participation Level:Active  Behavioral Response:CasualAlertImprovedMood  Type of Therapy:Individual Therapy  Treatment Goals addressed:Coping  Interventions:CBT, Solution Focused, Strength-based, Supportive and Social Skills Training  Summary:Dale J. Hamptonis a49y.o.malewho presents withDepression and Anxiety.The OPT therapist worked with thepatientfor [place]. The OPT therapist utilized Motivational Interviewing to assist in creating therapeutic repore. The patient in the session was engaged and work in collaboration giving feedback about his triggers and symptoms over the past few weeksincludinggetting more test results and learning that the back pain he has been experiencing [name] from arthritis and that this will not required surgery, however, the patient will be getting injections to help with reducing the pain from the arthritis in his back.The OPT therapist utilized Cognitive Behavioral Therapy through cognitive restructuring as well as worked with the patient on coping strategies to assist in management ofmood, improve coping, and improve positive thinking while empowering the patienttobe active and not self isolate.The OPT therapist examined upcoming health appointments and continued to encourage the patient to stay consistent and compliant with all medical appointments and recommendations.The patient in this session spoke about how now knowing what is  going on and not having to have any corrective surgery was " a blessing" and the patient noted he feels a large weight was lifted now that he knows what was causing the pain and the health treatment plan for addressing the pain moving forward.  Suicidal/Homicidal:Nowithout intent/plan  Therapist Response:The OPT therapist worked with the patient for the patients scheduled session. The patient was engaged in his session and gave feedback in relation to triggers, symptoms, and behavior responses over the past fewweeks. The OPT therapist worked with the patient utilizing an in session Cognitive Behavioral Therapy exercise. The patient was responsive in the session andverbalized, "I feel a lot better knowing I will not have to have surgeryt". The patient spoke about plans for the upcoming Mothers Day weekend in being involved and active as well as his plans for leisure in going fishing.The OPT therapist will continue treatment work with the patient in his next scheduled session.   Plan: Return again in2/3weeks.  Diagnosis:Axis I:Recurrent Depression with Anxiety Axis II:No diagnosis  I discussed the assessment and treatment plan with the patient. The patient was provided an opportunity to ask questions and all were answered. The patient agreed with the plan and demonstrated an understanding of the instructions.  The patient was advised to call back or seek an in-person evaluation if the symptoms worsen or if the condition fails to improve as anticipated.  I provided14minutes of non-face-to-face time during this encounter.  Dale Hides, LCSW  12/14/2020

## 2020-12-22 DIAGNOSIS — M62838 Other muscle spasm: Secondary | ICD-10-CM | POA: Diagnosis not present

## 2020-12-22 DIAGNOSIS — R5383 Other fatigue: Secondary | ICD-10-CM | POA: Diagnosis not present

## 2020-12-22 DIAGNOSIS — R531 Weakness: Secondary | ICD-10-CM | POA: Diagnosis not present

## 2020-12-22 DIAGNOSIS — J309 Allergic rhinitis, unspecified: Secondary | ICD-10-CM | POA: Diagnosis not present

## 2020-12-22 DIAGNOSIS — G40909 Epilepsy, unspecified, not intractable, without status epilepticus: Secondary | ICD-10-CM | POA: Diagnosis not present

## 2020-12-22 DIAGNOSIS — M545 Low back pain, unspecified: Secondary | ICD-10-CM | POA: Diagnosis not present

## 2021-01-01 DIAGNOSIS — R2689 Other abnormalities of gait and mobility: Secondary | ICD-10-CM | POA: Diagnosis not present

## 2021-01-01 DIAGNOSIS — M5440 Lumbago with sciatica, unspecified side: Secondary | ICD-10-CM | POA: Diagnosis not present

## 2021-01-01 DIAGNOSIS — R531 Weakness: Secondary | ICD-10-CM | POA: Diagnosis not present

## 2021-01-04 ENCOUNTER — Ambulatory Visit (INDEPENDENT_AMBULATORY_CARE_PROVIDER_SITE_OTHER): Payer: Medicare HMO | Admitting: Clinical

## 2021-01-04 ENCOUNTER — Other Ambulatory Visit: Payer: Self-pay

## 2021-01-04 DIAGNOSIS — R2689 Other abnormalities of gait and mobility: Secondary | ICD-10-CM | POA: Diagnosis not present

## 2021-01-04 DIAGNOSIS — M5440 Lumbago with sciatica, unspecified side: Secondary | ICD-10-CM | POA: Diagnosis not present

## 2021-01-04 DIAGNOSIS — F419 Anxiety disorder, unspecified: Secondary | ICD-10-CM | POA: Diagnosis not present

## 2021-01-04 DIAGNOSIS — F331 Major depressive disorder, recurrent, moderate: Secondary | ICD-10-CM | POA: Diagnosis not present

## 2021-01-04 DIAGNOSIS — R531 Weakness: Secondary | ICD-10-CM | POA: Diagnosis not present

## 2021-01-04 NOTE — Progress Notes (Signed)
Virtual Visit via Video Note  I connected withEdward J Hamptonon 05/26/22at 9:00 AM EDTby a video enabled telemedicine application and verified that I am speaking with the correct person using two identifiers.  Location: Patient:Home Provider:Office  I discussed the limitations of evaluation and management by telemedicine and the availability of in person appointments. The patient expressed understanding and agreed to proceed.   THERAPIST PROGRESS NOTE  Session Time:9:00AM-9:25AM  Participation Level:Active  Behavioral Response:CasualAlertImprovedMood  Type of Therapy:Individual Therapy  Treatment Goals addressed:Coping  Interventions:CBT, Solution Focused, Strength-based, Supportive and Social Skills Training  Summary:Dale J. Hamptonis a49y.o.malewho presents withDepression and Anxiety.The OPT therapist worked with thepatientfor [place]. The OPT therapist utilized Motivational Interviewing to assist in creating therapeutic repore. The patient in the session was engaged and work in collaboration giving feedback about his triggers and symptoms over the past few weeksincludingongoing management of back pain.The OPT therapist utilized Cognitive Behavioral Therapy through cognitive restructuring as well as worked with the patient on coping strategies to assist in management ofmood, improve coping, and improve positive thinking while empowering the patienttobe active and not self isolate.The OPT therapist examined upcoming health appointments and continued to encourage the patient to stay consistent and compliant with all medical appointments and recommendations.The patient in this session spoke about being more active and involved with his pain management.  Suicidal/Homicidal:Nowithout intent/plan  Therapist Response:The OPT therapist worked with the patient for the patients scheduled session. The patient was engaged in his  session and gave feedback in relation to triggers, symptoms, and behavior responses over the past fewweeks. The OPT therapist worked with the patient utilizing an in session Cognitive Behavioral Therapy exercise. The patient was responsive in the session andverbalized, "I have been feeling better and doing more I am going to be cooking out this holiday weekend and I am getting back into Fishing.The OPT therapist will continue treatment work with the patient in his next scheduled session.   Plan: Return again in2/3weeks.  Diagnosis:Axis I:Recurrent Depression with Anxiety Axis II:No diagnosis  I discussed the assessment and treatment plan with the patient. The patient was provided an opportunity to ask questions and all were answered. The patient agreed with the plan and demonstrated an understanding of the instructions.  The patient was advised to call back or seek an in-person evaluation if the symptoms worsen or if the condition fails to improve as anticipated.  I provided64minutes of non-face-to-face time during this encounter.  Dale Hides, LCSW  01/04/2021

## 2021-01-11 DIAGNOSIS — G4733 Obstructive sleep apnea (adult) (pediatric): Secondary | ICD-10-CM | POA: Diagnosis not present

## 2021-01-12 DIAGNOSIS — R531 Weakness: Secondary | ICD-10-CM | POA: Diagnosis not present

## 2021-01-12 DIAGNOSIS — R2689 Other abnormalities of gait and mobility: Secondary | ICD-10-CM | POA: Diagnosis not present

## 2021-01-12 DIAGNOSIS — M5440 Lumbago with sciatica, unspecified side: Secondary | ICD-10-CM | POA: Diagnosis not present

## 2021-01-17 DIAGNOSIS — M545 Low back pain, unspecified: Secondary | ICD-10-CM | POA: Diagnosis not present

## 2021-01-17 DIAGNOSIS — R29898 Other symptoms and signs involving the musculoskeletal system: Secondary | ICD-10-CM | POA: Diagnosis not present

## 2021-01-17 DIAGNOSIS — R2689 Other abnormalities of gait and mobility: Secondary | ICD-10-CM | POA: Diagnosis not present

## 2021-01-19 DIAGNOSIS — M545 Low back pain, unspecified: Secondary | ICD-10-CM | POA: Diagnosis not present

## 2021-01-19 DIAGNOSIS — R2689 Other abnormalities of gait and mobility: Secondary | ICD-10-CM | POA: Diagnosis not present

## 2021-01-19 DIAGNOSIS — R29898 Other symptoms and signs involving the musculoskeletal system: Secondary | ICD-10-CM | POA: Diagnosis not present

## 2021-01-23 ENCOUNTER — Ambulatory Visit: Payer: Medicaid Other | Admitting: Neurology

## 2021-01-25 ENCOUNTER — Ambulatory Visit (INDEPENDENT_AMBULATORY_CARE_PROVIDER_SITE_OTHER): Payer: Medicare HMO | Admitting: Clinical

## 2021-01-25 ENCOUNTER — Other Ambulatory Visit: Payer: Self-pay

## 2021-01-25 DIAGNOSIS — F419 Anxiety disorder, unspecified: Secondary | ICD-10-CM

## 2021-01-25 DIAGNOSIS — F331 Major depressive disorder, recurrent, moderate: Secondary | ICD-10-CM

## 2021-01-25 DIAGNOSIS — R29898 Other symptoms and signs involving the musculoskeletal system: Secondary | ICD-10-CM | POA: Diagnosis not present

## 2021-01-25 DIAGNOSIS — M545 Low back pain, unspecified: Secondary | ICD-10-CM | POA: Diagnosis not present

## 2021-01-25 DIAGNOSIS — R2689 Other abnormalities of gait and mobility: Secondary | ICD-10-CM | POA: Diagnosis not present

## 2021-01-25 NOTE — Progress Notes (Signed)
Virtual Visit via Video Note   I connected with Dale Bender on 01/25/21 at  10:00 AM EDT by a video enabled telemedicine application and verified that I am speaking with the correct person using two identifiers.   Location: Patient: Home Provider: Office   I discussed the limitations of evaluation and management by telemedicine and the availability of in person appointments. The patient expressed understanding and agreed to proceed.     THERAPIST PROGRESS NOTE   Session Time: 10:00AM-10:25AM   Participation Level: Active   Behavioral Response: CasualAlertImprovedMood   Type of Therapy: Individual Therapy   Treatment Goals addressed: Coping   Interventions: CBT, Solution Focused, Strength-based, Supportive and Social Skills Training   Summary: [name]. [name] is a 50 y.o. male who presents with Depression and Anxiety . The OPT therapist worked with the patient for his ongoing treatment. The OPT therapist utilized Motivational Interviewing to assist in creating therapeutic repore. The patient in the session was engaged and work in collaboration giving feedback about his triggers and symptoms over the past few weeks including ongoing management of back pain. The OPT therapist utilized Cognitive Behavioral Therapy through cognitive restructuring as well as worked with the patient on coping strategies to assist in management of mood, improve coping, and improve positive thinking while empowering the patient to be active and not self isolate.The OPT therapist examined upcoming health appointments and continued to encourage the patient to stay consistent and compliant with all medical appointments and recommendations.The patient in this session spoke about making plans with family and in the upcoming week going Fishing   Suicidal/Homicidal: Nowithout intent/plan   Therapist Response: The OPT therapist worked with the patient for the patients  scheduled session. The patient was engaged in  his session and gave feedback in relation to triggers, symptoms, and behavior responses over the past few weeks. The OPT therapist worked with the patient utilizing an in session Cognitive Behavioral Therapy exercise. The patient was responsive in the session and verbalized, " I am going to do it this weekend I have plans and I have my rods ready to go fishing my wife is going to be at [employer] and I am going with some family Fishing". The OPT therapist will continue treatment work with the patient in his next scheduled session.     Plan: Return again in 2/3 weeks.   Diagnosis:      Axis I: Recurrent Depression with Anxiety                         Axis II: No diagnosis   I discussed the assessment and treatment plan with the patient. The patient was provided an opportunity to ask questions and all were answered. The patient agreed with the plan and demonstrated an understanding of the instructions.   The patient was advised to call back or seek an in-person evaluation if the symptoms worsen or if the condition fails to improve as anticipated.   I provided 30 minutes of non-face-to-face time during this encounter.   [name], LCSW   01/25/2021

## 2021-01-29 DIAGNOSIS — R2689 Other abnormalities of gait and mobility: Secondary | ICD-10-CM | POA: Diagnosis not present

## 2021-01-29 DIAGNOSIS — M545 Low back pain, unspecified: Secondary | ICD-10-CM | POA: Diagnosis not present

## 2021-01-29 DIAGNOSIS — R29898 Other symptoms and signs involving the musculoskeletal system: Secondary | ICD-10-CM | POA: Diagnosis not present

## 2021-01-29 NOTE — Progress Notes (Signed)
Virtual Visit via Video Note  I connected with Dale Bender on 02/01/21 at  1:20 PM EDT by a video enabled telemedicine application and verified that I am speaking with the correct person using two identifiers.  Location: Patient: home Provider: office Persons participated in the visit- patient, provider    I discussed the limitations of evaluation and management by telemedicine and the availability of in person appointments. The patient expressed understanding and agreed to proceed.   I discussed the assessment and treatment plan with the patient. The patient was provided an opportunity to ask questions and all were answered. The patient agreed with the plan and demonstrated an understanding of the instructions.   The patient was advised to call back or seek an in-person evaluation if the symptoms worsen or if the condition fails to improve as anticipated.  I provided 10 minutes of non-face-to-face time during this encounter.   [name], MD     [facility] MD/PA/NP OP Progress Note  02/01/2021 1:41 PM Dale Bender  MRN:  [id number]  Chief Complaint:  Chief Complaint   Follow-up; Depression    HPI:  This is a follow-up appointment for depression.  He states that he could not continue Abilify as he was more irritable and had nausea.  He occasionally feels depressed especially when he has back pain.  He was found to have arthritis, and was started on meloxicam.  He will see a pain specialist next month.  Although there was a day he had passive SI due to worsening in pain, he denies any intent or plan.  He denies any SI since that incident.  He sleeps better after starting CPAP machine.  He enjoys being with her kids when he does not have pain.  He denies any change in weight or appetite.  He denies SI.  He wants to stay on the current medication at this time.   Daily routine: stays in the house most of the time, goes to church on Fridays, Saturdays Employment: on long term  disability from his job, (currently waiting for decision from the state). He used to works at [employer] for 13 years Support: [place]: wife, his children Marital status: married  Number of children: 22 (23, 4 year old) He feels "mistreated" in his childhood by his parents. He describes it as "not like average family grouping up" as they did not show love. He later reports good relationship with his father, who deceased at age 66 many years ago. His mother is in [place]. He has estranged relationship with his brother, sister while he "don't remember" why it happened  Visit Diagnosis:    ICD-10-CM   1. Mild episode of recurrent major depressive disorder ([place])  F33.0       Past Psychiatric History: Please see initial evaluation for full details. I have reviewed the history. No updates at this time.     Past Medical History:  Past Medical History:  Diagnosis Date   Anxiety    Arthritis    Back pain    GERD (gastroesophageal reflux disease)    History of seizures    Lower extremity weakness    Prostate nodule    Benign   Sleep apnea    Urethral stricture in male     Past Surgical History:  Procedure Laterality Date   FRACTURE SURGERY     LT arm, LT leg, Rt leg   PROSTATE BIOPSY     UMBILICAL HERNIA REPAIR N/A 12/03/2019   Procedure:  HERNIA REPAIR UMBILICAL ADULT/ WITH MESH;  Surgeon: Aviva Signs, MD;  Location: [facility];  Service: General;  Laterality: N/A;   URETHRAL STRICTURE DILATATION      Family Psychiatric History: Please see initial evaluation for full details. I have reviewed the history. No updates at this time.     Family History:  Family History  Problem Relation Age of Onset   Hypertension Mother    Diabetes Mother    Other Father        died at age 36 in accident - log fell on him   Kidney cancer Maternal Aunt    Prostate cancer Paternal Uncle     Social History:  Social History   Socioeconomic History   Marital status: Married    Spouse name:  Not on file   Number of children: 2   Years of education: 10th grade   Highest education level: Not on file  Occupational History   Occupation: Disability  Tobacco Use   Smoking status: Former    Packs/day: 0.50    Years: 10.00    Pack years: 5.00    Types: Cigarettes    Quit date: 12/01/2002    Years since quitting: 18.1   Smokeless tobacco: Never  Vaping Use   Vaping Use: Never used  Substance and Sexual Activity   Alcohol use: Not Currently   Drug use: Not Currently   Sexual activity: Not on file  Other Topics Concern   Not on file  Social History Narrative   Lives at home with his wife and children.   Right-handed.   2 cups caffeine per day.   Social Determinants of Health   Financial Resource Strain: Not on file  Food Insecurity: Not on file  Transportation Needs: Not on file  Physical Activity: Not on file  Stress: Not on file  Social Connections: Not on file    Allergies: No Known Allergies  Metabolic Disorder Labs: No results found for: HGBA1C, MPG No results found for: PROLACTIN Lab Results  Component Value Date   TRIG 187 (H) 08/05/2019   Lab Results  Component Value Date   TSH 1.920 11/02/2020   TSH 2.430 06/10/2019    Therapeutic Level Labs: No results found for: LITHIUM No results found for: VALPROATE No components found for:  CBMZ  Current Medications: Current Outpatient Medications  Medication Sig Dispense Refill   meloxicam (MOBIC) 7.5 MG tablet Take 7.5 mg by mouth in the morning and at bedtime.     albuterol (VENTOLIN HFA) 108 (90 Base) MCG/ACT inhaler Inhale 2 puffs into the lungs every 6 (six) hours as needed for wheezing or shortness of breath. 8 g 0   LamoTRIgine 200 MG TB24 24 hour tablet Take 2 tablets (400 mg total) by mouth at bedtime. 180 tablet 4   omeprazole (PRILOSEC) 40 MG capsule Take 40 mg by mouth daily.     sertraline (ZOLOFT) 100 MG tablet Take 2 tablets (200 mg total) by mouth daily. 180 tablet 1   No current  facility-administered medications for this visit.     Musculoskeletal: Strength & Muscle Tone:  N/A Gait & Station:  N/A Patient leans: N/A  Psychiatric Specialty Exam: Review of Systems  Psychiatric/Behavioral:  Positive for dysphoric mood. Negative for agitation, behavioral problems, confusion, decreased concentration, hallucinations, self-injury, sleep disturbance and suicidal ideas. The patient is nervous/anxious. The patient is not hyperactive.   All other systems reviewed and are negative.  There were no vitals taken for this visit.There is  no height or weight on file to calculate BMI.  General Appearance: Fairly Groomed  Eye Contact:  Good  Speech:  Clear and Coherent  Volume:  Normal  Mood:   not good  Affect:  Appropriate, Congruent, and euthymic  Thought Process:  Coherent  Orientation:  Full (Time, Place, and Person)  Thought Content: Logical   Suicidal Thoughts:  No  Homicidal Thoughts:  No  Memory:  Immediate;   Good  Judgement:  Good  Insight:  Good  Psychomotor Activity:  Normal  Concentration:  Concentration: Good and Attention Span: Good  Recall:  Good  Fund of Knowledge: Good  Language: Good  Akathisia:  No  Handed:  Right  AIMS (if indicated): not done  Assets:  Communication Skills Desire for Improvement  ADL's:  Intact  Cognition: WNL  Sleep:  Fair   Screenings: PHQ2-9    Flowsheet Row Video Visit from 12/07/2020 in [place] Video Visit from 10/30/2020 in [place]  PHQ-2 Total Score 4 3  PHQ-9 Total Score 9 6      Flowsheet Row Video Visit from 12/07/2020 in [place] Video Visit from 10/30/2020 in [place] No Risk No Risk        Assessment and Plan:  Dale Bender is a 50 y.o. year old male with a history of depression, complex partial seizure, obstructive sleep apnea who presents for follow  up appointment for below.    1. Mild episode of recurrent major depressive disorder ([place]) Although he occasionally reports depressive symptoms, this is more situational/in the context of having worsening in back pain. Psychosocial stressors include unemployment, demoralization secondary to physical condition, which includes seizure, visual changes, and right leg weakness.  He could not tolerate Abilify due to side effect; will discontinue this medication.   Although he may benefit from adjunctive treatment for depression, he prefers to stay on the current medication regimen. Will continue sertraline for depression.    Plan  Continue sertraline 200 mg at night   2. Next appointment- 8/25 at 8:40 for 20 mins, video 3. Reviewed TSH ; wnl  Past trials of medication: sertraline, Abilify (irritability, nausea)   Emergency resources which includes 911, ED, suicide crisis line [phone]) are discussed.   The patient demonstrates the following risk factors for suicide: Chronic risk factors for suicide include: psychiatric disorder of depression. Acute risk factors for suicide include: loss (financial, interpersonal, professional). Protective factors for this patient include: positive social support, responsibility to others (children, family), coping skills and hope for the future. Considering these factors, the overall suicide risk at this point appears to be low. Patient is appropriate for outpatient follow up.         [name], MD 02/01/2021, 1:41 PM

## 2021-02-01 ENCOUNTER — Telehealth (INDEPENDENT_AMBULATORY_CARE_PROVIDER_SITE_OTHER): Payer: Medicare HMO | Admitting: Psychiatry

## 2021-02-01 ENCOUNTER — Encounter: Payer: Self-pay | Admitting: Psychiatry

## 2021-02-01 ENCOUNTER — Other Ambulatory Visit: Payer: Self-pay

## 2021-02-01 DIAGNOSIS — F330 Major depressive disorder, recurrent, mild: Secondary | ICD-10-CM

## 2021-02-01 NOTE — Patient Instructions (Signed)
Continue sertraline 200 mg at night   2. Next appointment- 8/25 at 8:40

## 2021-02-06 DIAGNOSIS — R29898 Other symptoms and signs involving the musculoskeletal system: Secondary | ICD-10-CM | POA: Diagnosis not present

## 2021-02-06 DIAGNOSIS — M5440 Lumbago with sciatica, unspecified side: Secondary | ICD-10-CM | POA: Diagnosis not present

## 2021-02-06 DIAGNOSIS — M79661 Pain in right lower leg: Secondary | ICD-10-CM | POA: Diagnosis not present

## 2021-02-06 DIAGNOSIS — M79651 Pain in right thigh: Secondary | ICD-10-CM | POA: Diagnosis not present

## 2021-02-06 DIAGNOSIS — R2689 Other abnormalities of gait and mobility: Secondary | ICD-10-CM | POA: Diagnosis not present

## 2021-02-07 DIAGNOSIS — Z0100 Encounter for examination of eyes and vision without abnormal findings: Secondary | ICD-10-CM | POA: Diagnosis not present

## 2021-02-09 DIAGNOSIS — R29898 Other symptoms and signs involving the musculoskeletal system: Secondary | ICD-10-CM | POA: Diagnosis not present

## 2021-02-09 DIAGNOSIS — M79651 Pain in right thigh: Secondary | ICD-10-CM | POA: Diagnosis not present

## 2021-02-09 DIAGNOSIS — R2689 Other abnormalities of gait and mobility: Secondary | ICD-10-CM | POA: Diagnosis not present

## 2021-02-09 DIAGNOSIS — M5440 Lumbago with sciatica, unspecified side: Secondary | ICD-10-CM | POA: Diagnosis not present

## 2021-02-09 DIAGNOSIS — M79661 Pain in right lower leg: Secondary | ICD-10-CM | POA: Diagnosis not present

## 2021-02-15 DIAGNOSIS — G40909 Epilepsy, unspecified, not intractable, without status epilepticus: Secondary | ICD-10-CM | POA: Diagnosis not present

## 2021-02-15 DIAGNOSIS — S39012A Strain of muscle, fascia and tendon of lower back, initial encounter: Secondary | ICD-10-CM | POA: Diagnosis not present

## 2021-02-15 DIAGNOSIS — M62838 Other muscle spasm: Secondary | ICD-10-CM | POA: Diagnosis not present

## 2021-02-15 DIAGNOSIS — R531 Weakness: Secondary | ICD-10-CM | POA: Diagnosis not present

## 2021-02-15 DIAGNOSIS — R5383 Other fatigue: Secondary | ICD-10-CM | POA: Diagnosis not present

## 2021-02-20 ENCOUNTER — Ambulatory Visit (INDEPENDENT_AMBULATORY_CARE_PROVIDER_SITE_OTHER): Payer: Medicare HMO | Admitting: Clinical

## 2021-02-20 ENCOUNTER — Other Ambulatory Visit: Payer: Self-pay

## 2021-02-20 DIAGNOSIS — F331 Major depressive disorder, recurrent, moderate: Secondary | ICD-10-CM | POA: Diagnosis not present

## 2021-02-20 DIAGNOSIS — F419 Anxiety disorder, unspecified: Secondary | ICD-10-CM | POA: Diagnosis not present

## 2021-02-20 NOTE — Progress Notes (Signed)
Virtual Visit via Telephone Note  I connected with Dale Bender on 02/20/21 at 11:00 AM EDT by telephone and verified that I am speaking with the correct person using two identifiers.  Location: Patient: Home Provider: Office   I discussed the limitations, risks, security and privacy concerns of performing an evaluation and management service by telephone and the availability of in person appointments. I also discussed with the patient that there may be a patient responsible charge related to this service. The patient expressed understanding and agreed to proceed.   THERAPIST PROGRESS NOTE   Session Time: 11:00AM-11:25AM   Participation Level: Active   Behavioral Response: CasualAlertImprovedMood   Type of Therapy: Individual Therapy   Treatment Goals addressed: Coping   Interventions: CBT, Solution Focused, Strength-based, Supportive and Social Skills Training   Summary: Dale Bender is a 50 y.o. male who presents with Depression and Anxiety . The OPT therapist worked with the patient for his ongoing treatment. The OPT therapist utilized Motivational Interviewing to assist in creating therapeutic repore. The patient in the session was engaged and work in collaboration giving feedback about his triggers and symptoms over the past few weeks including ongoing management of back pain. The OPT therapist utilized Cognitive Behavioral Therapy through cognitive restructuring as well as worked with the patient on coping strategies to assist in management of mood, improve coping, and improve positive thinking while empowering the patient to be active and not self isolate.The OPT therapist examined upcoming health appointments and continued to encourage the patient to stay consistent and compliant with all medical appointments and recommendations.The patient in this session noted he still has been Fishing yet but has been more active over the past holiday and with his involvement in the  community.   Suicidal/Homicidal: Nowithout intent/plan   Therapist Response: The OPT therapist worked with the patient for the patients  scheduled session. The patient was engaged in his session and gave feedback in relation to triggers, symptoms, and behavior responses over the past few weeks. The OPT therapist worked with the patient utilizing an in session Cognitive Behavioral Therapy exercise. The patient was responsive in the session and verbalized, " I don't drive so I am still waiting to get with my family to pick me up we have been looking at a few spots that might be good potentials to go Fishing ". The  patient reports improvement in mood/ mobility/ interaction/ and activity over the course of the past few weeks.The OPT therapist will continue treatment work with the patient in his next scheduled session.     Plan: Return again in 2/3 weeks.   Diagnosis:      Axis I: Recurrent Depression with Anxiety                         Axis II: No diagnosis   I discussed the assessment and treatment plan with the patient. The patient was provided an opportunity to ask questions and all were answered. The patient agreed with the plan and demonstrated an understanding of the instructions.   The patient was advised to call back or seek an in-person evaluation if the symptoms worsen or if the condition fails to improve as anticipated.   I provided 25 minutes of non-face-to-face time during this encounter.   [name], LCSW   02/20/2021

## 2021-02-22 DIAGNOSIS — R030 Elevated blood-pressure reading, without diagnosis of hypertension: Secondary | ICD-10-CM | POA: Diagnosis not present

## 2021-02-22 DIAGNOSIS — M47816 Spondylosis without myelopathy or radiculopathy, lumbar region: Secondary | ICD-10-CM | POA: Diagnosis not present

## 2021-02-22 DIAGNOSIS — Z6835 Body mass index (BMI) 35.0-35.9, adult: Secondary | ICD-10-CM | POA: Diagnosis not present

## 2021-02-28 DIAGNOSIS — M47816 Spondylosis without myelopathy or radiculopathy, lumbar region: Secondary | ICD-10-CM | POA: Diagnosis not present

## 2021-03-13 ENCOUNTER — Other Ambulatory Visit: Payer: Self-pay

## 2021-03-13 ENCOUNTER — Ambulatory Visit (INDEPENDENT_AMBULATORY_CARE_PROVIDER_SITE_OTHER): Payer: Medicare HMO | Admitting: Clinical

## 2021-03-13 DIAGNOSIS — F419 Anxiety disorder, unspecified: Secondary | ICD-10-CM | POA: Diagnosis not present

## 2021-03-13 DIAGNOSIS — F331 Major depressive disorder, recurrent, moderate: Secondary | ICD-10-CM | POA: Diagnosis not present

## 2021-03-13 NOTE — Progress Notes (Signed)
Virtual Visit via Telephone Note   I connected with Dale Bender on 03/13/21 at 11:00 AM EDT by telephone and verified that I am speaking with the correct person using two identifiers.   Location: Patient: Home Provider: Office   I discussed the limitations, risks, security and privacy concerns of performing an evaluation and management service by telephone and the availability of in person appointments. I also discussed with the patient that there may be a patient responsible charge related to this service. The patient expressed understanding and agreed to proceed.     THERAPIST PROGRESS NOTE   Session Time: 11:00AM-11:25AM   Participation Level: Active   Behavioral Response: CasualAlertImprovedMood   Type of Therapy: Individual Therapy   Treatment Goals addressed: Coping   Interventions: CBT, Solution Focused, Strength-based, Supportive and Social Skills Training   Summary: Dale Bender is a 50 y.o. male who presents with Depression and Anxiety . The OPT therapist worked with the patient for his ongoing treatment. The OPT therapist utilized Motivational Interviewing to assist in creating therapeutic repore. The patient in the session was engaged and work in collaboration giving feedback about his triggers and symptoms over the past few weeks. The patient continues ongoing management of back pain. The OPT therapist utilized Cognitive Behavioral Therapy through cognitive restructuring as well as worked with the patient on coping strategies to assist in management of mood, improve coping, and improve positive thinking while empowering the patient to be active and not self isolate.The OPT therapist examined upcoming health appointments and continued to encourage the patient to stay consistent and compliant with all medical appointments and recommendations.The patient in this session noted he has been much more active with involvement in the community and has gone fishing with his  pastor and has changed his perspective and now looks forward to social events and leisure activities.   Suicidal/Homicidal: Nowithout intent/plan   Therapist Response: The OPT therapist worked with the patient for the patients  scheduled session. The patient was engaged in his session and gave feedback in relation to triggers, symptoms, and behavior responses over the past few weeks. The OPT therapist worked with the patient utilizing an in session Cognitive Behavioral Therapy exercise. The patient was responsive in the session and verbalized, " Everything now is going really good and it has been going good for awhile I am just thankful for your help and I am good now and ready to Discharge ". The  patient reports  sustained improvement in mood/ mobility/ interaction/ and activity over the course of the past few weeks.The OPT therapist agreed with the patients consistency over the past month with his formal successful discharge with counseling services.    Plan: Successful Discharge.   Diagnosis:      Axis I: Recurrent Depression with Anxiety                         Axis II: No diagnosis   I discussed the assessment and treatment plan with the patient. The patient was provided an opportunity to ask questions and all were answered. The patient agreed with the plan and demonstrated an understanding of the instructions.   The patient was advised to call back or seek an in-person evaluation if the symptoms worsen or if the condition fails to improve as anticipated.   I provided 25 minutes of non-face-to-face time during this encounter.   [name], LCSW   03/13/2021

## 2021-03-14 DIAGNOSIS — F419 Anxiety disorder, unspecified: Secondary | ICD-10-CM | POA: Diagnosis not present

## 2021-03-14 DIAGNOSIS — M47816 Spondylosis without myelopathy or radiculopathy, lumbar region: Secondary | ICD-10-CM | POA: Diagnosis not present

## 2021-03-26 DIAGNOSIS — M47816 Spondylosis without myelopathy or radiculopathy, lumbar region: Secondary | ICD-10-CM | POA: Diagnosis not present

## 2021-03-26 DIAGNOSIS — Z6837 Body mass index (BMI) 37.0-37.9, adult: Secondary | ICD-10-CM | POA: Diagnosis not present

## 2021-03-26 DIAGNOSIS — R030 Elevated blood-pressure reading, without diagnosis of hypertension: Secondary | ICD-10-CM | POA: Diagnosis not present

## 2021-03-27 ENCOUNTER — Other Ambulatory Visit: Payer: Self-pay | Admitting: Neurology

## 2021-04-04 DIAGNOSIS — M47817 Spondylosis without myelopathy or radiculopathy, lumbosacral region: Secondary | ICD-10-CM | POA: Diagnosis not present

## 2021-04-04 DIAGNOSIS — M47816 Spondylosis without myelopathy or radiculopathy, lumbar region: Secondary | ICD-10-CM | POA: Diagnosis not present

## 2021-04-04 DIAGNOSIS — M47814 Spondylosis without myelopathy or radiculopathy, thoracic region: Secondary | ICD-10-CM | POA: Diagnosis not present

## 2021-04-04 DIAGNOSIS — M5126 Other intervertebral disc displacement, lumbar region: Secondary | ICD-10-CM | POA: Diagnosis not present

## 2021-04-04 DIAGNOSIS — M546 Pain in thoracic spine: Secondary | ICD-10-CM | POA: Diagnosis not present

## 2021-04-04 DIAGNOSIS — M9973 Connective tissue and disc stenosis of intervertebral foramina of lumbar region: Secondary | ICD-10-CM | POA: Diagnosis not present

## 2021-04-04 NOTE — Progress Notes (Signed)
Virtual Visit via Video Note  I connected with Dale Bender on 04/05/21 at  8:30 AM EDT by a video enabled telemedicine application and verified that I am speaking with the correct person using two identifiers.  Location: Patient: home Provider: office Persons participated in the visit- patient, provider    I discussed the limitations of evaluation and management by telemedicine and the availability of in person appointments. The patient expressed understanding and agreed to proceed.   I discussed the assessment and treatment plan with the patient. The patient was provided an opportunity to ask questions and all were answered. The patient agreed with the plan and demonstrated an understanding of the instructions.   The patient was advised to call back or seek an in-person evaluation if the symptoms worsen or if the condition fails to improve as anticipated.  I provided 18 minutes of non-face-to-face time during this encounter.   Dale Clay, MD    [facility] MD/PA/NP OP Progress Note  04/05/2021 8:59 AM Dale Bender  MRN:  [id number]  Chief Complaint:  Chief Complaint   Follow-up; Depression    HPI:  This is a follow-up appointment for depression.  He states that he has been doing "alright so far."  However, on further elaboration, he states that he has sometimes not good days.  He tends to feel more down when he has exacerbation of pain.  He also states that he "bring it on myself," referring to him over exerting himself to do household tasks.  He feels frustrated with this condition.  When he is asked about his pain, he states that it is not getting better.  He had 2 MRI, and is waiting to hear the result.  He likes going to church, and goes there regularly.  He reports good relationship with his family.  He states that he tries to take care of himself including his mood, and he declines any change in medication.  He also states that he does not want to add medication, which can  make him feel worse.  He has depressive symptoms as in PHQ-9.  Although he reports occasional passive SI, he denies any plan or intent.  He is not planning to see his therapist anymore as he thinks he can handle himself.    Daily routine: stays in the house most of the time, goes to church on Fridays, Saturdays Employment: on long term disability from his job, (currently waiting for decision from the state). He used to works at [employer] for 13 years Support: [place]: wife, his children Marital status: married  Number of children: 33 (52, 47 year old) He feels "mistreated" in his childhood by his parents. He describes it as "not like average family grouping up" as they did not show love. He later reports good relationship with his father, who deceased at age 64 many years ago. His mother is in [place]. He has estranged relationship with his brother, sister while he "don't remember" why it happened  Visit Diagnosis:    ICD-10-CM   1. MDD (major depressive disorder), recurrent episode, moderate ([place])  F33.1       Past Psychiatric History: Please see initial evaluation for full details. I have reviewed the history. No updates at this time.     Past Medical History:  Past Medical History:  Diagnosis Date   Anxiety    Arthritis    Back pain    GERD (gastroesophageal reflux disease)    History of seizures  Lower extremity weakness    Prostate nodule    Benign   Sleep apnea    Urethral stricture in male     Past Surgical History:  Procedure Laterality Date   FRACTURE SURGERY     LT arm, LT leg, Rt leg   PROSTATE BIOPSY     UMBILICAL HERNIA REPAIR N/A 12/03/2019   Procedure: HERNIA REPAIR UMBILICAL ADULT/ WITH MESH;  Surgeon: Aviva Signs, MD;  Location: [facility];  Service: General;  Laterality: N/A;   URETHRAL STRICTURE DILATATION      Family Psychiatric History: Please see initial evaluation for full details. I have reviewed the history. No updates at this time.      Family History:  Family History  Problem Relation Age of Onset   Hypertension Mother    Diabetes Mother    Other Father        died at age 68 in accident - log fell on him   Kidney cancer Maternal Aunt    Prostate cancer Paternal Uncle     Social History:  Social History   Socioeconomic History   Marital status: Married    Spouse name: Not on file   Number of children: 2   Years of education: 10th grade   Highest education level: Not on file  Occupational History   Occupation: Disability  Tobacco Use   Smoking status: Former    Packs/day: 0.50    Years: 10.00    Pack years: 5.00    Types: Cigarettes    Quit date: 12/01/2002    Years since quitting: 18.3   Smokeless tobacco: Never  Vaping Use   Vaping Use: Never used  Substance and Sexual Activity   Alcohol use: Not Currently   Drug use: Not Currently   Sexual activity: Not on file  Other Topics Concern   Not on file  Social History Narrative   Lives at home with his wife and children.   Right-handed.   2 cups caffeine per day.   Social Determinants of Health   Financial Resource Strain: Not on file  Food Insecurity: Not on file  Transportation Needs: Not on file  Physical Activity: Not on file  Stress: Not on file  Social Connections: Not on file    Allergies: No Known Allergies  Metabolic Disorder Labs: No results found for: HGBA1C, MPG No results found for: PROLACTIN Lab Results  Component Value Date   TRIG 187 (H) 08/05/2019   Lab Results  Component Value Date   TSH 1.920 11/02/2020   TSH 2.430 06/10/2019    Therapeutic Level Labs: No results found for: LITHIUM No results found for: VALPROATE No components found for:  CBMZ  Current Medications: Current Outpatient Medications  Medication Sig Dispense Refill   albuterol (VENTOLIN HFA) 108 (90 Base) MCG/ACT inhaler Inhale 2 puffs into the lungs every 6 (six) hours as needed for wheezing or shortness of breath. 8 g 0   LamoTRIgine 200 MG  TB24 24 hour tablet TAKE TWO TABLETS BY MOUTH AT BEDTIME 180 tablet 4   meloxicam (MOBIC) 7.5 MG tablet Take 7.5 mg by mouth in the morning and at bedtime.     omeprazole (PRILOSEC) 40 MG capsule Take 40 mg by mouth daily.     sertraline (ZOLOFT) 100 MG tablet Take 2 tablets (200 mg total) by mouth daily. 180 tablet 1   No current facility-administered medications for this visit.     Musculoskeletal: Strength & Muscle Tone:  N/A Gait & Station:  N/A Patient leans: N/A  Psychiatric Specialty Exam: Review of Systems  Psychiatric/Behavioral:  Positive for decreased concentration and dysphoric mood. Negative for agitation, behavioral problems, confusion, hallucinations, self-injury, sleep disturbance and suicidal ideas. The patient is not nervous/anxious and is not hyperactive.   All other systems reviewed and are negative.  There were no vitals taken for this visit.There is no height or weight on file to calculate BMI.  General Appearance: Fairly Groomed  Eye Contact:  Good  Speech:  Clear and Coherent  Volume:  Normal  Mood:  Depressed  Affect:  Appropriate, Congruent, and down  Thought Process:  Coherent  Orientation:  Full (Time, Place, and Person)  Thought Content: Logical   Suicidal Thoughts:  Yes.  without intent/plan  Homicidal Thoughts:  No  Memory:  Immediate;   Good  Judgement:  Good  Insight:  Fair  Psychomotor Activity:  Normal  Concentration:  Concentration: Good and Attention Span: Good  Recall:  Good  Fund of Knowledge: Good  Language: Good  Akathisia:  No  Handed:  Right  AIMS (if indicated): not done  Assets:  Communication Skills Desire for Improvement  ADL's:  Intact  Cognition: WNL  Sleep:  Good   Screenings: PHQ2-9    Flowsheet Row Video Visit from 04/05/2021 in [place] Video Visit from 12/07/2020 in [place] Video Visit from 10/30/2020 in [place]  PHQ-2  Total Score '5 4 3  '$ PHQ-9 Total Score '13 9 6      '$ Flowsheet Row Video Visit from 04/05/2021 in [place] Video Visit from 02/01/2021 in [place] Video Visit from 12/07/2020 in [place] Error: Q3, 4, or 5 should not be populated when Q2 is No Low Risk No Risk        Assessment and Plan:  Dale Bender is a 50 y.o. year old male with a history of depression, complex partial seizure, obstructive sleep apnea, who presents for follow up appointment for below.   1. MDD (major depressive disorder), recurrent episode, moderate ([place]) He reports slight worsening in depressive symptoms in the context of back pain/demoralization due to his medical condition, including seizure, visual changes and right leg weakness.  Other psychosocial stressors includes unemployment.  He reports good relationship with his family, and attends to church regularly.  Although he was advised to try adjunctive treatment for depression, he declined this option due to the concern of potential side effect.  He is also not interested in [place] at this time, and he has a history of seizure, which can preclude from this treatment.  Will continue sertraline to target depression.  Although he will greatly benefit from CBT, he is not interested in seeing a therapist anymore.  Will continue to discuss as needed.   Plan Continue sertraline 200 mg at night   Next appointment- 10/21 at 9:30, video 3. Reviewed TSH ; wnl   Past trials of medication: sertraline, Abilify (irritability, nausea)   Emergency resources which includes 911, ED, suicide crisis line [phone]) are discussed.   The patient demonstrates the following risk factors for suicide: Chronic risk factors for suicide include: psychiatric disorder of depression. Acute risk factors for suicide include: loss (financial, interpersonal, professional). Protective  factors for this patient include: positive social support, responsibility to others (children, family), coping skills and hope for the future. Considering these factors, the overall suicide risk at this point appears to be low.  Patient is appropriate for outpatient follow up.                Dale Clay, MD 04/05/2021, 8:59 AM

## 2021-04-05 ENCOUNTER — Other Ambulatory Visit: Payer: Self-pay

## 2021-04-05 ENCOUNTER — Telehealth (INDEPENDENT_AMBULATORY_CARE_PROVIDER_SITE_OTHER): Payer: Medicare HMO | Admitting: Psychiatry

## 2021-04-05 ENCOUNTER — Encounter: Payer: Self-pay | Admitting: Psychiatry

## 2021-04-05 DIAGNOSIS — F331 Major depressive disorder, recurrent, moderate: Secondary | ICD-10-CM | POA: Diagnosis not present

## 2021-04-05 NOTE — Patient Instructions (Signed)
Continue sertraline 200 mg at night   Next appointment- 10/21 at 9:30

## 2021-04-23 DIAGNOSIS — M47816 Spondylosis without myelopathy or radiculopathy, lumbar region: Secondary | ICD-10-CM | POA: Diagnosis not present

## 2021-04-23 DIAGNOSIS — M546 Pain in thoracic spine: Secondary | ICD-10-CM | POA: Diagnosis not present

## 2021-04-26 DIAGNOSIS — G4733 Obstructive sleep apnea (adult) (pediatric): Secondary | ICD-10-CM | POA: Diagnosis not present

## 2021-05-02 DIAGNOSIS — M5442 Lumbago with sciatica, left side: Secondary | ICD-10-CM | POA: Diagnosis not present

## 2021-05-02 DIAGNOSIS — M5441 Lumbago with sciatica, right side: Secondary | ICD-10-CM | POA: Diagnosis not present

## 2021-05-02 DIAGNOSIS — M47816 Spondylosis without myelopathy or radiculopathy, lumbar region: Secondary | ICD-10-CM | POA: Diagnosis not present

## 2021-05-02 DIAGNOSIS — M6281 Muscle weakness (generalized): Secondary | ICD-10-CM | POA: Diagnosis not present

## 2021-05-07 ENCOUNTER — Telehealth: Payer: Self-pay | Admitting: *Deleted

## 2021-05-07 NOTE — Telephone Encounter (Signed)
Order for CPAP supplies [employer] received.  To MM/NP to sign.  Made appt 07-19-21 at 0745 for annual cpap visit.

## 2021-05-08 DIAGNOSIS — M5441 Lumbago with sciatica, right side: Secondary | ICD-10-CM | POA: Diagnosis not present

## 2021-05-08 DIAGNOSIS — M6281 Muscle weakness (generalized): Secondary | ICD-10-CM | POA: Diagnosis not present

## 2021-05-08 DIAGNOSIS — M47816 Spondylosis without myelopathy or radiculopathy, lumbar region: Secondary | ICD-10-CM | POA: Diagnosis not present

## 2021-05-08 DIAGNOSIS — M5442 Lumbago with sciatica, left side: Secondary | ICD-10-CM | POA: Diagnosis not present

## 2021-05-09 ENCOUNTER — Other Ambulatory Visit: Payer: Self-pay

## 2021-05-09 ENCOUNTER — Ambulatory Visit (HOSPITAL_COMMUNITY): Payer: Medicare HMO | Admitting: Clinical

## 2021-05-10 DIAGNOSIS — M5442 Lumbago with sciatica, left side: Secondary | ICD-10-CM | POA: Diagnosis not present

## 2021-05-10 DIAGNOSIS — M5441 Lumbago with sciatica, right side: Secondary | ICD-10-CM | POA: Diagnosis not present

## 2021-05-10 DIAGNOSIS — M47816 Spondylosis without myelopathy or radiculopathy, lumbar region: Secondary | ICD-10-CM | POA: Diagnosis not present

## 2021-05-10 DIAGNOSIS — M6281 Muscle weakness (generalized): Secondary | ICD-10-CM | POA: Diagnosis not present

## 2021-05-15 DIAGNOSIS — M6281 Muscle weakness (generalized): Secondary | ICD-10-CM | POA: Diagnosis not present

## 2021-05-15 DIAGNOSIS — M5441 Lumbago with sciatica, right side: Secondary | ICD-10-CM | POA: Diagnosis not present

## 2021-05-15 DIAGNOSIS — M5442 Lumbago with sciatica, left side: Secondary | ICD-10-CM | POA: Diagnosis not present

## 2021-05-15 DIAGNOSIS — M47816 Spondylosis without myelopathy or radiculopathy, lumbar region: Secondary | ICD-10-CM | POA: Diagnosis not present

## 2021-05-17 DIAGNOSIS — M5442 Lumbago with sciatica, left side: Secondary | ICD-10-CM | POA: Diagnosis not present

## 2021-05-17 DIAGNOSIS — M6281 Muscle weakness (generalized): Secondary | ICD-10-CM | POA: Diagnosis not present

## 2021-05-17 DIAGNOSIS — M5441 Lumbago with sciatica, right side: Secondary | ICD-10-CM | POA: Diagnosis not present

## 2021-05-17 DIAGNOSIS — M47816 Spondylosis without myelopathy or radiculopathy, lumbar region: Secondary | ICD-10-CM | POA: Diagnosis not present

## 2021-05-22 DIAGNOSIS — M5441 Lumbago with sciatica, right side: Secondary | ICD-10-CM | POA: Diagnosis not present

## 2021-05-22 DIAGNOSIS — M5442 Lumbago with sciatica, left side: Secondary | ICD-10-CM | POA: Diagnosis not present

## 2021-05-22 DIAGNOSIS — M6281 Muscle weakness (generalized): Secondary | ICD-10-CM | POA: Diagnosis not present

## 2021-05-22 DIAGNOSIS — M47816 Spondylosis without myelopathy or radiculopathy, lumbar region: Secondary | ICD-10-CM | POA: Diagnosis not present

## 2021-05-24 DIAGNOSIS — M5442 Lumbago with sciatica, left side: Secondary | ICD-10-CM | POA: Diagnosis not present

## 2021-05-24 DIAGNOSIS — M47816 Spondylosis without myelopathy or radiculopathy, lumbar region: Secondary | ICD-10-CM | POA: Diagnosis not present

## 2021-05-24 DIAGNOSIS — M6281 Muscle weakness (generalized): Secondary | ICD-10-CM | POA: Diagnosis not present

## 2021-05-24 DIAGNOSIS — M5441 Lumbago with sciatica, right side: Secondary | ICD-10-CM | POA: Diagnosis not present

## 2021-05-30 NOTE — Progress Notes (Signed)
Virtual Visit via Video Note  I connected with Dale Bender on 06/01/21 at  9:30 AM EDT by a video enabled telemedicine application and verified that I am speaking with the correct person using two identifiers.  Location: Patient: home Provider: office Persons participated in the visit- patient, provider    I discussed the limitations of evaluation and management by telemedicine and the availability of in person appointments. The patient expressed understanding and agreed to proceed.   I discussed the assessment and treatment plan with the patient. The patient was provided an opportunity to ask questions and all were answered. The patient agreed with the plan and demonstrated an understanding of the instructions.   The patient was advised to call back or seek an in-person evaluation if the symptoms worsen or if the condition fails to improve as anticipated.  I provided 11 minutes of non-face-to-face time during this encounter.   Dale Clay, MD    [facility] MD/PA/NP OP Progress Note  06/01/2021 9:53 AM [name]  MRN:  [id number]  Chief Complaint:  Chief Complaint   Follow-up; Depression    HPI:  This is a follow-up appointment for depression.  He states that he has been doing okay.  Although he feels down when he has pain, "everything is alright" otherwise.  He enjoys going to church.  He enjoys going out with her family.  He tends to stay in the home when he has pain.  He has not been able to see a sleep specialist.  He states that although he was referred, he has not heard back from anybody.  He agrees to contact the office for update.  He sleeps better since he has started on CPAP machine.  He would like some instrument to help with this machine.  He thinks his mood will be okay if he is able to get it.  He has depressive symptoms as in PHQ-9.  He denies SI.  He denies alcohol use or drug use.   Daily routine: stays in the house most of the time, goes to church on  Fridays, Saturdays Employment: on long term disability from his job, (currently waiting for decision from the state). He used to works at [employer] for 13 years Support: Dale Bender: wife, his children Marital status: married  Number of children: 52 (48, 83 year old)  Visit Diagnosis:    ICD-10-CM   1. MDD (major depressive disorder), recurrent episode, mild (H[facility])  F33.0 sertraline (ZOLOFT) 100 MG tablet      Past Psychiatric History: Please see initial evaluation for full details. I have reviewed the history. No updates at this time.     Past Medical History:  Past Medical History:  Diagnosis Date   Anxiety    Arthritis    Back pain    GERD (gastroesophageal reflux disease)    History of seizures    Lower extremity weakness    Prostate nodule    Benign   Sleep apnea    Urethral stricture in male     Past Surgical History:  Procedure Laterality Date   FRACTURE SURGERY     LT arm, LT leg, Rt leg   PROSTATE BIOPSY     UMBILICAL HERNIA REPAIR N/A 12/03/2019   Procedure: HERNIA REPAIR UMBILICAL ADULT/ WITH MESH;  Surgeon: Aviva Signs, MD;  Location: [facility];  Service: General;  Laterality: N/A;   URETHRAL STRICTURE DILATATION      Family Psychiatric History: Please see initial evaluation for full details. I  have reviewed the history. No updates at this time.     Family History:  Family History  Problem Relation Age of Onset   Hypertension Mother    Diabetes Mother    Other Father        died at age 24 in accident - log fell on him   Kidney cancer Maternal Aunt    Prostate cancer Paternal Uncle     Social History:  Social History   Socioeconomic History   Marital status: Married    Spouse name: Not on file   Number of children: 2   Years of education: 10th grade   Highest education level: Not on file  Occupational History   Occupation: Disability  Tobacco Use   Smoking status: Former    Packs/day: 0.50    Years: 10.00    Pack years: 5.00    Types:  Cigarettes    Quit date: 12/01/2002    Years since quitting: 18.5   Smokeless tobacco: Never  Vaping Use   Vaping Use: Never used  Substance and Sexual Activity   Alcohol use: Not Currently   Drug use: Not Currently   Sexual activity: Not on file  Other Topics Concern   Not on file  Social History Narrative   Lives at home with his wife and children.   Right-handed.   2 cups caffeine per day.   Social Determinants of Health   Financial Resource Strain: Not on file  Food Insecurity: Not on file  Transportation Needs: Not on file  Physical Activity: Not on file  Stress: Not on file  Social Connections: Not on file    Allergies: No Known Allergies  Metabolic Disorder Labs: No results found for: HGBA1C, MPG No results found for: PROLACTIN Lab Results  Component Value Date   TRIG 187 (H) 08/05/2019   Lab Results  Component Value Date   TSH 1.920 11/02/2020   TSH 2.430 06/10/2019    Therapeutic Level Labs: No results found for: LITHIUM No results found for: VALPROATE No components found for:  CBMZ  Current Medications: Current Outpatient Medications  Medication Sig Dispense Refill   albuterol (VENTOLIN HFA) 108 (90 Base) MCG/ACT inhaler Inhale 2 puffs into the lungs every 6 (six) hours as needed for wheezing or shortness of breath. 8 g 0   LamoTRIgine 200 MG TB24 24 hour tablet TAKE TWO TABLETS BY MOUTH AT BEDTIME 180 tablet 4   meloxicam (MOBIC) 7.5 MG tablet Take 7.5 mg by mouth in the morning and at bedtime.     omeprazole (PRILOSEC) 40 MG capsule Take 40 mg by mouth daily.     [START ON 06/11/2021] sertraline (ZOLOFT) 100 MG tablet Take 2 tablets (200 mg total) by mouth daily. 180 tablet 0   No current facility-administered medications for this visit.     Musculoskeletal: Strength & Muscle Tone:  N/A Gait & Station:  N/A Patient leans: N/A  Psychiatric Specialty Exam: Review of Systems  Psychiatric/Behavioral:  Positive for dysphoric mood. Negative for  agitation, behavioral problems, confusion, decreased concentration, hallucinations, self-injury, sleep disturbance and suicidal ideas. The patient is not nervous/anxious and is not hyperactive.   All other systems reviewed and are negative.  There were no vitals taken for this visit.There is no height or weight on file to calculate BMI.  General Appearance: Fairly Groomed  Eye Contact:  Good  Speech:  Clear and Coherent  Volume:  Normal  Mood:   okay  Affect:  Appropriate, Congruent, and euthymic  Thought Process:  Coherent  Orientation:  Full (Time, Place, and Person)  Thought Content: Logical   Suicidal Thoughts:  No  Homicidal Thoughts:  No  Memory:  Immediate;   Good  Judgement:  Good  Insight:  Good  Psychomotor Activity:  Normal  Concentration:  Concentration: Good and Attention Span: Good  Recall:  Good  Fund of Knowledge: Good  Language: Good  Akathisia:  No  Handed:  Right  AIMS (if indicated): not done  Assets:  Communication Skills Desire for Improvement  ADL's:  Intact  Cognition: WNL  Sleep:  Fair   Screenings: PHQ2-9    Flowsheet Row Video Visit from 06/01/2021 in [place] Video Visit from 04/05/2021 in [place] Video Visit from 12/07/2020 in [place] Video Visit from 10/30/2020 in [place]  PHQ-2 Total Score 2 5 4 3   PHQ-9 Total Score 5 13 9 6       Flowsheet Row Video Visit from 06/01/2021 in [place] Video Visit from 04/05/2021 in [place] Video Visit from 02/01/2021 in [place] Error: Question 1 not populated Error: Q3, 4, or 5 should not be populated when Q2 is No Low Risk        Assessment and Plan:  WAYLEN DEPAOLO is a 50 y.o. year old male with a history of depression, complex partial seizure, obstructive  sleep apnea on CPAP, who presents for follow up appointment for below.   1. MDD (major depressive disorder), recurrent episode, mild (H[facility]) There has been overall improvement in depressive symptoms since the last visit.  Psychosocial stressors includes back pain,  demoralization due to his physical condition including seizure, visual changes and right leg weakness, and unemployment.  He reports good relationship with his family, and enjoys going to church regularly.  Will continue current dose of sertraline to target depression.  Although he will greatly benefit from CBT, he is not interested in seeing a therapist anymore.  We will continue to discuss as needed.    Plan Continue sertraline 200 mg at night   Next appointment- 1/19 at 8:40, video 3. Reviewed TSH ; wnl   Past trials of medication: sertraline, Abilify (irritability, nausea)   Emergency resources which includes 911, ED, suicide crisis line [phone]) are discussed.   The patient demonstrates the following risk factors for suicide: Chronic risk factors for suicide include: psychiatric disorder of depression. Acute risk factors for suicide include: loss (financial, interpersonal, professional). Protective factors for this patient include: positive social support, responsibility to others (children, family), coping skills and hope for the future. Considering these factors, the overall suicide risk at this point appears to be low. Patient is appropriate for outpatient follow up.      Dale Clay, MD 06/01/2021, 9:53 AM

## 2021-05-31 DIAGNOSIS — M5441 Lumbago with sciatica, right side: Secondary | ICD-10-CM | POA: Diagnosis not present

## 2021-05-31 DIAGNOSIS — M6281 Muscle weakness (generalized): Secondary | ICD-10-CM | POA: Diagnosis not present

## 2021-05-31 DIAGNOSIS — M5442 Lumbago with sciatica, left side: Secondary | ICD-10-CM | POA: Diagnosis not present

## 2021-05-31 DIAGNOSIS — M47816 Spondylosis without myelopathy or radiculopathy, lumbar region: Secondary | ICD-10-CM | POA: Diagnosis not present

## 2021-06-01 ENCOUNTER — Encounter: Payer: Self-pay | Admitting: Psychiatry

## 2021-06-01 ENCOUNTER — Telehealth (INDEPENDENT_AMBULATORY_CARE_PROVIDER_SITE_OTHER): Payer: Medicare HMO | Admitting: Psychiatry

## 2021-06-01 ENCOUNTER — Other Ambulatory Visit: Payer: Self-pay

## 2021-06-01 DIAGNOSIS — F330 Major depressive disorder, recurrent, mild: Secondary | ICD-10-CM

## 2021-06-01 MED ORDER — SERTRALINE HCL 100 MG PO TABS
200.0000 mg | ORAL_TABLET | Freq: Every day | ORAL | 0 refills | Status: DC
Start: 2021-06-11 — End: 2021-08-30

## 2021-06-01 NOTE — Patient Instructions (Signed)
Continue sertraline 200 mg at night   Next appointment- 1/19 at 8:40

## 2021-06-06 DIAGNOSIS — M47816 Spondylosis without myelopathy or radiculopathy, lumbar region: Secondary | ICD-10-CM | POA: Diagnosis not present

## 2021-06-06 DIAGNOSIS — G8929 Other chronic pain: Secondary | ICD-10-CM | POA: Diagnosis not present

## 2021-06-06 DIAGNOSIS — M2569 Stiffness of other specified joint, not elsewhere classified: Secondary | ICD-10-CM | POA: Diagnosis not present

## 2021-06-06 DIAGNOSIS — R531 Weakness: Secondary | ICD-10-CM | POA: Diagnosis not present

## 2021-06-06 DIAGNOSIS — R2689 Other abnormalities of gait and mobility: Secondary | ICD-10-CM | POA: Diagnosis not present

## 2021-06-06 DIAGNOSIS — M546 Pain in thoracic spine: Secondary | ICD-10-CM | POA: Diagnosis not present

## 2021-06-06 DIAGNOSIS — R29898 Other symptoms and signs involving the musculoskeletal system: Secondary | ICD-10-CM | POA: Diagnosis not present

## 2021-06-06 DIAGNOSIS — M5440 Lumbago with sciatica, unspecified side: Secondary | ICD-10-CM | POA: Diagnosis not present

## 2021-06-06 DIAGNOSIS — Z9181 History of falling: Secondary | ICD-10-CM | POA: Diagnosis not present

## 2021-06-08 DIAGNOSIS — M47816 Spondylosis without myelopathy or radiculopathy, lumbar region: Secondary | ICD-10-CM | POA: Diagnosis not present

## 2021-06-08 DIAGNOSIS — R2689 Other abnormalities of gait and mobility: Secondary | ICD-10-CM | POA: Diagnosis not present

## 2021-06-08 DIAGNOSIS — M546 Pain in thoracic spine: Secondary | ICD-10-CM | POA: Diagnosis not present

## 2021-06-08 DIAGNOSIS — M2569 Stiffness of other specified joint, not elsewhere classified: Secondary | ICD-10-CM | POA: Diagnosis not present

## 2021-06-08 DIAGNOSIS — G8929 Other chronic pain: Secondary | ICD-10-CM | POA: Diagnosis not present

## 2021-06-08 DIAGNOSIS — M5440 Lumbago with sciatica, unspecified side: Secondary | ICD-10-CM | POA: Diagnosis not present

## 2021-06-08 DIAGNOSIS — R29898 Other symptoms and signs involving the musculoskeletal system: Secondary | ICD-10-CM | POA: Diagnosis not present

## 2021-06-08 DIAGNOSIS — R531 Weakness: Secondary | ICD-10-CM | POA: Diagnosis not present

## 2021-06-08 DIAGNOSIS — Z9181 History of falling: Secondary | ICD-10-CM | POA: Diagnosis not present

## 2021-06-19 DIAGNOSIS — R531 Weakness: Secondary | ICD-10-CM | POA: Diagnosis not present

## 2021-06-19 DIAGNOSIS — G8929 Other chronic pain: Secondary | ICD-10-CM | POA: Diagnosis not present

## 2021-06-19 DIAGNOSIS — Z9181 History of falling: Secondary | ICD-10-CM | POA: Diagnosis not present

## 2021-06-19 DIAGNOSIS — M5440 Lumbago with sciatica, unspecified side: Secondary | ICD-10-CM | POA: Diagnosis not present

## 2021-06-19 DIAGNOSIS — M47816 Spondylosis without myelopathy or radiculopathy, lumbar region: Secondary | ICD-10-CM | POA: Diagnosis not present

## 2021-06-19 DIAGNOSIS — M546 Pain in thoracic spine: Secondary | ICD-10-CM | POA: Diagnosis not present

## 2021-06-19 DIAGNOSIS — M2569 Stiffness of other specified joint, not elsewhere classified: Secondary | ICD-10-CM | POA: Diagnosis not present

## 2021-06-19 DIAGNOSIS — R29898 Other symptoms and signs involving the musculoskeletal system: Secondary | ICD-10-CM | POA: Diagnosis not present

## 2021-06-19 DIAGNOSIS — R2689 Other abnormalities of gait and mobility: Secondary | ICD-10-CM | POA: Diagnosis not present

## 2021-06-21 DIAGNOSIS — Z9181 History of falling: Secondary | ICD-10-CM | POA: Diagnosis not present

## 2021-06-21 DIAGNOSIS — M47816 Spondylosis without myelopathy or radiculopathy, lumbar region: Secondary | ICD-10-CM | POA: Diagnosis not present

## 2021-06-21 DIAGNOSIS — M2569 Stiffness of other specified joint, not elsewhere classified: Secondary | ICD-10-CM | POA: Diagnosis not present

## 2021-06-21 DIAGNOSIS — M546 Pain in thoracic spine: Secondary | ICD-10-CM | POA: Diagnosis not present

## 2021-06-21 DIAGNOSIS — G8929 Other chronic pain: Secondary | ICD-10-CM | POA: Diagnosis not present

## 2021-06-21 DIAGNOSIS — M5440 Lumbago with sciatica, unspecified side: Secondary | ICD-10-CM | POA: Diagnosis not present

## 2021-06-21 DIAGNOSIS — R531 Weakness: Secondary | ICD-10-CM | POA: Diagnosis not present

## 2021-06-21 DIAGNOSIS — R2689 Other abnormalities of gait and mobility: Secondary | ICD-10-CM | POA: Diagnosis not present

## 2021-06-21 DIAGNOSIS — R29898 Other symptoms and signs involving the musculoskeletal system: Secondary | ICD-10-CM | POA: Diagnosis not present

## 2021-07-13 DIAGNOSIS — M545 Low back pain, unspecified: Secondary | ICD-10-CM | POA: Diagnosis not present

## 2021-07-13 DIAGNOSIS — E785 Hyperlipidemia, unspecified: Secondary | ICD-10-CM | POA: Diagnosis not present

## 2021-07-13 DIAGNOSIS — Z23 Encounter for immunization: Secondary | ICD-10-CM | POA: Diagnosis not present

## 2021-07-13 DIAGNOSIS — G40909 Epilepsy, unspecified, not intractable, without status epilepticus: Secondary | ICD-10-CM | POA: Diagnosis not present

## 2021-07-13 DIAGNOSIS — Z6836 Body mass index (BMI) 36.0-36.9, adult: Secondary | ICD-10-CM | POA: Diagnosis not present

## 2021-07-13 DIAGNOSIS — R5383 Other fatigue: Secondary | ICD-10-CM | POA: Diagnosis not present

## 2021-07-13 DIAGNOSIS — Z0000 Encounter for general adult medical examination without abnormal findings: Secondary | ICD-10-CM | POA: Diagnosis not present

## 2021-07-17 DIAGNOSIS — R911 Solitary pulmonary nodule: Secondary | ICD-10-CM | POA: Diagnosis not present

## 2021-07-17 DIAGNOSIS — Z8701 Personal history of pneumonia (recurrent): Secondary | ICD-10-CM | POA: Diagnosis not present

## 2021-07-17 DIAGNOSIS — R590 Localized enlarged lymph nodes: Secondary | ICD-10-CM | POA: Diagnosis not present

## 2021-07-18 NOTE — Progress Notes (Deleted)
Subjective:    Patient ID: Dale Bender is a 50 y.o. male.  HPI {Common ambulatory SmartLinks:[id number]}  Review of Systems  Objective:  Neurological Exam  Physical Exam  Assessment:   ***  Plan:   ***

## 2021-07-19 ENCOUNTER — Ambulatory Visit (INDEPENDENT_AMBULATORY_CARE_PROVIDER_SITE_OTHER): Payer: Medicare HMO | Admitting: Neurology

## 2021-07-19 ENCOUNTER — Encounter: Payer: Self-pay | Admitting: Neurology

## 2021-07-19 VITALS — BP 118/77 | HR 68 | Ht 73.0 in | Wt 276.4 lb

## 2021-07-19 DIAGNOSIS — G4733 Obstructive sleep apnea (adult) (pediatric): Secondary | ICD-10-CM

## 2021-07-19 NOTE — Progress Notes (Signed)
Subjective:    Patient ID: Dale Bender is a 50 y.o. male.  HPI    Interim history:   Mr. Dale Bender is a 50 year old right-handed gentleman with an underlying reflux disease, urethral stricture, seizure disorder (for which he is followed by Dr. Krista Blue), history of shortness of breath, arthritis, chronic back pain, reflux disease, prostate nodule, and obesity, who presents for follow-up consultation of his obstructive sleep apnea.  I first met him at the request of his cardiologist on 05/26/2019, at which time he reported a prior diagnosis of obstructive sleep apnea but he was not on CPAP therapy at the time any longer.  He was advised to proceed with sleep testing.  He had a baseline sleep study on 06/20/2019 and a titration study on 07/16/2019.  His baseline sleep study showed severe obstructive sleep apnea and central sleep apnea.  His AHI was 78.7/h, O2 nadir 69%.  He was started on BiPAP ST of 20/15 cm with a rate of 16/min. He was last seen in sleep clinic by Ward Givens, NP on 11/17/2019, at which time he was compliant with his BiPAP.  His residual AHI was good as well.  Today, 07/19/2021: I reviewed his BiPAP compliance data from 06/19/2021 through 07/18/2021, which is a total of 30 days, during which time he used his machine 29 days with percent use days greater than 4 hours at 93%, indicating excellent compliance with an average usage of 7 hours and 3 minutes, residual AHI at goal at 1.4/h, leak on the low side with a 95th percentile at 1.8 L/min, pressure of 20/15 centimeters with a rate of 68/min.  He reports still feeling sleepy during the day and fatigue.  Of note, he takes sertraline 200 mg at bedtime and Lamictal 200 mg at bedtime.  He is advised to talk to his psychiatrist about changing his medication potentially if it is feasible.  He does state that they tried a new medication recently and he only took it for 1 day and had a reaction to it including changes in mood and vomiting.  He is  up-to-date with his BiPAP supplies.  He is able to tolerate the pressure.  He uses a full facemask.  He does have some residual leak and snoring but I explained to him that his leak is actually rather small when I look at the download, especially considering the fact that he uses a fullface mask and the pressure is quite high.  He reports that he does not currently drive.  Previously:   05/26/19: (He) reports snoring and excessive daytime somnolence.  I reviewed your office note from 05/13/2019.  He had a cardiac perfusion test on 05/25/2019.  Results were benign.  He is scheduled for an echocardiogram this month.  He had a prior sleep study several years ago, results are not available for my review today.  He reports that he had a diagnosis of severe obstructive sleep apnea.  He had a CPAP machine but stopped it about 5 or 6 years ago and the machine was given away.  He would be willing to get retested and consider CPAP therapy again.  His bedtime is around 930 and rise time round 6.  He has nocturia about 3 times per average night and reports occasional morning headaches.  Epworth sleepiness score is 22 out of 24 today, fatigue severity score is 52 out of 63.  He used to work for [employer] and is currently not working.  He drinks caffeine in  the form of coffee, 2 cups/day on average, typically no soda currently, no alcohol currently, and quit smoking about 20 years ago.  He is trying to lose weight.  He lives with his wife and 2 children, ages 69 and 85.  He has no family history of OSA.  His Past Medical History Is Significant For: Past Medical History:  Diagnosis Date   Anxiety    Arthritis    Back pain    GERD (gastroesophageal reflux disease)    History of seizures    Lower extremity weakness    Prostate nodule    Benign   Sleep apnea    Urethral stricture in male     His Past Surgical History Is Significant For: Past Surgical History:  Procedure Laterality Date   FRACTURE  SURGERY     LT arm, LT leg, Rt leg   PROSTATE BIOPSY     UMBILICAL HERNIA REPAIR N/A 12/03/2019   Procedure: HERNIA REPAIR UMBILICAL ADULT/ WITH MESH;  Surgeon: Aviva Signs, MD;  Location: [facility];  Service: General;  Laterality: N/A;   URETHRAL STRICTURE DILATATION      His Family History Is Significant For: Family History  Problem Relation Age of Onset   Hypertension Mother    Diabetes Mother    Other Father        died at age 57 in accident - log fell on him   Sleep apnea Sister    Kidney cancer Maternal Aunt    Prostate cancer Paternal Uncle     His Social History Is Significant For: Social History   Socioeconomic History   Marital status: Married    Spouse name: Not on file   Number of children: 2   Years of education: 10th grade   Highest education level: Not on file  Occupational History   Occupation: Disability  Tobacco Use   Smoking status: Former    Packs/day: 0.50    Years: 10.00    Pack years: 5.00    Types: Cigarettes    Quit date: 12/01/2002    Years since quitting: 18.6   Smokeless tobacco: Never  Vaping Use   Vaping Use: Never used  Substance and Sexual Activity   Alcohol use: Not Currently   Drug use: Not Currently   Sexual activity: Not on file  Other Topics Concern   Not on file  Social History Narrative   Lives at home with his wife and children.   Right-handed.   2 cups caffeine per day.   Social Determinants of Health   Financial Resource Strain: Not on file  Food Insecurity: Not on file  Transportation Needs: Not on file  Physical Activity: Not on file  Stress: Not on file  Social Connections: Not on file    His Allergies Are:  No Known Allergies:   His Current Medications Are:  Outpatient Encounter Medications as of 07/19/2021  Medication Sig   albuterol (VENTOLIN HFA) 108 (90 Base) MCG/ACT inhaler Inhale 2 puffs into the lungs every 6 (six) hours as needed for wheezing or shortness of breath.   LamoTRIgine 200 MG TB24 24  hour tablet TAKE TWO TABLETS BY MOUTH AT BEDTIME   meloxicam (MOBIC) 7.5 MG tablet Take 7.5 mg by mouth in the morning and at bedtime.   omeprazole (PRILOSEC) 40 MG capsule Take 40 mg by mouth daily.   sertraline (ZOLOFT) 100 MG tablet Take 2 tablets (200 mg total) by mouth daily.   No facility-administered encounter medications on file as  of 07/19/2021.  :  Review of Systems:  Out of a complete 14 point review of systems, all are reviewed and negative with the exception of these symptoms as listed below:  Review of Systems  Neurological:        Pt is here for CPAP follow up visit. Pt states he dozes off a lot . Pt states he is still fatigue throughout the day. Pt states he has hypertension. Pt states he still snores a lot at night. Pt states the head gear is bothering him and he feels he has air coming out the mask .   ESS:20 FSS:60   Objective:  Neurological Exam  Physical Exam Physical Examination:   Vitals:   07/19/21 0735  BP: 118/77  Pulse: 68    General Examination: The patient is a very pleasant 50 y.o. male in no acute distress. He appears well-developed and well-nourished and well groomed.   HEENT: Normocephalic, atraumatic, pupils are equal, round and reactive to light and accommodation. Extraocular tracking is preserved, face is symmetric with normal facial animation, speech is clear without dysarthria, hypophonia or voice tremor.  Neck is supple, neck circumference is 18 inches.  He has no carotid bruits.  Airway examination reveals moderate mouth dryness, adequate dental hygiene, mild airway crowding secondary to tonsillar size of 1+, larger tongue, Mallampati is class II, tongue protrudes centrally in palate elevates symmetrically.He has a mild overbite.   Chest: Clear to auscultation without wheezing, rhonchi or crackles noted.   Heart: S1+S2+0, regular and normal without murmurs, rubs or gallops noted.    Abdomen: Soft, non-tender and non-distended with normal  bowel sounds appreciated on auscultation.   Extremities: There is no pitting edema in the distal lower extremities bilaterally.   Skin: Warm and dry without trophic changes noted.   Musculoskeletal: exam reveals no obvious joint deformities, tenderness or joint swelling or erythema.    Neurologically:  Mental status: The patient is awake, alert and oriented in all 4 spheres. His immediate and remote memory, attention, language skills and fund of knowledge are appropriate. There is no evidence of aphasia, agnosia, apraxia or anomia. Speech is clear with normal prosody and enunciation. Thought process is linear. Mood is normal and affect is normal.  Cranial nerves II - XII are as described above under HEENT exam. In addition: shoulder shrug is normal with equal shoulder height noted. Motor exam: Normal bulk, strength and tone is notedIn the upper extremities, he has right hip flexor weakness but with giveaway weakness noted. There is no UE or LE Resting tremor, no action tremor in the upper extremities. Reflexes are 1-2+Throughout. Fine motor skills and coordination:Grossly intact in the upper extremities.  He has no dysmetria or intention tremor with upper extremities, sensory exam is intact to light touch.He is situated in a wheelchair.   Assessment and Plan:  In summary, NIKKO GOLDWIRE is a very pleasant 50 year old male ith an underlying reflux disease, urethral stricture, seizure disorder (for which he is followed by Dr. Krista Blue), history of shortness of breath, arthritis, chronic back pain, reflux disease, prostate nodule, and obesity, who presents for follow-up consultation of his severe complex sleep disordered breathing, on BiPAP ST.  He is compliant with treatment and his residual AHI is very good.  He is highly commended for his treatment adherence.  He is up-to-date with his supplies.  He has residual sleepiness which is likely from medication effect.  He is encouraged to talk to his  prescribing physicians about this  again.  Unfortunately, it is not often easy to switch psychotropic medications or seizure medications, I explained this to him and he understands.  He is willing to continue with the BiPAP at the current settings.  He does have residual snoring but I explained to him that his pressure is already quite high, I would not favor changing the settings at this time as he is download looks so good.  He is willing to continue at the current pressure and with the current supplies.  He is typically up-to-date with his supplies as well, changes the filter on a regular basis and gets the headgear about every 2 months.  He is advised to follow-up in sleep clinic to see one of our nurse practitioners in 1 year.  I answered all his questions and he was in agreement.  I spent 30 minutes in total face-to-face time and in reviewing records during pre-charting, more than 50% of which was spent in counseling and coordination of care, reviewing test results, reviewing medications and treatment regimen and/or in discussing or reviewing the diagnosis of OSA, complex sleep apnea on BiPAP, the prognosis and treatment options. Pertinent laboratory and imaging test results that were available during this visit with the patient were reviewed by me and considered in my medical decision making (see chart for details).

## 2021-07-19 NOTE — Patient Instructions (Signed)
Please continue using your BiPAP regularly. While your insurance requires that you use BiPAP at least 4 hours each night on 70% of the nights, I recommend, that you not skip any nights and use it throughout the night if you can. Getting used to BiPAP and staying with the treatment long term does take time and patience and discipline. Untreated obstructive sleep apnea when it is moderate to severe can have an adverse impact on cardiovascular health and raise her risk for heart disease, arrhythmias, hypertension, congestive heart failure, stroke and diabetes. Untreated obstructive sleep apnea causes sleep disruption, nonrestorative sleep, and sleep deprivation. This can have an impact on your day to day functioning and cause daytime sleepiness and impairment of cognitive function, memory loss, mood disturbance, and problems focussing. Using BiPAP regularly can improve these symptoms. Your compliance looks excellent, you can follow-up in 1 year to see one of our nurse practitioners for your sleep apnea.  I would not like to change the pressure settings as your numbers look great.  Your sleepiness is likely from taking high-dose sertraline and also Lamictal.

## 2021-08-28 NOTE — Progress Notes (Signed)
Virtual Visit via Video Note  I connected with Dale Bender on 08/30/21 at  8:40 AM EST by a video enabled telemedicine application and verified that I am speaking with the correct person using two identifiers.  Location: Patient: home Provider: office Persons participated in the visit- patient, provider    I discussed the limitations of evaluation and management by telemedicine and the availability of in person appointments. The patient expressed understanding and agreed to proceed.    I discussed the assessment and treatment plan with the patient. The patient was provided an opportunity to ask questions and all were answered. The patient agreed with the plan and demonstrated an understanding of the instructions.   The patient was advised to call back or seek an in-person evaluation if the symptoms worsen or if the condition fails to improve as anticipated.  I provided 11 minutes of non-face-to-face time during this encounter.   [name], MD    [facility] MD/PA/NP OP Progress Note  08/30/2021 9:04 AM Dale Bender  MRN:  [id number]  Chief Complaint:  Chief Complaint   Depression; Follow-up    HPI:  - Hhe was seen by Dr. Rexene Alberts for OSA.  "He is compliant with treatment and his residual AHI is very good.  He is highly commended for his treatment adherence.  He is up-to-date with his supplies.  He has residual sleepiness which is likely from medication effect. "  This is a follow-up appointment for depression.   He states that he has been doing good.  And he has "nothing to complain."  He had a good holiday with his family and his mother-in-law.  He goes to church regularly for Bible study.  Although he continues to struggle with back pain, he denies any significant interference on his activity.  He reports good relationship with his children.  He does not think he has been able to be attentive to his children at times due to his "mindset."  He sometimes thinks that he needs to  do something else.  He has depressive symptoms as in PHQ-9.  He denies SI.  He reports drowsiness during the day despite sleeping good at night.  He was told by his neurologist that some of his medication might be causing this.  However, he does not feel comfortable to make any adjustment in his medication at this time.    Daily routine: stays in the house most of the time, goes to church on Fridays, Saturdays Employment: on long term disability from his job, (currently waiting for decision from the state). He used to works at [employer] for 13 years Support: [place]: wife, his children Marital status: married  Number of children: 19 (65, 35 year old)    Visit Diagnosis:    ICD-10-CM   1. MDD (major depressive disorder), recurrent episode, moderate (HCC)  F33.1 sertraline (ZOLOFT) 100 MG tablet      Past Psychiatric History: .Please see initial evaluation for full details. I have reviewed the history. No updates at this time.     Past Medical History:  Past Medical History:  Diagnosis Date   Anxiety    Arthritis    Back pain    GERD (gastroesophageal reflux disease)    History of seizures    Lower extremity weakness    Prostate nodule    Benign   Sleep apnea    Urethral stricture in male     Past Surgical History:  Procedure Laterality Date   FRACTURE SURGERY  LT arm, LT leg, Rt leg   PROSTATE BIOPSY     UMBILICAL HERNIA REPAIR N/A 12/03/2019   Procedure: HERNIA REPAIR UMBILICAL ADULT/ WITH MESH;  Surgeon: Aviva Signs, MD;  Location: [facility];  Service: General;  Laterality: N/A;   URETHRAL STRICTURE DILATATION      Family Psychiatric History: Please see initial evaluation for full details. I have reviewed the history. No updates at this time.     Family History:  Family History  Problem Relation Age of Onset   Hypertension Mother    Diabetes Mother    Other Father        died at age 32 in accident - log fell on him   Sleep apnea Sister    Kidney cancer  Maternal Aunt    Prostate cancer Paternal Uncle     Social History:  Social History   Socioeconomic History   Marital status: Married    Spouse name: Not on file   Number of children: 2   Years of education: 10th grade   Highest education level: Not on file  Occupational History   Occupation: Disability  Tobacco Use   Smoking status: Former    Packs/day: 0.50    Years: 10.00    Pack years: 5.00    Types: Cigarettes    Quit date: 12/01/2002    Years since quitting: 18.7   Smokeless tobacco: Never  Vaping Use   Vaping Use: Never used  Substance and Sexual Activity   Alcohol use: Not Currently   Drug use: Not Currently   Sexual activity: Not on file  Other Topics Concern   Not on file  Social History Narrative   Lives at home with his wife and children.   Right-handed.   2 cups caffeine per day.   Social Determinants of Health   Financial Resource Strain: Not on file  Food Insecurity: Not on file  Transportation Needs: Not on file  Physical Activity: Not on file  Stress: Not on file  Social Connections: Not on file    Allergies: No Known Allergies  Metabolic Disorder Labs: No results found for: HGBA1C, MPG No results found for: PROLACTIN Lab Results  Component Value Date   TRIG 187 (H) 08/05/2019   Lab Results  Component Value Date   TSH 1.920 11/02/2020   TSH 2.430 06/10/2019    Therapeutic Level Labs: No results found for: LITHIUM No results found for: VALPROATE No components found for:  CBMZ  Current Medications: Current Outpatient Medications  Medication Sig Dispense Refill   albuterol (VENTOLIN HFA) 108 (90 Base) MCG/ACT inhaler Inhale 2 puffs into the lungs every 6 (six) hours as needed for wheezing or shortness of breath. 8 g 0   LamoTRIgine 200 MG TB24 24 hour tablet TAKE TWO TABLETS BY MOUTH AT BEDTIME 180 tablet 4   meloxicam (MOBIC) 7.5 MG tablet Take 7.5 mg by mouth in the morning and at bedtime.     omeprazole (PRILOSEC) 40 MG capsule  Take 40 mg by mouth daily.     [START ON 09/10/2021] sertraline (ZOLOFT) 100 MG tablet Take 2 tablets (200 mg total) by mouth daily. 180 tablet 0   No current facility-administered medications for this visit.     Musculoskeletal: Strength & Muscle Tone:  N/A Gait & Station:  N/A Patient leans: N/A  Psychiatric Specialty Exam: Review of Systems  Psychiatric/Behavioral:  Positive for decreased concentration, dysphoric mood and sleep disturbance. Negative for agitation, behavioral problems, confusion, hallucinations, self-injury and suicidal  ideas. The patient is nervous/anxious. The patient is not hyperactive.   All other systems reviewed and are negative.  There were no vitals taken for this visit.There is no height or weight on file to calculate BMI.  General Appearance: Fairly Groomed  Eye Contact:  Good  Speech:  Clear and Coherent  Volume:  Normal  Mood:   good  Affect:  Appropriate, Congruent, and calm  Thought Process:  Coherent  Orientation:  Full (Time, Place, and Person)  Thought Content: Logical   Suicidal Thoughts:  No  Homicidal Thoughts:  No  Memory:  Immediate;   Good  Judgement:  Good  Insight:  Good  Psychomotor Activity:  Normal  Concentration:  Concentration: Good and Attention Span: Good  Recall:  Good  Fund of Knowledge: Good  Language: Good  Akathisia:  No  Handed:  Right  AIMS (if indicated): not done  Assets:  Communication Skills Desire for Improvement  ADL's:  Intact  Cognition: WNL  Sleep:  Poor   Screenings: PHQ2-9    Flowsheet Row Video Visit from 08/30/2021 in [place] Video Visit from 06/01/2021 in [place] Video Visit from 04/05/2021 in [place] Video Visit from 12/07/2020 in [place] Video Visit from 10/30/2020 in [place]  PHQ-2 Total Score 4 2 5 4 3   PHQ-9 Total Score 10 5 13 9 6        Flowsheet Row Video Visit from 06/01/2021 in [place] Video Visit from 04/05/2021 in [place] Video Visit from 02/01/2021 in [place] Error: Question 1 not populated Error: Q3, 4, or 5 should not be populated when Q2 is No Low Risk        Assessment and Plan:  Dale Bender is a 51 y.o. year old male with a history of depression, complex partial seizure, obstructive sleep apnea on CPAP, who presents for follow up appointment for below.   1. MDD (major depressive disorder), recurrent episode, moderate ([place])  Although he continues to have depressive symptoms in the context of back pain, he has been handling things relatively well since the last visit.  Other psychosocial stressors includes demoralization due to his health condition including seizure, visual change, right leg weakness, and unemployment.  Noted that he reports drowsiness during the day, which could be attributable to sertraline.  Although it was discussed to try switching to other antidepressant, he prefers to stay on sertraline at this time.  Will continue current dose to target depression.    Plan Continue sertraline 200 mg at night   Next appointment- 4/13 at 8:40, video 3. Reviewed TSH ; wnl   Past trials of medication: sertraline, Abilify (irritability, nausea)   Emergency resources which includes 911, ED, suicide crisis line [phone]) are discussed.   The patient demonstrates the following risk factors for suicide: Chronic risk factors for suicide include: psychiatric disorder of depression. Acute risk factors for suicide include: loss (financial, interpersonal, professional). Protective factors for this patient include: positive social support, responsibility to others (children, family), coping skills and hope for the future. Considering these factors, the overall suicide risk at this point  appears to be low. Patient is appropriate for outpatient follow up.    [name], MD 08/30/2021, 9:04 AM

## 2021-08-30 ENCOUNTER — Telehealth (INDEPENDENT_AMBULATORY_CARE_PROVIDER_SITE_OTHER): Payer: Medicare HMO | Admitting: Psychiatry

## 2021-08-30 ENCOUNTER — Encounter: Payer: Self-pay | Admitting: Psychiatry

## 2021-08-30 ENCOUNTER — Other Ambulatory Visit: Payer: Self-pay

## 2021-08-30 DIAGNOSIS — F331 Major depressive disorder, recurrent, moderate: Secondary | ICD-10-CM | POA: Diagnosis not present

## 2021-08-30 MED ORDER — SERTRALINE HCL 100 MG PO TABS: 200.0000 mg | ORAL_TABLET | Freq: Every day | ORAL | 0 refills | Status: DC

## 2021-10-04 DIAGNOSIS — R569 Unspecified convulsions: Secondary | ICD-10-CM | POA: Diagnosis not present

## 2021-10-04 DIAGNOSIS — W06XXXA Fall from bed, initial encounter: Secondary | ICD-10-CM | POA: Diagnosis not present

## 2021-10-04 DIAGNOSIS — S20221A Contusion of right back wall of thorax, initial encounter: Secondary | ICD-10-CM | POA: Diagnosis not present

## 2021-10-04 DIAGNOSIS — M545 Low back pain, unspecified: Secondary | ICD-10-CM | POA: Diagnosis not present

## 2021-10-04 DIAGNOSIS — M160 Bilateral primary osteoarthritis of hip: Secondary | ICD-10-CM | POA: Diagnosis not present

## 2021-10-04 DIAGNOSIS — M549 Dorsalgia, unspecified: Secondary | ICD-10-CM | POA: Diagnosis not present

## 2021-10-04 DIAGNOSIS — S301XXA Contusion of abdominal wall, initial encounter: Secondary | ICD-10-CM | POA: Diagnosis not present

## 2021-10-04 DIAGNOSIS — W19XXXA Unspecified fall, initial encounter: Secondary | ICD-10-CM | POA: Diagnosis not present

## 2021-10-04 DIAGNOSIS — M5136 Other intervertebral disc degeneration, lumbar region: Secondary | ICD-10-CM | POA: Diagnosis not present

## 2021-10-04 DIAGNOSIS — R109 Unspecified abdominal pain: Secondary | ICD-10-CM | POA: Diagnosis not present

## 2021-10-04 DIAGNOSIS — S0990XA Unspecified injury of head, initial encounter: Secondary | ICD-10-CM | POA: Diagnosis not present

## 2021-10-04 DIAGNOSIS — M79604 Pain in right leg: Secondary | ICD-10-CM | POA: Diagnosis not present

## 2021-10-04 DIAGNOSIS — R52 Pain, unspecified: Secondary | ICD-10-CM | POA: Diagnosis not present

## 2021-10-05 DIAGNOSIS — R531 Weakness: Secondary | ICD-10-CM | POA: Diagnosis not present

## 2021-10-05 DIAGNOSIS — F445 Conversion disorder with seizures or convulsions: Secondary | ICD-10-CM | POA: Diagnosis not present

## 2021-10-05 DIAGNOSIS — M545 Low back pain, unspecified: Secondary | ICD-10-CM | POA: Diagnosis not present

## 2021-10-05 DIAGNOSIS — R109 Unspecified abdominal pain: Secondary | ICD-10-CM | POA: Diagnosis not present

## 2021-10-19 DIAGNOSIS — F445 Conversion disorder with seizures or convulsions: Secondary | ICD-10-CM | POA: Diagnosis not present

## 2021-10-19 DIAGNOSIS — Z6835 Body mass index (BMI) 35.0-35.9, adult: Secondary | ICD-10-CM | POA: Diagnosis not present

## 2021-10-19 DIAGNOSIS — R109 Unspecified abdominal pain: Secondary | ICD-10-CM | POA: Diagnosis not present

## 2021-10-19 DIAGNOSIS — M545 Low back pain, unspecified: Secondary | ICD-10-CM | POA: Diagnosis not present

## 2021-10-26 DIAGNOSIS — Z20822 Contact with and (suspected) exposure to covid-19: Secondary | ICD-10-CM | POA: Diagnosis not present

## 2021-10-26 DIAGNOSIS — F19230 Other psychoactive substance dependence with withdrawal, uncomplicated: Secondary | ICD-10-CM | POA: Diagnosis not present

## 2021-10-26 DIAGNOSIS — R12 Heartburn: Secondary | ICD-10-CM | POA: Diagnosis not present

## 2021-10-26 DIAGNOSIS — K219 Gastro-esophageal reflux disease without esophagitis: Secondary | ICD-10-CM | POA: Diagnosis not present

## 2021-10-26 DIAGNOSIS — T481X2A Poisoning by skeletal muscle relaxants [neuromuscular blocking agents], intentional self-harm, initial encounter: Secondary | ICD-10-CM | POA: Diagnosis not present

## 2021-10-26 DIAGNOSIS — Z560 Unemployment, unspecified: Secondary | ICD-10-CM | POA: Diagnosis not present

## 2021-10-26 DIAGNOSIS — G8929 Other chronic pain: Secondary | ICD-10-CM | POA: Diagnosis not present

## 2021-10-26 DIAGNOSIS — R45851 Suicidal ideations: Secondary | ICD-10-CM | POA: Diagnosis not present

## 2021-10-28 DIAGNOSIS — F333 Major depressive disorder, recurrent, severe with psychotic symptoms: Secondary | ICD-10-CM | POA: Diagnosis not present

## 2021-10-28 DIAGNOSIS — F419 Anxiety disorder, unspecified: Secondary | ICD-10-CM | POA: Diagnosis not present

## 2021-10-28 DIAGNOSIS — G4700 Insomnia, unspecified: Secondary | ICD-10-CM | POA: Diagnosis not present

## 2021-10-31 DIAGNOSIS — K219 Gastro-esophageal reflux disease without esophagitis: Secondary | ICD-10-CM | POA: Diagnosis not present

## 2021-10-31 DIAGNOSIS — F331 Major depressive disorder, recurrent, moderate: Secondary | ICD-10-CM | POA: Diagnosis not present

## 2021-10-31 DIAGNOSIS — E669 Obesity, unspecified: Secondary | ICD-10-CM | POA: Diagnosis not present

## 2021-10-31 DIAGNOSIS — F609 Personality disorder, unspecified: Secondary | ICD-10-CM | POA: Diagnosis not present

## 2021-10-31 DIAGNOSIS — G4730 Sleep apnea, unspecified: Secondary | ICD-10-CM | POA: Diagnosis not present

## 2021-10-31 DIAGNOSIS — Z6841 Body Mass Index (BMI) 40.0 and over, adult: Secondary | ICD-10-CM | POA: Diagnosis not present

## 2021-10-31 DIAGNOSIS — Z62810 Personal history of physical and sexual abuse in childhood: Secondary | ICD-10-CM | POA: Diagnosis not present

## 2021-10-31 DIAGNOSIS — G40909 Epilepsy, unspecified, not intractable, without status epilepticus: Secondary | ICD-10-CM | POA: Diagnosis not present

## 2021-10-31 DIAGNOSIS — R45851 Suicidal ideations: Secondary | ICD-10-CM | POA: Diagnosis not present

## 2021-11-07 DIAGNOSIS — F332 Major depressive disorder, recurrent severe without psychotic features: Secondary | ICD-10-CM | POA: Diagnosis not present

## 2021-11-15 DIAGNOSIS — F329 Major depressive disorder, single episode, unspecified: Secondary | ICD-10-CM | POA: Diagnosis not present

## 2021-11-19 NOTE — Progress Notes (Signed)
Virtual Visit via Video Note ? ?I connected with Dale Bender on 11/22/21 at  8:40 AM EDT by a video enabled telemedicine application and verified that I am speaking with the correct person using two identifiers. ? ?Location: ?Patient: home ?Provider: office ?Persons participated in the visit- patient, provider  ?  ?I discussed the limitations of evaluation and management by telemedicine and the availability of in person appointments. The patient expressed understanding and agreed to proceed. ?  ?I discussed the assessment and treatment plan with the patient. The patient was provided an opportunity to ask questions and all were answered. The patient agreed with the plan and demonstrated an understanding of the instructions. ?  ?The patient was advised to call back or seek an in-person evaluation if the symptoms worsen or if the condition fails to improve as anticipated. ? ?I provided 15 minutes of non-face-to-face time during this encounter. ? ? ?Dale Clay, MD ? ? ? ?BH MD/PA/NP OP Progress Note ? ?11/22/2021 9:16 AM ?Dale Bender  ?MRN:  [id number] ? ?Chief Complaint:  ?Chief Complaint  ?Patient presents with  ? Follow-up  ? Depression  ? ?HPI:  ?Per chart review, he was brought to the ED due to suicide attempt  ?Patient admits attempting to jump from moving car and overdosing on muscle relaxant in front of his wife. She was able to remove some but not all the pills from his mouth before he could swallow them. On psychiatric interview he continues to have suicidal ideation and intent, saying on the one hand that he is tired of living and hopeless that his life could improve, but on the other hand he has at least some hope that psychiatric treatment might help him. He has minor psychotic features in the form of a voice telling him "it's time for you to go" or "everything will be okay." ? ?He states that he was brought to [facility].  He had made jumping off from a car while overdosing medication as  a suicide attempt.  He states that he was told by his daughter, who is 52 year old that she does not like him anymore after he took her phone.  His wife did not agree with him.  He states that something snapped in him, and he attempted suicide.  When he is asked to reflect on his attempt, he states that he does not know.  He continues to feel passive SI, although he currently does not have any thoughts.  He denies any gun access at home.  He is unsure whether he had any hallucinations when he attempted suicide, stating that he did not care at all.  He has occasional AH of lord talking to him when he goes to church.  He denies AH or VH.  He tends to feel worse when he is by himself.  He thinks he should be working, although he cannot due to his medical condition.  Although he does not feel comfortable with group therapy, he agrees to contact [facility] again to have a therapist.  He sleeps better since using CPAP machine.  He is willing to come for in person visit next time.  ? ? ?Visit Diagnosis:  ?  ICD-10-CM   ?1. Severe episode of recurrent major depressive disorder, with psychotic features (HCC)  F33.3 sertraline (ZOLOFT) 100 MG tablet  ?  ? ? ?Past Psychiatric History: Please see initial evaluation for full details. I have reviewed the history. No updates at this time.  ?  ? ?  Past Medical History:  ?Past Medical History:  ?Diagnosis Date  ? Anxiety   ? Arthritis   ? Back pain   ? GERD (gastroesophageal reflux disease)   ? History of seizures   ? Lower extremity weakness   ? Prostate nodule   ? Benign  ? Sleep apnea   ? Urethral stricture in male   ?  ?Past Surgical History:  ?Procedure Laterality Date  ? FRACTURE SURGERY    ? LT arm, LT leg, Rt leg  ? PROSTATE BIOPSY    ? UMBILICAL HERNIA REPAIR N/A 12/03/2019  ? Procedure: HERNIA REPAIR UMBILICAL ADULT/ WITH MESH;  Surgeon: Aviva Signs, MD;  Location: [facility];  Service: General;  Laterality: N/A;  ? URETHRAL STRICTURE DILATATION    ? ? ?Family Psychiatric  History: Please see initial evaluation for full details. I have reviewed the history. No updates at this time.  ?  ? ?Family History:  ?Family History  ?Problem Relation Age of Onset  ? Hypertension Mother   ? Diabetes Mother   ? Other Father   ?     died at age 3 in accident - log fell on him  ? Sleep apnea Sister   ? Kidney cancer Maternal Aunt   ? Prostate cancer Paternal Uncle   ? ? ?Social History:  ?Social History  ? ?Socioeconomic History  ? Marital status: Married  ?  Spouse name: Not on file  ? Number of children: 2  ? Years of education: 10th grade  ? Highest education level: Not on file  ?Occupational History  ? Occupation: Disability  ?Tobacco Use  ? Smoking status: Former  ?  Packs/day: 0.50  ?  Years: 10.00  ?  Pack years: 5.00  ?  Types: Cigarettes  ?  Quit date: 12/01/2002  ?  Years since quitting: 18.9  ? Smokeless tobacco: Never  ?Vaping Use  ? Vaping Use: Never used  ?Substance and Sexual Activity  ? Alcohol use: Not Currently  ? Drug use: Not Currently  ? Sexual activity: Not on file  ?Other Topics Concern  ? Not on file  ?Social History Narrative  ? Lives at home with his wife and children.  ? Right-handed.  ? 2 cups caffeine per day.  ? ?Social Determinants of Health  ? ?Financial Resource Strain: Not on file  ?Food Insecurity: Not on file  ?Transportation Needs: Not on file  ?Physical Activity: Not on file  ?Stress: Not on file  ?Social Connections: Not on file  ? ? ?Allergies: No Known Allergies ? ?Metabolic Disorder Labs: ?No results found for: HGBA1C, MPG ?No results found for: PROLACTIN ?Lab Results  ?Component Value Date  ? TRIG 187 (H) 08/05/2019  ? ?Lab Results  ?Component Value Date  ? TSH 1.920 11/02/2020  ? TSH 2.430 06/10/2019  ? ? ?Therapeutic Level Labs: ?No results found for: LITHIUM ?No results found for: VALPROATE ?No components found for:  CBMZ ? ?Current Medications: ?Current Outpatient Medications  ?Medication Sig Dispense Refill  ? cariprazine (VRAYLAR) 1.5 MG capsule Take  1 capsule (1.5 mg total) by mouth daily. 30 capsule 1  ? albuterol (VENTOLIN HFA) 108 (90 Base) MCG/ACT inhaler Inhale 2 puffs into the lungs every 6 (six) hours as needed for wheezing or shortness of breath. 8 g 0  ? LamoTRIgine 200 MG TB24 24 hour tablet TAKE TWO TABLETS BY MOUTH AT BEDTIME 180 tablet 4  ? meloxicam (MOBIC) 7.5 MG tablet Take 7.5 mg by mouth in the morning  and at bedtime.    ? omeprazole (PRILOSEC) 40 MG capsule Take 40 mg by mouth daily.    ? [START ON 12/10/2021] sertraline (ZOLOFT) 100 MG tablet Take 2 tablets (200 mg total) by mouth daily. 180 tablet 0  ? ?No current facility-administered medications for this visit.  ? ? ? ?Musculoskeletal: ?Strength & Muscle Tone:  N/A ?Gait & Station:  N/A ?Patient leans: N/A ? ?Psychiatric Specialty Exam: ?Review of Systems  ?Psychiatric/Behavioral:  Positive for dysphoric mood, hallucinations and suicidal ideas. Negative for agitation, behavioral problems, confusion, decreased concentration, self-injury and sleep disturbance. The patient is not nervous/anxious and is not hyperactive.   ?All other systems reviewed and are negative.  ?There were no vitals taken for this visit.There is no height or weight on file to calculate BMI.  ?General Appearance: Fairly Groomed  ?Eye Contact:  Good  ?Speech:  Clear and Coherent  ?Volume:  Normal  ?Mood:  Depressed  ?Affect:  Appropriate, Congruent, and Restricted  ?Thought Process:  Coherent  ?Orientation:  Full (Time, Place, and Person)  ?Thought Content: Logical   ?Suicidal Thoughts:  Yes.  without intent/plan  ?Homicidal Thoughts:  No  ?Memory:  Immediate;   Good  ?Judgement:  Good  ?Insight:  Present  ?Psychomotor Activity:  Normal  ?Concentration:  Concentration: Good and Attention Span: Good  ?Recall:  Good  ?Fund of Knowledge: Good  ?Language: Good  ?Akathisia:  No  ?Handed:  Right  ?AIMS (if indicated): not done  ?Assets:  Communication Skills ?Desire for Improvement  ?ADL's:  Intact  ?Cognition: WNL  ?Sleep:   Fair  ? ?Screenings: ?PHQ2-9   ? ?Flowsheet Row Video Visit from 08/30/2021 in [facility] Video Visit from 06/01/2021 in [facility] Video Visit from 8

## 2021-11-20 DIAGNOSIS — R5383 Other fatigue: Secondary | ICD-10-CM | POA: Diagnosis not present

## 2021-11-20 DIAGNOSIS — Z6836 Body mass index (BMI) 36.0-36.9, adult: Secondary | ICD-10-CM | POA: Diagnosis not present

## 2021-11-20 DIAGNOSIS — M545 Low back pain, unspecified: Secondary | ICD-10-CM | POA: Diagnosis not present

## 2021-11-20 DIAGNOSIS — I10 Essential (primary) hypertension: Secondary | ICD-10-CM | POA: Diagnosis not present

## 2021-11-20 DIAGNOSIS — R1084 Generalized abdominal pain: Secondary | ICD-10-CM | POA: Diagnosis not present

## 2021-11-20 DIAGNOSIS — F331 Major depressive disorder, recurrent, moderate: Secondary | ICD-10-CM | POA: Diagnosis not present

## 2021-11-20 DIAGNOSIS — F445 Conversion disorder with seizures or convulsions: Secondary | ICD-10-CM | POA: Diagnosis not present

## 2021-11-22 ENCOUNTER — Telehealth (INDEPENDENT_AMBULATORY_CARE_PROVIDER_SITE_OTHER): Payer: Medicare HMO | Admitting: Psychiatry

## 2021-11-22 ENCOUNTER — Encounter: Payer: Self-pay | Admitting: Psychiatry

## 2021-11-22 ENCOUNTER — Telehealth: Payer: Self-pay

## 2021-11-22 DIAGNOSIS — F333 Major depressive disorder, recurrent, severe with psychotic symptoms: Secondary | ICD-10-CM | POA: Diagnosis not present

## 2021-11-22 MED ORDER — SERTRALINE HCL 100 MG PO TABS: 200.0000 mg | ORAL_TABLET | Freq: Every day | ORAL | 0 refills | Status: DC

## 2021-11-22 MED ORDER — CARIPRAZINE HCL 1.5 MG PO CAPS: 1.5000 mg | ORAL_CAPSULE | Freq: Every day | ORAL | 1 refills | Status: AC

## 2021-11-22 NOTE — Telephone Encounter (Signed)
received email that a pior auth was needed for the vraylar. ?

## 2021-11-22 NOTE — Patient Instructions (Addendum)
Continue sertraline 200 mg at night   ?Start Vraylar 1.5 mg daily ?Next appointment- 5/23 at 8 AM, in person ? ?CONTACT INFORMATION ? ?What to do if you need to get in touch with someone regarding a psychiatric issue:  ?1. EMERGENCY: For psychiatric emergencies (if you are suicidal or if there are any other safety issues) call 911 and/or go to your nearest Emergency Room immediately.  ? ?2. IF YOU NEED SOMEONE TO TALK TO RIGHT NOW: Given my clinical responsibilities, I may not be able to speak with you over the phone for a prolonged period of time.  ?A. You may always call The National Suicide Prevention Lifeline at 1-800-273-TALK 954-851-6778).  ? ?B. You may walk in to [facility]  Address: [address] [place], [place] [address], Phone: [phone].  Open 24/7, No appointment required. ? ?C. Your county of residence will also have local crisis services.  ? ?For [facility]:  ?Joy at [phone] ([place]) ? ?  ?

## 2021-11-22 NOTE — Telephone Encounter (Signed)
went online to the [email or web address] and submitted the prior auth for the vraylar .  aprroved from  08-12-21 to 08-11-22  PA Case # [id number] ?

## 2021-11-23 ENCOUNTER — Ambulatory Visit: Payer: Medicare HMO | Admitting: Internal Medicine

## 2021-12-03 DIAGNOSIS — K635 Polyp of colon: Secondary | ICD-10-CM | POA: Diagnosis not present

## 2021-12-03 DIAGNOSIS — Z1211 Encounter for screening for malignant neoplasm of colon: Secondary | ICD-10-CM | POA: Diagnosis not present

## 2021-12-03 DIAGNOSIS — D123 Benign neoplasm of transverse colon: Secondary | ICD-10-CM | POA: Diagnosis not present

## 2021-12-06 ENCOUNTER — Ambulatory Visit (INDEPENDENT_AMBULATORY_CARE_PROVIDER_SITE_OTHER): Payer: Medicare HMO | Admitting: Clinical

## 2021-12-06 DIAGNOSIS — F419 Anxiety disorder, unspecified: Secondary | ICD-10-CM

## 2021-12-06 DIAGNOSIS — F331 Major depressive disorder, recurrent, moderate: Secondary | ICD-10-CM

## 2021-12-06 NOTE — Progress Notes (Signed)
Virtual Visit via Telephone Note ? ?I connected with Dale Bender on 12/06/21 at  9:00 AM EDT by telephone and verified that I am speaking with the correct person using two identifiers. ? ?Location: ?Patient: Home ?Provider: Office ?  ?I discussed the limitations, risks, security and privacy concerns of performing an evaluation and management service by telephone and the availability of in person appointments. I also discussed with the patient that there may be a patient responsible charge related to this service. The patient expressed understanding and agreed to proceed. ? ?Comprehensive Clinical Assessment (CCA) Note ? ?12/06/2021 ?Dale Bender ?[id number] ? ?Chief Complaint:  Depression and Anxiety ?Visit Diagnosis: Recurrent Moderate Depressive Disorder with Anxiety ? ? ?CCA Screening, Triage and Referral (STR) ? ?Patient Reported Information ?How did you hear about [name]? No data recorded ?Referral name: No data recorded ?Referral phone number: No data recorded ? ?Whom do you see for routine medical problems? No data recorded ?Practice/Facility Name: No data recorded ?Practice/Facility Phone Number: No data recorded ?Name of Contact: No data recorded ?Contact Number: No data recorded ?Contact Fax Number: No data recorded ?Prescriber Name: No data recorded ?Prescriber Address (if known): No data recorded ? ?What Is the Reason for Your Visit/Call Today? No data recorded ?How Long Has This Been Causing You Problems? No data recorded ?What Do You Feel Would Help You the Most Today? No data recorded ? ?Have You Recently Been in Any Inpatient Treatment (Hospital/Detox/Crisis Center/28-Day Program)? No data recorded ?Name/Location of Program/Hospital:No data recorded ?How Long Were You There? No data recorded ?When Were You Discharged? No data recorded ? ?Have You Ever Received Services From [facility] Before? No data recorded ?Who Do You See at [facility]? No data recorded ? ?Have You Recently Had Any Thoughts  About Hurting Yourself? No data recorded ?Are You Planning to Commit Suicide/Harm Yourself At This time? No data recorded ? ?Have you Recently Had Thoughts About [place]? No data recorded ?Explanation: No data recorded ? ?Have You Used Any Alcohol or Drugs in the Past 24 Hours? No data recorded ?How Long Ago Did You Use Drugs or Alcohol? No data recorded ?What Did You Use and How Much? No data recorded ? ?Do You Currently Have a Therapist/Psychiatrist? No data recorded ?Name of Therapist/Psychiatrist: No data recorded ? ?Have You Been Recently Discharged From Any Office Practice or Programs? No data recorded ?Explanation of Discharge From Practice/Program: No data recorded ? ?  ?CCA Screening Triage Referral Assessment ?Type of Contact: No data recorded ?Is this Initial or Reassessment? No data recorded ?Date Telepsych consult ordered in [facility]:  No data recorded ?Time Telepsych consult ordered in [facility]:  No data recorded ? ?Patient Reported Information Reviewed? No data recorded ?Patient Left Without Being Seen? No data recorded ?Reason for Not Completing Assessment: No data recorded ? ?Collateral Involvement: No data recorded ? ?Does Patient Have a Stage manager Guardian? No data recorded ?Name and Contact of Legal Guardian: No data recorded ?If Minor and Not Living with Parent(s), Who has Custody? No data recorded ?Is CPS involved or ever been involved? No data recorded ?Is APS involved or ever been involved? No data recorded ? ?Patient Determined To Be At Risk for Harm To Self or Others Based on Review of Patient Reported Information or Presenting Complaint? No data recorded ?Method: No data recorded ?Availability of Means: No data recorded ?Intent: No data recorded ?Notification Required: No data recorded ?Additional Information for Danger to Others Potential: No data recorded ?  Additional Comments for Danger to Others Potential: No data recorded ?Are There Guns or Other Weapons in [place]? No  data recorded ?Types of Guns/Weapons: No data recorded ?Are These Weapons Safely Secured?                            No data recorded ?Who Could Verify You Are Able To Have These Secured: No data recorded ?Do You Have any Outstanding Charges, Pending Court Dates, Parole/Probation? No data recorded ?Contacted To Inform of Risk of Harm To Self or Others: No data recorded ? ?Location of Assessment: No data recorded ? ?Does Patient Present under Involuntary Commitment? No data recorded ?IVC Papers Initial File Date: No data recorded ? ?[place] of Residence: No data recorded ? ?Patient Currently Receiving the Following Services: No data recorded ? ?Determination of Need: No data recorded ? ?Options For Referral: No data recorded ? ? ? ?CCA Biopsychosocial ?Intake/Chief Complaint:  Mood and anxiety ? ?Current Symptoms/Problems: Mood: sleep all the time, irritable, don't feel like doing much, low energy, some difficulty with concentration, tearfulness and S/I .,    Anxiety: worries, panic attack,  physical health issues ? ? ?Patient Reported Schizophrenia/Schizoaffective Diagnosis in Past: No ? ? ?Strengths: immediate Family involveent , wants to get better, church involvement ? ?Preferences: Prefer that health issues didn't occur, prefers to be with family, prefers to go to church ? ?Abilities: Fishing ? ? ?Type of Services Patient Feels are Needed: Therapy, medication management ? ? ?Initial Clinical Notes/Concerns: Symptoms started when he was 47 after a proceduce, symptoms occur daily, symptoms are mild. The patient was sent to [facility] in [place] for S/I. The patients daughter got into a conflict with the patient and this created a mh break that made the patient feel like killing himself. The patient was admited to [facility] for 7 days. ? ? ?Mental Health Symptoms ?Depression:   ?Irritability; Tearfulness; Sleep (too much or little); Change in energy/activity; Difficulty Concentrating; Hopelessness ?  ?Duration of  Depressive symptoms:  ?Greater than two weeks ?  ?Mania:   ?N/A ?  ?Anxiety:    ?Difficulty concentrating; Irritability; Tension; Worrying; Sleep; Restlessness ?  ?Psychosis:   ?None ?  ?Duration of Psychotic symptoms: NA  ?Trauma:   ?N/A ?  ?Obsessions:   ?N/A ?  ?Compulsions:   ?N/A ?  ?Inattention:   ?N/A ?  ?Hyperactivity/Impulsivity:   ?N/A ?  ?Oppositional/Defiant Behaviors:   ?N/A ?  ?Emotional Irregularity:   ?N/A ?  ?Other Mood/Personality Symptoms:   ?N/A ?  ? ?Mental Status Exam ?Appearance and self-care  ?Stature:   ?Tall ?  ?Weight:   ?Average weight ?  ?Clothing:   ?Casual ?  ?Grooming:   ?Normal ?  ?Cosmetic use:   ?None ?  ?Posture/gait:   ?Normal ?  ?Motor activity:   ?Not Remarkable ?  ?Sensorium  ?Attention:   ?Normal ?  ?Concentration:   ?Normal ?  ?Orientation:   ?X5 ?  ?Recall/memory:   ?Defective in Short-term ?  ?Affect and Mood  ?Affect:   ?Depressed ?  ?Mood:   ?Depressed ?  ?Relating  ?Eye contact:   ?Normal ?  ?Facial expression:   ?Depressed ?  ?Attitude toward examiner:   ?Cooperative ?  ?Thought and Language  ?Speech flow:  ?Normal ?  ?Thought content:   ?Appropriate to Mood and Circumstances ?  ?Preoccupation:   ?None (N/A) ?  ?Hallucinations:   ?None (N/A) ?  ?  Organization:  Logical  ?Community education officer  ?Fund of Knowledge:   ?Average ?  ?Intelligence:   ?Above Average ?  ?Abstraction:   ?Normal ?  ?Judgement:   ?Normal ?  ?Reality Testing:   ?Adequate ?  ?Insight:   ?Good ?  ?Decision Making:   ?Normal ?  ?Social Functioning  ?Social Maturity:   ?Responsible ?  ?Social Judgement:   ?Normal ?  ?Stress  ?Stressors:   ?Illness; Family conflict (The patients conflict with his  daughter was a trigger. The patient also identified conflict with his mother/brother/ and sister.) ?  ?Coping Ability:   ?Overwhelmed ?  ?Skill Deficits:   ?None ?  ?Supports:   ?Church; Family; Friends/Service system ?  ? ? ?Religion: ?Religion/Spirituality ?Are You A Religious Person?: Yes ?How Might This  Affect Treatment?: Support ? ?Leisure/Recreation: ?Leisure / Recreation ?Do You Have Hobbies?: Yes ?Leisure and Hobbies: Fishing ? ?Exercise/Diet: ?Exercise/Diet ?Do You Exercise?: No ?Have You Gained or L

## 2021-12-07 ENCOUNTER — Encounter: Payer: Self-pay | Admitting: *Deleted

## 2021-12-11 ENCOUNTER — Other Ambulatory Visit (INDEPENDENT_AMBULATORY_CARE_PROVIDER_SITE_OTHER): Payer: Medicare HMO

## 2021-12-11 ENCOUNTER — Ambulatory Visit (INDEPENDENT_AMBULATORY_CARE_PROVIDER_SITE_OTHER): Payer: Medicare HMO | Admitting: Internal Medicine

## 2021-12-11 ENCOUNTER — Encounter: Payer: Self-pay | Admitting: Internal Medicine

## 2021-12-11 ENCOUNTER — Telehealth: Payer: Self-pay | Admitting: Internal Medicine

## 2021-12-11 VITALS — BP 116/80 | HR 72 | Resp 16 | Ht 73.0 in | Wt 275.6 lb

## 2021-12-11 DIAGNOSIS — Z1211 Encounter for screening for malignant neoplasm of colon: Secondary | ICD-10-CM

## 2021-12-11 DIAGNOSIS — G894 Chronic pain syndrome: Secondary | ICD-10-CM | POA: Diagnosis not present

## 2021-12-11 DIAGNOSIS — M5116 Intervertebral disc disorders with radiculopathy, lumbar region: Secondary | ICD-10-CM | POA: Diagnosis not present

## 2021-12-11 DIAGNOSIS — Z79899 Other long term (current) drug therapy: Secondary | ICD-10-CM | POA: Diagnosis not present

## 2021-12-11 DIAGNOSIS — R109 Unspecified abdominal pain: Secondary | ICD-10-CM

## 2021-12-11 DIAGNOSIS — K760 Fatty (change of) liver, not elsewhere classified: Secondary | ICD-10-CM

## 2021-12-11 DIAGNOSIS — Z1159 Encounter for screening for other viral diseases: Secondary | ICD-10-CM | POA: Diagnosis not present

## 2021-12-11 DIAGNOSIS — Z79891 Long term (current) use of opiate analgesic: Secondary | ICD-10-CM | POA: Diagnosis not present

## 2021-12-11 LAB — LIPASE: Lipase: 21 U/L (ref 11.0–59.0)

## 2021-12-11 LAB — CK: Total CK: 370 U/L — ABNORMAL HIGH (ref 7–232)

## 2021-12-11 LAB — HIGH SENSITIVITY CRP: CRP, High Sensitivity: 2.57 mg/L (ref 0.000–5.000)

## 2021-12-11 LAB — IBC + FERRITIN
Ferritin: 75.5 ng/mL (ref 22.0–322.0)
Iron: 94 ug/dL (ref 42–165)
Saturation Ratios: 23.5 % (ref 20.0–50.0)
TIBC: 400.4 ug/dL (ref 250.0–450.0)
Transferrin: 286 mg/dL (ref 212.0–360.0)

## 2021-12-11 MED ORDER — PLENVU 140 G PO SOLR
1.0000 | ORAL | 0 refills | Status: DC
Start: 2021-12-11 — End: 2022-01-25

## 2021-12-11 NOTE — Telephone Encounter (Signed)
I have spoken to patient's wife to advise that we have placed a Plenvu sample at the front desk for pick up. She verbalizes understanding of this information. ?

## 2021-12-11 NOTE — Telephone Encounter (Signed)
Patients wife called to advise th pre medication sent is not covered by insurance requesting an alternative. ?

## 2021-12-11 NOTE — Patient Instructions (Signed)
You have been scheduled for a colonoscopy. Please follow written instructions given to you at your visit today.  ?Please pick up your prep supplies at the pharmacy within the next 1-3 days. ?If you use inhalers (even only as needed), please bring them with you on the day of your procedure. ?_______________________________________________ ?Your provider has requested that you go to the basement level for lab work before leaving today. Press "B" on the elevator. The lab is located at the first door on the left as you exit the elevator. ?_______________________________________________ ? ?You have been scheduled for an abdominal ultrasound at [facility] (1st floor of hospital) on Thursday, 12/13/21 at 10:30 am. Please arrive 15 minutes prior to your appointment for registration. Make certain not to have anything to eat or drink 6 hours prior to your appointment. Should you need to reschedule your appointment, please contact radiology at [phone]. This test typically takes about 30 minutes to perform. ?________________________________________________ ? ?If you are age 27 or older, your body mass index should be between 23-30. Your Body mass index is 36.36 kg/m?Marland Kitchen If this is out of the aforementioned range listed, please consider follow up with your Primary Care Provider. ? ?If you are age 47 or younger, your body mass index should be between 19-25. Your Body mass index is 36.36 kg/m?Marland Kitchen If this is out of the aformentioned range listed, please consider follow up with your Primary Care Provider.  ? ?________________________________________________________ ? ?The [facility] providers would like to encourage you to use [facility] to communicate with providers for non-urgent requests or questions.  Due to long hold times on the telephone, sending your provider a message by [facility] may be a faster and more efficient way to get a response.  Please allow 48 business hours for a response.  Please remember that this is for  non-urgent requests.  ?_______________________________________________________ ? ?Due to recent changes in healthcare laws, you may see the results of your imaging and laboratory studies on MyChart before your provider has had a chance to review them.  We understand that in some cases there may be results that are confusing or concerning to you. Not all laboratory results come back in the same time frame and the provider may be waiting for multiple results in order to interpret others.  Please give [name] 48 hours in order for your provider to thoroughly review all the results before contacting the office for clarification of your results.  ? ? ?

## 2021-12-11 NOTE — Progress Notes (Signed)
? ?Chief Complaint: Right sided abdominal pain ? ?HPI : 51 year old male with history of seizure disorder, urethral stricture, arthritis, GERD, and obesity presents with abdominal pain. ? ?He has been dealing with right sided abdominal pain that has been there for several months. The pain feels like soreness that travels backwards. It hurts when he urinates as well. He does suffer from back pain issues as well. Sitting down makes the pain better. Moving around makes the pain worse. Ab pain is not affected by having a BM or eating foods. He has had bleeding in the urine in the remote past. He is regular with his BMs. He saw blood in his stools about a year ago. He was having bright red blood at that time. Denies any rectal bleeding currently. Denies prior colonoscopy. He has been deliberately trying to lose weight. Denies N&V or dysphagia. He takes omeprazole for his GERD, which keeps his reflux symptoms under good control. He had a umbilical hernia repair in the past. Denies fam hx of colon cancer. In 09/2021, he did go to the ED for falling out of bed onto the right side of his body when he was diagnosed with contusion of the right side of the abdominal wall and right side of the back. ? ?Wt Readings from Last 3 Encounters:  ?12/11/21 275 lb 9.6 oz (125 kg)  ?07/19/21 276 lb 6.4 oz (125.4 kg)  ?11/02/20 275 lb (124.7 kg)  ? ?Past Medical History:  ?Diagnosis Date  ? Anxiety   ? Arthritis   ? Back pain   ? Depression, major, recurrent, moderate ([place])   ? GERD (gastroesophageal reflux disease)   ? History of seizures   ? Lower extremity weakness   ? Prostate nodule   ? Benign  ? Sleep apnea   ? Suicidal ideation   ? Umbilical hernia   ? Urethral stricture in male   ? ? ? ?Past Surgical History:  ?Procedure Laterality Date  ? FRACTURE SURGERY    ? LT arm, LT leg, Rt leg  ? PROSTATE BIOPSY    ? UMBILICAL HERNIA REPAIR N/A 12/03/2019  ? Procedure: HERNIA REPAIR UMBILICAL ADULT/ WITH MESH;  Surgeon: Aviva Signs, MD;   Location: [facility];  Service: General;  Laterality: N/A;  ? URETHRAL STRICTURE DILATATION    ? ?Family History  ?Problem Relation Age of Onset  ? Hypertension Mother   ? Diabetes Mother   ? Other Father   ?     died at age 38 in accident - log fell on him  ? Sleep apnea Sister   ? Kidney cancer Maternal Aunt   ? Prostate cancer Paternal Uncle   ? Colon cancer Neg Hx   ? Esophageal cancer Neg Hx   ? Liver cancer Neg Hx   ? Pancreatic cancer Neg Hx   ? ?Social History  ? ?Tobacco Use  ? Smoking status: Former  ?  Packs/day: 0.50  ?  Years: 10.00  ?  Pack years: 5.00  ?  Types: Cigarettes  ?  Quit date: 12/01/2002  ?  Years since quitting: 19.0  ? Smokeless tobacco: Never  ?Vaping Use  ? Vaping Use: Never used  ?Substance Use Topics  ? Alcohol use: Not Currently  ? Drug use: Not Currently  ? ?Current Outpatient Medications  ?Medication Sig Dispense Refill  ? albuterol (VENTOLIN HFA) 108 (90 Base) MCG/ACT inhaler Inhale 2 puffs into the lungs every 6 (six) hours as needed for wheezing or shortness of  breath. 8 g 0  ? gabapentin (NEURONTIN) 300 MG capsule Take 300 mg by mouth 2 (two) times daily as needed.    ? LamoTRIgine 200 MG TB24 24 hour tablet TAKE TWO TABLETS BY MOUTH AT BEDTIME 180 tablet 4  ? meloxicam (MOBIC) 7.5 MG tablet Take 7.5 mg by mouth in the morning and at bedtime.    ? omeprazole (PRILOSEC) 40 MG capsule Take 40 mg by mouth daily.    ? PEG-KCl-NaCl-NaSulf-Na Asc-C (PLENVU) 140 g SOLR Take 1 kit by mouth as directed. Use coupon: BIN: [id number] PNC: CNRX Group: [id number] ID: [id number] 1 each 0  ? sertraline (ZOLOFT) 100 MG tablet Take 2 tablets (200 mg total) by mouth daily. 180 tablet 0  ? cariprazine (VRAYLAR) 1.5 MG capsule Take 1 capsule (1.5 mg total) by mouth daily. (Patient not taking: Reported on 12/11/2021) 30 capsule 1  ? ?No current facility-administered medications for this visit.  ? ?No Known Allergies ? ? ?Review of Systems: ?All systems reviewed and negative except where noted in HPI.   ? ?Physical Exam: ?BP 116/80 (BP Location: Left Arm, Patient Position: Sitting, Cuff Size: Normal)   Pulse 72   Resp 16   Ht _0  (1.854 m)   Wt 275 lb 9.6 oz (125 kg)   SpO2 99%   BMI 36.36 kg/m?  ?Constitutional: Pleasant,well-developed, male in no acute distress. ?HEENT: Normocephalic and atraumatic. Conjunctivae are normal. No scleral icterus. ?Cardiovascular: Normal rate, regular rhythm.  ?Pulmonary/chest: Effort normal and breath sounds normal. No wheezing, rales or rhonchi. ?Abdominal: Soft, nondistended, tender in the lower abdomen and in the epigastric area. Bowel sounds active throughout. There are no masses palpable. No hepatomegaly. ?Extremities: No edema ?Neurological: Alert and oriented to person place and time. ?Skin: Skin is warm and dry. No rashes noted. ?Psychiatric: Normal mood and affect. Behavior is normal. ? ?Labs 10/2021: CBC unremarkable. CMP with elevated Cr of 1.42. UDS negative. Tylenol level negative. CK level elevated at 337 (nml 46-171). INR nml. ? ?CT A/P w/contrast 10/04/21: ?1.  No CT evidence of acute abdominal/pelvic process.  ?2.  No evidence of nephrolithiasis or hydronephrosis.  ?3.  No appreciable fracture.  ?4. 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  ?5. Degenerate disc disease of the lumbar spine and mild bilateral hip osteoarthritis.  ? 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 His recent CT scan did not reveal any other obvious etiology of his abdominal pain. His recent labs were only notable for an elevated CK level. At this time I am not certain that his ab pain is due to a GI etiology. His pain is described as position and seems to worsen with urination. This could potentially suggest that his ab pain is related to a MSK etiology  (potentially related to a recent fall or from spinal nerve injury leading to abdominal wall pain) or a urologic etiology (since patient has history of urethral strictures). Will plan for a basic lab work up since patient also has history of fatty liver. Will get a RUQ U/S to rule out gallstones and plan for colonoscopy for colon cancer screening. ?- Check lipase, CRP, CK, ferritin/IBC, ANA, IgG, ASMA, hepatitis A antibody, hepatitis B surface antibody, hepatitis B surface antigen, hepatitis C antibody ?- Check RUQ U/S ?- Colonoscopy [facility] ? ?Lyndee Leo  Lorenso Courier, MD ? ?

## 2021-12-13 ENCOUNTER — Ambulatory Visit (HOSPITAL_COMMUNITY): Payer: Medicare HMO

## 2021-12-16 LAB — HEPATITIS B SURFACE ANTIGEN: Hepatitis B Surface Ag: NONREACTIVE

## 2021-12-16 LAB — HEPATITIS C ANTIBODY
Hepatitis C Ab: NONREACTIVE
SIGNAL TO CUT-OFF: 0.07 (ref <1.00)

## 2021-12-16 LAB — ANA: Anti Nuclear Antibody (ANA): NEGATIVE

## 2021-12-16 LAB — HEPATITIS A ANTIBODY, TOTAL: Hepatitis A AB,Total: NONREACTIVE

## 2021-12-16 LAB — ANTI-SMOOTH MUSCLE ANTIBODY, IGG: Actin (Smooth Muscle) Antibody (IGG): <20 U (ref <20)

## 2021-12-16 LAB — IGG: IgG (Immunoglobin G), Serum: 766 mg/dL (ref 600–1640)

## 2021-12-16 LAB — HEPATITIS B SURFACE ANTIBODY,QUALITATIVE: Hep B S Ab: NONREACTIVE

## 2021-12-18 ENCOUNTER — Ambulatory Visit (HOSPITAL_COMMUNITY)
Admission: RE | Admit: 2021-12-18 | Discharge: 2021-12-18 | Disposition: A | Discharge status: Home / Self Care | Payer: Medicare HMO | Source: Ambulatory Visit | Attending: Internal Medicine | Admitting: Internal Medicine

## 2021-12-18 DIAGNOSIS — K760 Fatty (change of) liver, not elsewhere classified: Secondary | ICD-10-CM | POA: Diagnosis not present

## 2021-12-18 DIAGNOSIS — R109 Unspecified abdominal pain: Secondary | ICD-10-CM | POA: Diagnosis not present

## 2021-12-18 DIAGNOSIS — Z1211 Encounter for screening for malignant neoplasm of colon: Secondary | ICD-10-CM | POA: Insufficient documentation

## 2021-12-18 DIAGNOSIS — R1011 Right upper quadrant pain: Secondary | ICD-10-CM | POA: Diagnosis not present

## 2021-12-26 NOTE — Progress Notes (Signed)
[facility] MD/PA/NP OP Progress Note  01/01/2022 8:36 AM MEAD SLANE  MRN:  [id number]  Chief Complaint:  Chief Complaint  Patient presents with   Follow-up   HPI:  This is a follow-up appointment for depression.  He states that he continues to feel down at times.  He continues to struggle with pain.  Although he wants to go to church, he is unable to do so due to lack of transportation. He is in charge of prayer meeting, and he will have one tonight.  He also reports frustration due to marital conflict.  His wife told him to clean the bathroom, although he is unable to do so as he is unable to bend due to his pain.  He will leave the conversation as he will get frustrated.  Although he knows that he loves her and she loves him, he sometimes does not know about it.  He has depressive symptoms as in PHQ-9.  Although he has occasional passive SI, he denies any intention or plan.  He agrees to contact emergency resources if needed. He has AH of sound, which tells him about death of somebody. He reports that this is spiritual sound, and he does not bothered by this. He denies VH. He denies paranoia.  He has not started [employer].  He also states that his wife has a concern of adding some other medication.  Although he reports concern of weight gain from the medication, he agrees to try for his mood at the end of the visit.   Daily routine: stays in the house most of the time, goes to church on Fridays, Saturdays Employment: on long term disability from his job, (currently waiting for decision from the state). He used to works at [employer] for 13 years Support: Lena: wife, his children Marital status: married  Number of children: 94 (36, 23 year old)  Visit Diagnosis:    ICD-10-CM   1. MDD (major depressive disorder), recurrent episode, moderate ([place])  F33.1       Past Psychiatric History: Please see initial evaluation for full details. I have reviewed the history. No updates at this time.      Past Medical History:  Past Medical History:  Diagnosis Date   Anxiety    Arthritis    Back pain    Depression, major, recurrent, moderate ([facility])    GERD (gastroesophageal reflux disease)    History of seizures    Lower extremity weakness    Prostate nodule    Benign   Sleep apnea    Suicidal ideation    Umbilical hernia    Urethral stricture in male     Past Surgical History:  Procedure Laterality Date   FRACTURE SURGERY     LT arm, LT leg, Rt leg   PROSTATE BIOPSY     UMBILICAL HERNIA REPAIR N/A 12/03/2019   Procedure: HERNIA REPAIR UMBILICAL ADULT/ WITH MESH;  Surgeon: Aviva Signs, MD;  Location: [facility];  Service: General;  Laterality: N/A;   URETHRAL STRICTURE DILATATION      Family Psychiatric History: Please see initial evaluation for full details. I have reviewed the history. No updates at this time.     Family History:  Family History  Problem Relation Age of Onset   Hypertension Mother    Diabetes Mother    Other Father        died at age 5 in accident - log fell on him   Sleep apnea Sister    Kidney  cancer Maternal Aunt    Prostate cancer Paternal Uncle    Colon cancer Neg Hx    Esophageal cancer Neg Hx    Liver cancer Neg Hx    Pancreatic cancer Neg Hx     Social History:  Social History   Socioeconomic History   Marital status: Married    Spouse name: Not on file   Number of children: 2   Years of education: 10th grade   Highest education level: Not on file  Occupational History   Occupation: Disability  Tobacco Use   Smoking status: Former    Packs/day: 0.50    Years: 10.00    Pack years: 5.00    Types: Cigarettes    Quit date: 12/01/2002    Years since quitting: 19.0   Smokeless tobacco: Never  Vaping Use   Vaping Use: Never used  Substance and Sexual Activity   Alcohol use: Not Currently   Drug use: Not Currently   Sexual activity: Not on file  Other Topics Concern   Not on file  Social History Narrative   Lives at home  with his wife and children.   Right-handed.   2 cups caffeine per day.   Social Determinants of Health   Financial Resource Strain: Not on file  Food Insecurity: Not on file  Transportation Needs: Not on file  Physical Activity: Not on file  Stress: Not on file  Social Connections: Not on file    Allergies: No Known Allergies  Metabolic Disorder Labs: No results found for: HGBA1C, MPG No results found for: PROLACTIN Lab Results  Component Value Date   TRIG 187 (H) 08/05/2019   Lab Results  Component Value Date   TSH 1.920 11/02/2020   TSH 2.430 06/10/2019    Therapeutic Level Labs: No results found for: LITHIUM No results found for: VALPROATE No components found for:  CBMZ  Current Medications: Current Outpatient Medications  Medication Sig Dispense Refill   albuterol (VENTOLIN HFA) 108 (90 Base) MCG/ACT inhaler Inhale 2 puffs into the lungs every 6 (six) hours as needed for wheezing or shortness of breath. 8 g 0   cariprazine (VRAYLAR) 1.5 MG capsule Take 1 capsule (1.5 mg total) by mouth daily. 30 capsule 1   gabapentin (NEURONTIN) 300 MG capsule Take 300 mg by mouth 2 (two) times daily as needed.     LamoTRIgine 200 MG TB24 24 hour tablet TAKE TWO TABLETS BY MOUTH AT BEDTIME 180 tablet 4   meloxicam (MOBIC) 7.5 MG tablet Take 7.5 mg by mouth in the morning and at bedtime.     omeprazole (PRILOSEC) 40 MG capsule Take 40 mg by mouth daily.     PEG-KCl-NaCl-NaSulf-Na Asc-C (PLENVU) 140 g SOLR Take 1 kit by mouth as directed. Use coupon: BIN: [id number] PNC: CNRX Group: [id number] ID: [id number] 1 each 0   sertraline (ZOLOFT) 100 MG tablet Take 2 tablets (200 mg total) by mouth daily. 180 tablet 0   No current facility-administered medications for this visit.     Musculoskeletal: Strength & Muscle Tone: within normal limits Gait & Station: normal Patient leans: N/A  Psychiatric Specialty Exam: Review of Systems  Psychiatric/Behavioral:  Positive for dysphoric mood  and suicidal ideas. Negative for agitation, behavioral problems, confusion, decreased concentration, hallucinations, self-injury and sleep disturbance. The patient is nervous/anxious. The patient is not hyperactive.   All other systems reviewed and are negative.  Blood pressure 128/82, pulse 78, temperature 98.7 F (37.1 C), temperature source Temporal, weight 275 lb 9.6  oz (125 kg).Body mass index is 36.36 kg/m.  General Appearance: Fairly Groomed  Eye Contact:  Fair  Speech:  Clear and Coherent  Volume:  Normal  Mood:   good  Affect:  Appropriate and Congruent, down  Thought Process:  Coherent  Orientation:  Full (Time, Place, and Person)  Thought Content: Logical   Suicidal Thoughts:  Yes.  without intent/plan  Homicidal Thoughts:  No  Memory:  Immediate;   Good  Judgement:  Good  Insight:  Fair  Psychomotor Activity:  Normal  Concentration:  Concentration: Good and Attention Span: Good  Recall:  Good  Fund of Knowledge: Good  Language: Good  Akathisia:  No  Handed:  Right  AIMS (if indicated): not done  Assets:  Communication Skills Desire for Improvement  ADL's:  Intact  Cognition: WNL  Sleep:  Fair   Screenings: GAD-7    Health and safety inspector from 12/06/2021 in [place] [facility]  Total GAD-7 Score 18      PHQ2-9    [place] Office Visit from 01/01/2022 in [place] from 12/06/2021 in [place] Video Visit from 08/30/2021 in [place] Video Visit from 06/01/2021 in [place] Video Visit from 04/05/2021 in [place]  PHQ-2 Total Score 0 _0 PHQ-9 Total Score _1 [place] Office Visit from 01/01/2022 in [place] Counselor from 12/06/2021 in [place] [facility]  Video Visit from 06/01/2021 in [place] No Risk Error: Question 1 not populated        Assessment and Plan:  DYSEN EDMONDSON is a 51 y.o. year old male with a history of depression, complex partial seizure, obstructive sleep apnea on CPAP, who presents for follow up appointment for below.   1. MDD (major depressive disorder), recurrent episode, moderate ([place]) Although there has been no more improvement in SI, he continues to struggle with depressive symptoms, and may appear to minimize some of those symptoms.  Psychosocial stressors includes marital conflict, demoralization due to his health condition including seizure, visual change, right leg weakness, and unemployment.t.  Noted that he did have a suicide attempt by jumping from a moving car and overdose of muscle relaxants, which led to admission to [facility].  He has not started Vraylar due to possible side effect.  After discussed in length, he is willing to try this medication for depression.  Discussed potential metabolic side effect and EPS.  Will continue sertraline to target depression.  Will obtain records from [facility] for more details. He was reportedly offered a psychotherapy at [facility]; he was advised to have this appointment.  Although he will greatly benefit from [facility], he declined this.    Plan Continue sertraline 200 mg at night   Start Vraylar 1.5 mg daily Obtain record from [facility] Next appointment- 7/18 at 8:40 for 20 mins, in person Emergency resources which includes 911, ED, suicide crisis line [phone]) are discussed.   - Reviewed TSH ; wnl   Past trials of medication: sertraline, Abilify (irritability, nausea)   This clinician has discussed the side effect associated with medication prescribed during this encounter. Please refer to notes in the previous encounters for more details.   The patient demonstrates the  following risk factors for suicide: Chronic risk factors for suicide  include: psychiatric disorder of depression. Acute risk factors for suicide include: recent suicide attempt, loss (financial, interpersonal, professional). Protective factors for this patient include: positive social support, responsibility to others (children, family), coping skills and hope for the future. Considering these factors, the overall suicide risk at this point appears to be moderate, but not at imminent risk. Patient is appropriate for outpatient follow up.          Collaboration of Care: Collaboration of Care: Other N/A  Patient/Guardian was advised Release of Information must be obtained prior to any record release in order to collaborate their care with an outside provider. Patient/Guardian was advised if they have not already done so to contact the registration department to sign all necessary forms in order for [name] to release information regarding their care.   Consent: Patient/Guardian gives verbal consent for treatment and assignment of benefits for services provided during this visit. Patient/Guardian expressed understanding and agreed to proceed.    [name], MD 01/01/2022, 8:36 AM

## 2021-12-27 ENCOUNTER — Ambulatory Visit (INDEPENDENT_AMBULATORY_CARE_PROVIDER_SITE_OTHER): Payer: Medicare HMO | Admitting: Clinical

## 2021-12-27 DIAGNOSIS — F331 Major depressive disorder, recurrent, moderate: Secondary | ICD-10-CM

## 2021-12-27 DIAGNOSIS — F419 Anxiety disorder, unspecified: Secondary | ICD-10-CM

## 2021-12-27 NOTE — Progress Notes (Signed)
IN PERSON  I connected with Dale Bender on 12/27/21 at 11:00 AM EDT bin person and verified that I am speaking with the correct person using two identifiers.  Location: Patient: Office Provider: Office   THERAPIST PROGRESS NOTE   Session Time: 11:00AM-11:25AM   Participation Level: Active   Behavioral Response: CasualAlertImprovedMood   Type of Therapy: Individual Therapy   Treatment Goals addressed: Coping   Interventions: CBT, Solution Focused, Strength-based, Supportive and Social Skills Training   Summary: Dale Bender is a 51 y.o. male who presents with Depression and Anxiety . The OPT therapist worked with the patient for his treatment. The OPT therapist utilized Motivational Interviewing to assist in creating therapeutic repore. The patient in the session was engaged and work in collaboration giving feedback about his triggers and symptoms over the past few weeks. The patient spoke about his difficulty adjusting to life changes and not feeling like he is able to do what he use to, having a sense of loss of purpose, and reviewed his prior crisis resulting from a conflict with his daughter.. The OPT therapist utilized Cognitive Behavioral Therapy through cognitive restructuring as well as worked with the patient on coping strategies to assist in management of mood, improve coping, and improve positive thinking while empowering the patient to be active within his health limits.The OPT therapist examined upcoming health appointments and continued to encourage the patient to stay consistent and compliant with all medical appointments and recommendations.The patient in this session noted he has been  active and has added walking recently to his daily routine.   Suicidal/Homicidal: Nowithout intent/plan   Therapist Response: The OPT therapist worked with the patient for the patients  scheduled session. The patient was engaged in his session and gave feedback in relation to  triggers, symptoms, and behavior responses over the past few weeks. The OPT therapist worked with the patient utilizing an in session Cognitive Behavioral Therapy exercise. The patient was responsive in the session and verbalized, " I have difficulty when I have these things I use to do and now I can not do them and I feel like I am letting other people down and that I am a burden ". The OPT therapist worked with the patient on setting realistic goals/ low hanging fruit to allow the patient some feeling of success in completing tasks and to help improve his mood. The OPT therapist overviewed communication with the patients support system and working to have his support system onboard with his therapy homework over the next few weeks (setting achievable in home tasks).     Plan:  Patient will return in 2/3 weeks   Diagnosis:      Axis I: Recurrent Depression with Anxiety                         Axis II: No diagnosis Collaboration of Care: No additional collaboration for this session.   Patient/Guardian was advised Release of Information must be obtained prior to any record release in order to collaborate their care with an outside provider. Patient/Guardian was advised if they have not already done so to contact the registration department to sign all necessary forms in order for [name] to release information regarding their care.    Consent: Patient/Guardian gives verbal consent for treatment and assignment of benefits for services provided during this visit. Patient/Guardian expressed understanding and agreed to proceed.     I discussed the assessment and treatment plan with the  patient. The patient was provided an opportunity to ask questions and all were answered. The patient agreed with the plan and demonstrated an understanding of the instructions.   The patient was advised to call back or seek an in-person evaluation if the symptoms worsen or if the condition fails to improve as anticipated.   I  provided 45 minutes of non-face-to-face time during this encounter.   [name], LCSW   12/27/2021

## 2022-01-01 ENCOUNTER — Ambulatory Visit (INDEPENDENT_AMBULATORY_CARE_PROVIDER_SITE_OTHER): Payer: Medicare HMO | Admitting: Psychiatry

## 2022-01-01 ENCOUNTER — Encounter: Payer: Self-pay | Admitting: Psychiatry

## 2022-01-01 VITALS — BP 128/82 | HR 78 | Temp 98.7°F | Wt 275.6 lb

## 2022-01-01 DIAGNOSIS — F331 Major depressive disorder, recurrent, moderate: Secondary | ICD-10-CM

## 2022-01-22 DIAGNOSIS — R1084 Generalized abdominal pain: Secondary | ICD-10-CM | POA: Diagnosis not present

## 2022-01-22 DIAGNOSIS — R030 Elevated blood-pressure reading, without diagnosis of hypertension: Secondary | ICD-10-CM | POA: Diagnosis not present

## 2022-01-22 DIAGNOSIS — Z0001 Encounter for general adult medical examination with abnormal findings: Secondary | ICD-10-CM | POA: Diagnosis not present

## 2022-01-22 DIAGNOSIS — Z6835 Body mass index (BMI) 35.0-35.9, adult: Secondary | ICD-10-CM | POA: Diagnosis not present

## 2022-01-22 DIAGNOSIS — M545 Low back pain, unspecified: Secondary | ICD-10-CM | POA: Diagnosis not present

## 2022-01-22 DIAGNOSIS — Z23 Encounter for immunization: Secondary | ICD-10-CM | POA: Diagnosis not present

## 2022-01-22 DIAGNOSIS — F331 Major depressive disorder, recurrent, moderate: Secondary | ICD-10-CM | POA: Diagnosis not present

## 2022-01-22 DIAGNOSIS — R5383 Other fatigue: Secondary | ICD-10-CM | POA: Diagnosis not present

## 2022-01-25 ENCOUNTER — Encounter: Payer: Self-pay | Admitting: Internal Medicine

## 2022-01-25 ENCOUNTER — Ambulatory Visit (AMBULATORY_SURGERY_CENTER): Payer: Medicare HMO | Admitting: Internal Medicine

## 2022-01-25 VITALS — BP 119/75 | HR 64 | Temp 97.5°F | Resp 13

## 2022-01-25 DIAGNOSIS — R933 Abnormal findings on diagnostic imaging of other parts of digestive tract: Secondary | ICD-10-CM

## 2022-01-25 DIAGNOSIS — R109 Unspecified abdominal pain: Secondary | ICD-10-CM | POA: Diagnosis not present

## 2022-01-25 DIAGNOSIS — K635 Polyp of colon: Secondary | ICD-10-CM | POA: Diagnosis not present

## 2022-01-25 DIAGNOSIS — G4730 Sleep apnea, unspecified: Secondary | ICD-10-CM | POA: Diagnosis not present

## 2022-01-25 DIAGNOSIS — D123 Benign neoplasm of transverse colon: Secondary | ICD-10-CM

## 2022-01-25 DIAGNOSIS — D125 Benign neoplasm of sigmoid colon: Secondary | ICD-10-CM

## 2022-01-25 DIAGNOSIS — F329 Major depressive disorder, single episode, unspecified: Secondary | ICD-10-CM | POA: Diagnosis not present

## 2022-01-25 DIAGNOSIS — K760 Fatty (change of) liver, not elsewhere classified: Secondary | ICD-10-CM

## 2022-01-25 DIAGNOSIS — F419 Anxiety disorder, unspecified: Secondary | ICD-10-CM | POA: Diagnosis not present

## 2022-01-25 DIAGNOSIS — K5730 Diverticulosis of large intestine without perforation or abscess without bleeding: Secondary | ICD-10-CM

## 2022-01-25 DIAGNOSIS — Z1211 Encounter for screening for malignant neoplasm of colon: Secondary | ICD-10-CM | POA: Diagnosis not present

## 2022-01-25 DIAGNOSIS — D120 Benign neoplasm of cecum: Secondary | ICD-10-CM

## 2022-01-25 MED ORDER — SODIUM CHLORIDE 0.9 % IV SOLN: 500.0000 mL | Freq: Once | INTRAVENOUS | Status: DC

## 2022-01-25 NOTE — Progress Notes (Signed)
A and O x3. Report to RN. Tolerated MAC anesthesia well. 

## 2022-01-25 NOTE — Progress Notes (Signed)
GASTROENTEROLOGY PROCEDURE H&P NOTE   Primary Care Physician: Denny Levy, PA    Reason for Procedure:   Colon cancer screening, right sided abdominal pain, misty mesentery on CT scan  Plan:    Colonoscopy  Patient is appropriate for endoscopic procedure(s) in the ambulatory ([place]) setting.  The nature of the procedure, as well as the risks, benefits, and alternatives were carefully and thoroughly reviewed with the patient. Ample time for discussion and questions allowed. The patient understood, was satisfied, and agreed to proceed.     HPI: Dale Bender is a 51 y.o. male who presents for colonoscopy for evaluation of right sided abdominal pain, misty mesentery on CT scan, colon cancer screening .  Patient was most recently seen in the Gastroenterology Clinic on 12/11/21.  No interval change in medical history since that appointment. Please refer to that note for full details regarding GI history and clinical presentation.   Past Medical History:  Diagnosis Date   Anxiety    Arthritis    Back pain    Depression, major, recurrent, moderate (HCC)    GERD (gastroesophageal reflux disease)    History of seizures    Lower extremity weakness    Prostate nodule    Benign   Sleep apnea    Suicidal ideation    Umbilical hernia    Urethral stricture in male     Past Surgical History:  Procedure Laterality Date   FRACTURE SURGERY     LT arm, LT leg, Rt leg   PROSTATE BIOPSY     UMBILICAL HERNIA REPAIR N/A 12/03/2019   Procedure: HERNIA REPAIR UMBILICAL ADULT/ WITH MESH;  Surgeon: Aviva Signs, MD;  Location: [facility];  Service: General;  Laterality: N/A;   URETHRAL STRICTURE DILATATION      Prior to Admission medications   Medication Sig Start Date End Date Taking? Authorizing Provider  albuterol (VENTOLIN HFA) 108 (90 Base) MCG/ACT inhaler Inhale 2 puffs into the lungs every 6 (six) hours as needed for wheezing or shortness of breath. 08/06/19   Manuella Ghazi, Pratik D, DO   gabapentin (NEURONTIN) 300 MG capsule Take 300 mg by mouth 2 (two) times daily as needed. 11/02/21   [provider]  LamoTRIgine 200 MG TB24 24 hour tablet TAKE TWO TABLETS BY MOUTH AT BEDTIME 03/27/21   Marcial Pacas, MD  meloxicam (MOBIC) 7.5 MG tablet Take 7.5 mg by mouth in the morning and at bedtime.    [provider]  omeprazole (PRILOSEC) 40 MG capsule Take 40 mg by mouth daily. 07/14/19   [provider]  PEG-KCl-NaCl-NaSulf-Na Asc-C (PLENVU) 140 g SOLR Take 1 kit by mouth as directed. Use coupon: BIN: [id number] [facility]: [facility] Group: [id number] ID: [id number] 12/11/21   Sharyn Creamer, MD  sertraline (ZOLOFT) 100 MG tablet Take 2 tablets (200 mg total) by mouth daily. 12/10/21 03/10/22  [name], MD    Current Outpatient Medications  Medication Sig Dispense Refill   gabapentin (NEURONTIN) 300 MG capsule Take 300 mg by mouth 2 (two) times daily as needed.     LamoTRIgine 200 MG TB24 24 hour tablet TAKE TWO TABLETS BY MOUTH AT BEDTIME 180 tablet 4   meloxicam (MOBIC) 7.5 MG tablet Take 7.5 mg by mouth in the morning and at bedtime.     omeprazole (PRILOSEC) 40 MG capsule Take 40 mg by mouth daily.     sertraline (ZOLOFT) 100 MG tablet Take 2 tablets (200 mg total) by mouth daily. 180 tablet 0  albuterol (VENTOLIN HFA) 108 (90 Base) MCG/ACT inhaler Inhale 2 puffs into the lungs every 6 (six) hours as needed for wheezing or shortness of breath. 8 g 0   Current Facility-Administered Medications  Medication Dose Route Frequency Provider Last Rate Last Admin   0.9 %  sodium chloride infusion  500 mL Intravenous Once Sharyn Creamer, MD        Allergies as of 01/25/2022   (No Known Allergies)    Family History  Problem Relation Age of Onset   Hypertension Mother    Diabetes Mother    Other Father        died at age 96 in accident - log fell on him   Sleep apnea Sister    Kidney cancer Maternal Aunt    Prostate cancer Paternal Uncle    Colon cancer Neg Hx     Esophageal cancer Neg Hx    Liver cancer Neg Hx    Pancreatic cancer Neg Hx     Social History   Socioeconomic History   Marital status: Married    Spouse name: Not on file   Number of children: 2   Years of education: 10th grade   Highest education level: Not on file  Occupational History   Occupation: Disability  Tobacco Use   Smoking status: Former    Packs/day: 0.50    Years: 10.00    Total pack years: 5.00    Types: Cigarettes    Quit date: 12/01/2002    Years since quitting: 19.1   Smokeless tobacco: Never  Vaping Use   Vaping Use: Never used  Substance and Sexual Activity   Alcohol use: Not Currently   Drug use: Not Currently   Sexual activity: Not on file  Other Topics Concern   Not on file  Social History Narrative   Lives at home with his wife and children.   Right-handed.   2 cups caffeine per day.   Social Determinants of Health   Financial Resource Strain: Not on file  Food Insecurity: Not on file  Transportation Needs: Not on file  Physical Activity: Not on file  Stress: Not on file  Social Connections: Not on file  Intimate Partner Violence: Not on file    Physical Exam: Vital signs in last 24 hours: BP 132/75   Pulse 75   Temp (!) 97.5 F (36.4 C)   Resp 13   SpO2 97%  GEN: NAD EYE: Sclerae anicteric ENT: MMM CV: Non-tachycardic Pulm: No increased WOB GI: Soft NEURO:  Alert & Oriented   Christia Reading, MD [facility]   01/25/2022 10:18 AM

## 2022-01-25 NOTE — Progress Notes (Signed)
Pt's states no medical or surgical changes since previsit or office visit.   Dr Lorenso Courier and C.Powers, CRNA made aware of Seizure in March 2023

## 2022-01-25 NOTE — Op Note (Signed)
[place] Patient Name: Dale Bender Procedure Date: 01/25/2022 10:15 AM MRN: [id number] Endoscopist: Sonny Masters "Dale Bender ,  Age: 51 Referring MD:  Date of Birth: [birth year] Gender: Male Account #: [id number] Procedure:                Colonoscopy Indications:              Screening for colorectal malignant neoplasm, ,                            Incidental - Abnormal CT of the GI tract Medicines:                Monitored Anesthesia Care Procedure:                Pre-Anesthesia Assessment:                           - Prior to the procedure, a History and Physical                            was performed, and patient medications and                            allergies were reviewed. The patient's tolerance of                            previous anesthesia was also reviewed. The risks                            and benefits of the procedure and the sedation                            options and risks were discussed with the patient.                            All questions were answered, and informed consent                            was obtained. Prior Anticoagulants: The patient has                            taken no previous anticoagulant or antiplatelet                            agents. ASA Grade Assessment: II - A patient with                            mild systemic disease. After reviewing the risks                            and benefits, the patient was deemed in                            satisfactory condition to undergo the procedure.  After obtaining informed consent, the colonoscope                            was passed under direct vision. Throughout the                            procedure, the patient's blood pressure, pulse, and                            oxygen saturations were monitored continuously. The                            Olympus CF-HQ190L [id number]) Colonoscope was                            introduced through the  anus and advanced to the the                            terminal ileum. The colonoscopy was performed                            without difficulty. The patient tolerated the                            procedure well. The quality of the bowel                            preparation was good. The terminal ileum, ileocecal                            valve, appendiceal orifice, and rectum were                            photographed. Scope In: 10:21:20 AM Scope Out: 10:48:22 AM Scope Withdrawal Time: 0 hours 21 minutes 23 seconds  Total Procedure Duration: 0 hours 27 minutes 2 seconds  Findings:                 The terminal ileum appeared normal.                           A scattered area of mildly erythematous mucosa was                            found in the sigmoid colon, in the descending                            colon, in the transverse colon, in the ascending                            colon and in the cecum. This was biopsied with a                            cold forceps for histology.  Three sessile polyps were found in the transverse                            colon and cecum. The polyps were 3 to 7 mm in size.                            These polyps were removed with a cold snare.                            Resection and retrieval were complete.                           A 5 mm polyp was found in the sigmoid colon. The                            polyp was sessile. The polyp was removed with a                            cold snare. Resection and retrieval were complete.                           A few small-mouthed diverticula were found in the                            sigmoid colon and cecum. Biopsies were taken with a                            cold forceps for histology.                           Non-bleeding internal hemorrhoids were found during                            retroflexion. Complications:            No immediate  complications. Estimated Blood Loss:     Estimated blood loss was minimal. Impression:               - The examined portion of the ileum was normal.                           - Erythematous mucosa in the sigmoid colon, in the                            descending colon, in the transverse colon, in the                            ascending colon and in the cecum. Biopsied.                           - Three 3 to 7 mm polyps in the transverse colon  and in the cecum, removed with a cold snare.                            Resected and retrieved.                           - One 5 mm polyp in the sigmoid colon, removed with                            a cold snare. Resected and retrieved.                           - Diverticulosis in the sigmoid colon and in the                            cecum. Biopsied.                           - Non-bleeding internal hemorrhoids. Recommendation:           - Discharge patient to home (with escort).                           - Await pathology results.                           - Return to GI clinic in 4 weeks.                           - The findings and recommendations were discussed                            with the patient. Sonny Masters "Dale Bender,  01/25/2022 10:53:50 AM

## 2022-01-25 NOTE — Progress Notes (Signed)
Called to room to assist during endoscopic procedure.  Patient ID and intended procedure confirmed with present staff. Received instructions for my participation in the procedure from the performing physician.  

## 2022-01-25 NOTE — Patient Instructions (Signed)
Handout on polyps and diverticulosis given.    YOU HAD AN ENDOSCOPIC PROCEDURE TODAY AT [facility]:   Refer to the procedure report that was given to you for any specific questions about what was found during the examination.  If the procedure report does not answer your questions, please call your gastroenterologist to clarify.  If you requested that your care partner not be given the details of your procedure findings, then the procedure report has been included in a sealed envelope for you to review at your convenience later.  YOU SHOULD EXPECT: Some feelings of bloating in the abdomen. Passage of more gas than usual.  Walking can help get rid of the air that was put into your GI tract during the procedure and reduce the bloating. If you had a lower endoscopy (such as a colonoscopy or flexible sigmoidoscopy) you may notice spotting of blood in your stool or on the toilet paper. If you underwent a bowel prep for your procedure, you may not have a normal bowel movement for a few days.  Please Note:  You might notice some irritation and congestion in your nose or some drainage.  This is from the oxygen used during your procedure.  There is no need for concern and it should clear up in a day or so.  SYMPTOMS TO REPORT IMMEDIATELY:  Following lower endoscopy (colonoscopy or flexible sigmoidoscopy):  Excessive amounts of blood in the stool  Significant tenderness or worsening of abdominal pains  Swelling of the abdomen that is new, acute  Fever of 100F or higher   For urgent or emergent issues, a gastroenterologist can be reached at any hour by calling [phone]. Do not use MyChart messaging for urgent concerns.    DIET:  We do recommend a small meal at first, but then you may proceed to your regular diet.  Drink plenty of fluids but you should avoid alcoholic beverages for 24 hours.  ACTIVITY:  You should plan to take it easy for the rest of today and you should NOT  DRIVE or use heavy machinery until tomorrow (because of the sedation medicines used during the test).    FOLLOW UP: Our staff will call the number listed on your records 24-72 hours following your procedure to check on you and address any questions or concerns that you may have regarding the information given to you following your procedure. If we do not reach you, we will leave a message.  We will attempt to reach you two times.  During this call, we will ask if you have developed any symptoms of COVID 19. If you develop any symptoms (ie: fever, flu-like symptoms, shortness of breath, cough etc.) before then, please call [phone].  If you test positive for Covid 19 in the 2 weeks post procedure, please call and report this information to [name].    If any biopsies were taken you will be contacted by phone or by letter within the next 1-3 weeks.  Please call us at [phone] if you have not heard about the biopsies in 3 weeks.    SIGNATURES/CONFIDENTIALITY: You and/or your care partner have signed paperwork which will be entered into your electronic medical record.  These signatures attest to the fact that that the information above on your After Visit Summary has been reviewed and is understood.  Full responsibility of the confidentiality of this discharge information lies with you and/or your care-partner.

## 2022-01-28 ENCOUNTER — Telehealth: Payer: Self-pay

## 2022-01-28 NOTE — Telephone Encounter (Signed)
  Follow up Call-     01/25/2022   10:08 AM  Call back number  Post procedure Call Back phone  # [phone]  Permission to leave phone message Yes     Patient questions:  Do you have a fever, pain , or abdominal swelling? No. Pain Score  0 *  Have you tolerated food without any problems? Yes.    Have you been able to return to your normal activities? Yes.    Do you have any questions about your discharge instructions: Diet   No. Medications  No. Follow up visit  No.  Do you have questions or concerns about your Care? No.  Actions: * If pain score is 4 or above: No action needed, pain <4.

## 2022-01-29 ENCOUNTER — Other Ambulatory Visit: Payer: Self-pay

## 2022-01-29 DIAGNOSIS — M5116 Intervertebral disc disorders with radiculopathy, lumbar region: Secondary | ICD-10-CM | POA: Diagnosis not present

## 2022-01-29 DIAGNOSIS — G894 Chronic pain syndrome: Secondary | ICD-10-CM | POA: Diagnosis not present

## 2022-01-30 ENCOUNTER — Encounter: Payer: Self-pay | Admitting: Internal Medicine

## 2022-02-19 DIAGNOSIS — M545 Low back pain, unspecified: Secondary | ICD-10-CM | POA: Diagnosis not present

## 2022-02-19 DIAGNOSIS — G894 Chronic pain syndrome: Secondary | ICD-10-CM | POA: Diagnosis not present

## 2022-02-19 DIAGNOSIS — M5116 Intervertebral disc disorders with radiculopathy, lumbar region: Secondary | ICD-10-CM | POA: Diagnosis not present

## 2022-02-19 DIAGNOSIS — Z79891 Long term (current) use of opiate analgesic: Secondary | ICD-10-CM | POA: Diagnosis not present

## 2022-02-24 NOTE — Progress Notes (Unsigned)
Virtual Visit via Video Note  I connected with Dale Bender on 02/26/22 at  8:40 AM EDT by a video enabled telemedicine application and verified that I am speaking with the correct person using two identifiers.  Location: Patient: home Provider: office Persons participated in the visit- patient, provider    I discussed the limitations of evaluation and management by telemedicine and the availability of in person appointments. The patient expressed understanding and agreed to proceed.    I discussed the assessment and treatment plan with the patient. The patient was provided an opportunity to ask questions and all were answered. The patient agreed with the plan and demonstrated an understanding of the instructions.   The patient was advised to call back or seek an in-person evaluation if the symptoms worsen or if the condition fails to improve as anticipated.  I provided 10 minutes of non-face-to-face time during this encounter.   Dale Clay, MD    [facility] MD/PA/NP OP Progress Note  02/26/2022 9:07 AM Dale Bender  MRN:  [id number]  Chief Complaint:  Chief Complaint  Patient presents with   Follow-up   Depression   HPI:  This is a follow-up appointment for depression.  He states that he feel "sometimes good, sometimes don't."  He is trying to make day to day.  He has not started Vraylar as he was concerned about side effect.  His wife is at work.  They have occasional conflict.  He reports good relationship with his children.  He goes to church as an illness there is transportation.  He wishes to get his mind back, and focus better.  He is hoping to take children to places, and going to church.  He sleeps better using CPAP once he gets to sleep.  He denies change in appetite.  Although he has fleeting passive SI, it has been less frequent.  He has AH of some whistle.  He denies VH.  He agrees to try Vraylar at this time.  He states that he does not have an appointment with Mr.  Dale Bender for therapy as he has not heard from the office.  He is willing to have a follow-up appointment.    Wt Readings from Last 3 Encounters:  01/01/22 275 lb 9.6 oz (125 kg)  12/11/21 275 lb 9.6 oz (125 kg)  07/19/21 276 lb 6.4 oz (125.4 kg)     Daily routine: stays in the house most of the time, goes to church on Wed, Sundays Employment: on long term disability from his job, (currently waiting for decision from the state). He used to works at [employer] for 13 years Support: [place]: wife, his children Marital status: married  Number of children: 35 (31, 28 year old son/daughter)    Visit Diagnosis:    ICD-10-CM   1. MDD (major depressive disorder), recurrent episode, moderate ([place])  F33.1       Past Psychiatric History: Please see initial evaluation for full details. I have reviewed the history. No updates at this time.     Past Medical History:  Past Medical History:  Diagnosis Date   Anxiety    Arthritis    Back pain    Depression, major, recurrent, moderate (HCC)    GERD (gastroesophageal reflux disease)    History of seizures    Lower extremity weakness    Prostate nodule    Benign   Sleep apnea    Suicidal ideation    Umbilical hernia    Urethral stricture  in male     Past Surgical History:  Procedure Laterality Date   FRACTURE SURGERY     LT arm, LT leg, Rt leg   PROSTATE BIOPSY     UMBILICAL HERNIA REPAIR N/A 12/03/2019   Procedure: HERNIA REPAIR UMBILICAL ADULT/ WITH MESH;  Surgeon: Aviva Signs, MD;  Location: [facility];  Service: General;  Laterality: N/A;   URETHRAL STRICTURE DILATATION      Family Psychiatric History: Please see initial evaluation for full details. I have reviewed the history. No updates at this time.     Family History:  Family History  Problem Relation Age of Onset   Hypertension Mother    Diabetes Mother    Other Father        died at age 46 in accident - log fell on him   Sleep apnea Sister    Kidney cancer  Maternal Aunt    Prostate cancer Paternal Uncle    Colon cancer Neg Hx    Esophageal cancer Neg Hx    Liver cancer Neg Hx    Pancreatic cancer Neg Hx     Social History:  Social History   Socioeconomic History   Marital status: Married    Spouse name: Not on file   Number of children: 2   Years of education: 10th grade   Highest education level: Not on file  Occupational History   Occupation: Disability  Tobacco Use   Smoking status: Former    Packs/day: 0.50    Years: 10.00    Total pack years: 5.00    Types: Cigarettes    Quit date: 12/01/2002    Years since quitting: 19.2   Smokeless tobacco: Never  Vaping Use   Vaping Use: Never used  Substance and Sexual Activity   Alcohol use: Not Currently   Drug use: Not Currently   Sexual activity: Not on file  Other Topics Concern   Not on file  Social History Narrative   Lives at home with his wife and children.   Right-handed.   2 cups caffeine per day.   Social Determinants of Health   Financial Resource Strain: Not on file  Food Insecurity: Not on file  Transportation Needs: Not on file  Physical Activity: Not on file  Stress: Not on file  Social Connections: Not on file    Allergies: No Known Allergies  Metabolic Disorder Labs: No results found for: "HGBA1C", "MPG" No results found for: "PROLACTIN" Lab Results  Component Value Date   TRIG 187 (H) 08/05/2019   Lab Results  Component Value Date   TSH 1.920 11/02/2020   TSH 2.430 06/10/2019    Therapeutic Level Labs: No results found for: "LITHIUM" No results found for: "VALPROATE" No results found for: "CBMZ"  Current Medications: Current Outpatient Medications  Medication Sig Dispense Refill   albuterol (VENTOLIN HFA) 108 (90 Base) MCG/ACT inhaler Inhale 2 puffs into the lungs every 6 (six) hours as needed for wheezing or shortness of breath. 8 g 0   gabapentin (NEURONTIN) 300 MG capsule Take 300 mg by mouth 2 (two) times daily as needed.      LamoTRIgine 200 MG TB24 24 hour tablet TAKE TWO TABLETS BY MOUTH AT BEDTIME 180 tablet 4   meloxicam (MOBIC) 7.5 MG tablet Take 7.5 mg by mouth in the morning and at bedtime.     omeprazole (PRILOSEC) 40 MG capsule Take 40 mg by mouth daily.     sertraline (ZOLOFT) 100 MG tablet Take 2 tablets (  200 mg total) by mouth daily. 180 tablet 0   No current facility-administered medications for this visit.     Musculoskeletal: Strength & Muscle Tone:  N/A Gait & Station:  N/A Patient leans: N/A  Psychiatric Specialty Exam: Review of Systems  Psychiatric/Behavioral:  Positive for decreased concentration, dysphoric mood, sleep disturbance and suicidal ideas. Negative for agitation, behavioral problems, confusion, hallucinations and self-injury. The patient is not nervous/anxious and is not hyperactive.   All other systems reviewed and are negative.   There were no vitals taken for this visit.There is no height or weight on file to calculate BMI.  General Appearance: Fairly Groomed  Eye Contact:  Good  Speech:  Clear and Coherent  Volume:  Normal  Mood:  Depressed  Affect:  Appropriate, Congruent, and calm, slightly down  Thought Process:  Coherent  Orientation:  Full (Time, Place, and Person)  Thought Content: Logical   Suicidal Thoughts:  No  Homicidal Thoughts:  No  Memory:  Immediate;   Good  Judgement:  Good  Insight:  Good  Psychomotor Activity:  Normal  Concentration:  Concentration: Good and Attention Span: Good  Recall:  Good  Fund of Knowledge: Good  Language: Good  Akathisia:  No  Handed:  Right  AIMS (if indicated): not done  Assets:  Communication Skills Desire for Improvement  ADL's:  Intact  Cognition: WNL  Sleep:  Fair   Screenings: GAD-7    Health and safety inspector from 12/06/2021 in [place] [facility]  Total GAD-7 Score 18      PHQ2-9    [place] Office Visit from 01/01/2022 in [place] from 12/06/2021 in [place] Video Visit from 08/30/2021 in [place] Video Visit from 06/01/2021 in [place] Video Visit from 04/05/2021 in [place]  PHQ-2 Total Score 0 '4 4 2 5  '$ PHQ-9 Total Score '2 20 10 5 13      '$ [place] Office Visit from 01/01/2022 in [place] Counselor from 12/06/2021 in [place] [facility] Video Visit from 06/01/2021 in [place] No Risk Error: Question 1 not populated        Assessment and Plan:  KITO CUFFE is a 51 y.o. year old male with a history of depression, complex partial seizure, obstructive sleep apnea on CPAP, who presents for follow up appointment for below.   1. MDD (major depressive disorder), recurrent episode, moderate ([place]) He continues to report depressive symptoms since the last visit. Psychosocial stressors includes marital conflict, demoralization due to his health condition including seizure, visual change, right leg weakness, and unemployment. He had a recent suicide attempt of jumping from a moving car and overdose of muscle relaxants, which led to admission to [facility].  He has not started Vraylar due to concern of potential side effect.  After discussion at length, he agrees to try this medication for depression.  Discussed potential metabolic side effect and EPS.  Will continue sertraline to target depression.  He has not seen Mr. Dale Bender, although he is willing to see him again for therapy.  The front desk is notified to coordinate this.   Plan Continue sertraline 200 mg at night   Start Vraylar 1.5 mg daily (order was sent a few months ago, and he has not picked this up) Front desk to contact to schedule follow up appointment with  Mr. Dale Bender  for therapy Next appointment- 8/16 at 8:30 for 30 mins, video- declined in person visit due to lack of transportation  - Reviewed TSH ; wnl   Past trials of medication: sertraline, Abilify (irritability, nausea)   I have reviewed suicide assessment in detail. No change in the following assessment.    The patient demonstrates the following risk factors for suicide: Chronic risk factors for suicide include: psychiatric disorder of depression. Acute risk factors for suicide include: recent suicide attempt, loss (financial, interpersonal, professional). Protective factors for this patient include: positive social support, responsibility to others (children, family), coping skills and hope for the future. Considering these factors, the overall suicide risk at this point appears to be moderate, but not at imminent risk. Patient is appropriate for outpatient follow up. Emergency resources which includes 911, ED, suicide crisis line [phone]) are discussed.       This clinician has discussed the side effect associated with medication prescribed during this encounter. Please refer to notes in the previous encounters for more details.       Collaboration of Care: Collaboration of Care: Other N/A  Patient/Guardian was advised Release of Information must be obtained prior to any record release in order to collaborate their care with an outside provider. Patient/Guardian was advised if they have not already done so to contact the registration department to sign all necessary forms in order for [name] to release information regarding their care.   Consent: Patient/Guardian gives verbal consent for treatment and assignment of benefits for services provided during this visit. Patient/Guardian expressed understanding and agreed to proceed.    Dale Clay, MD 02/26/2022, 9:07 AM

## 2022-02-26 ENCOUNTER — Telehealth (INDEPENDENT_AMBULATORY_CARE_PROVIDER_SITE_OTHER): Payer: Medicare HMO | Admitting: Psychiatry

## 2022-02-26 ENCOUNTER — Encounter: Payer: Self-pay | Admitting: Psychiatry

## 2022-02-26 DIAGNOSIS — F331 Major depressive disorder, recurrent, moderate: Secondary | ICD-10-CM

## 2022-03-01 ENCOUNTER — Ambulatory Visit (INDEPENDENT_AMBULATORY_CARE_PROVIDER_SITE_OTHER): Payer: Medicare HMO | Admitting: Internal Medicine

## 2022-03-01 ENCOUNTER — Encounter: Payer: Self-pay | Admitting: Internal Medicine

## 2022-03-01 VITALS — BP 118/70 | HR 77 | Ht 73.0 in | Wt 262.0 lb

## 2022-03-01 DIAGNOSIS — R109 Unspecified abdominal pain: Secondary | ICD-10-CM | POA: Diagnosis not present

## 2022-03-01 DIAGNOSIS — K760 Fatty (change of) liver, not elsewhere classified: Secondary | ICD-10-CM | POA: Diagnosis not present

## 2022-03-01 DIAGNOSIS — K5900 Constipation, unspecified: Secondary | ICD-10-CM

## 2022-03-01 DIAGNOSIS — Z23 Encounter for immunization: Secondary | ICD-10-CM | POA: Diagnosis not present

## 2022-03-01 DIAGNOSIS — K649 Unspecified hemorrhoids: Secondary | ICD-10-CM

## 2022-03-01 MED ORDER — HYDROCORTISONE (PERIANAL) 2.5 % EX CREA: 1.0000 | TOPICAL_CREAM | Freq: Two times a day (BID) | CUTANEOUS | 1 refills | Status: AC

## 2022-03-01 NOTE — Progress Notes (Signed)
Chief Complaint: Right sided abdominal pain  HPI : 51 year old male with history of seizure disorder, urethral stricture, arthritis, GERD, and obesity presents for follow up of abdominal pain.  He has been dealing with right sided abdominal pain that has been there for several months. The pain feels like soreness that travels backwards. It hurts when he urinates as well. He does suffer from back pain issues as well. Sitting down makes the pain better. Moving around makes the pain worse. Ab pain is not affected by having a BM or eating foods. He has had bleeding in the urine in the remote past. He is regular with his BMs. He saw blood in his stools about a year ago. He was having bright red blood at that time. Denies any rectal bleeding currently. Denies prior colonoscopy. He has been deliberately trying to lose weight. Denies N&V or dysphagia. He takes omeprazole for his GERD, which keeps his reflux symptoms under good control. He had a umbilical hernia repair in the past. Denies fam hx of colon cancer. In 09/2021, he did go to the ED for falling out of bed onto the right side of his body when he was diagnosed with contusion of the right side of the abdominal wall and right side of the back.  Interval History: Patient states that he has been having more issues with constipation ever since his colonoscopy.  He is on average having 1 bowel movement every 2 days.  Denies rectal bleeding.  He is experiencing some rectal pain.  He still has his right-sided lower abdominal discomfort is stable from prior.  He has not taken any laxatives to help him have a bowel movement.  He does feel that he drinks enough water per day and gets enough physical activity.  He eats one banana, apple, and tangerine per day.  Wt Readings from Last 3 Encounters:  03/01/22 262 lb (118.8 kg)  12/11/21 275 lb 9.6 oz (125 kg)  07/19/21 276 lb 6.4 oz (125.4 kg)   Current Outpatient Medications  Medication Sig Dispense Refill    albuterol (VENTOLIN HFA) 108 (90 Base) MCG/ACT inhaler Inhale 2 puffs into the lungs every 6 (six) hours as needed for wheezing or shortness of breath. 8 g 0   gabapentin (NEURONTIN) 300 MG capsule Take 300 mg by mouth 2 (two) times daily as needed.     LamoTRIgine 200 MG TB24 24 hour tablet TAKE TWO TABLETS BY MOUTH AT BEDTIME 180 tablet 4   meloxicam (MOBIC) 7.5 MG tablet Take 7.5 mg by mouth in the morning and at bedtime.     omeprazole (PRILOSEC) 40 MG capsule Take 40 mg by mouth daily.     sertraline (ZOLOFT) 100 MG tablet Take 2 tablets (200 mg total) by mouth daily. 180 tablet 0   No current facility-administered medications for this visit.   Review of Systems: All systems reviewed and negative except where noted in HPI.   Physical Exam: BP 118/70   Pulse 77   Ht '6\' 1"'$  (1.854 m)   Wt 262 lb (118.8 kg)   SpO2 98%   BMI 34.57 kg/m  Constitutional: Pleasant,well-developed, male in no acute distress. HEENT: Normocephalic and atraumatic. Conjunctivae are normal. No scleral icterus. Cardiovascular: Normal rate, regular rhythm.  Pulmonary/chest: Effort normal and breath sounds normal. No wheezing, rales or rhonchi. Abdominal: Soft, nondistended, tender in the RLQ. Bowel sounds active throughout. There are no masses palpable. No hepatomegaly. Extremities: No edema Neurological: Alert and oriented to person  place and time. Skin: Skin is warm and dry. No rashes noted. Psychiatric: Normal mood and affect. Behavior is normal.  Labs 10/2021: CBC unremarkable. CMP with elevated Cr of 1.42. UDS negative. Tylenol level negative. CK level elevated at 337 (nml 46-171). INR nml.  Labs 12/2021: Lipase nml. CRP nml. CK level elevated at 370. Ferritin/IBC, ANA, IgG, ASMA, Hep A Ab, Hep B surface ab, Hep B surface antigen, HCV antibody negative.  CT A/P w/contrast 10/04/21: 1.  No CT evidence of acute abdominal/pelvic process.  2.  No evidence of nephrolithiasis or hydronephrosis.  3.  No  appreciable fracture.  4. 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  5. Degenerate disc disease of the lumbar spine and mild bilateral hip osteoarthritis.   RUQ U/S 12/18/21: IMPRESSION: Diffuse increased echogenicity of the liver parenchyma likely representing hepatic steatosis and/or other hepatocellular disease.  Colonoscopy 01/25/22: - The examined portion of the ileum was normal. - Erythematous mucosa in the sigmoid colon, in the descending colon, in the transverse colon, in the ascending colon and in the cecum. Biopsied. - Three 3 to 7 mm polyps in the transverse colon and in the cecum, removed with a cold snare. Resected and retrieved. - One 5 mm polyp in the sigmoid colon, removed with a cold snare. Resected and retrieved. - Diverticulosis in the sigmoid colon and in the cecum. Biopsied. - Non-bleeding internal hemorrhoids. Path: 1. Surgical [P], colon, transverse and cecum, polyp (3) FINDINGS CONSISTENT WITH TUBULAR ADENOMA. NO DEFINITE HIGH-GRADE DYSPLASIA OR MALIGNANCY IS SEEN. 2. Surgical [P], colon nos, random sites FRAGMENTS OF LARGE BOWEL MUCOSA WITH PROMINENT LYMPHOID AGGREGATES. OTHERWISE NO SIGNIFICANT INFLAMMATION, CHRONIC CRYPT ARCHITECTURAL CHANGES, DYSPLASIA OR MALIGNANCY IS SEEN IN THE CURRENT SPECIMEN. 3. Surgical [P], colon, sigmoid, polyp (1) FINDINGS CONSISTENT WITH HYPERPLASTIC POLYP. NO DEFINITE DYSPLASIA OR MALIGNANCY IS SEEN. CLINICAL AND ENDOSCOPIC CORRELATION IS REQUIRED.  ASSESSMENT AND PLAN: Right sided ab pain Constipation Hemorrhoids Fatty liver History of colon polyps Patient presents for follow-up of right-sided abdominal pain.  Since our last visit, patient had labs that came back with an elevated CK level, which could suggest that his abdominal pain is more related to a MSK etiology.  Alternatively patient has a history of urethral strictures so could have some contribution from that as well.  He had a  colonoscopy that did not show any signs of inflammation within the colon.  Thus at this point I have low suspicion that his abdominal pain is due to a GI etiology.  Following his colonoscopy, the patient does describe that he has developed some rectal pain and constipation.  His rectal pain may be due to hemorrhoids that were seen on his colonoscopy.  We will plan to institute some therapies for constipation and hemorrhoids.  Patient was found to have fatty liver on his most recent ultrasound.  Last set of LFTs was normal.  We will go ahead and give him hepatitis A and B vaccinations today and follow-up in 6 months to recheck his LFTs at that time. - Administer hepatitis A and B vaccinations - Could try eating 2 kiwis per day - Start daily docusate - Can use Miralax PRN to help induce BM. Will give Miralax samples - Anusol HC cream - Give Recticare samples - Can try a lidocaine patch over the right side of the abdomen - Next colonoscopy due in 01/2025 - RTC 6 months  Christia Reading, MD

## 2022-03-01 NOTE — Patient Instructions (Signed)
If you are age 52 or older, your body mass index should be between 23-30. Your Body mass index is 34.57 kg/m. If this is out of the aforementioned range listed, please consider follow up with your Primary Care Provider.  If you are age 70 or younger, your body mass index should be between 19-25. Your Body mass index is 34.57 kg/m. If this is out of the aformentioned range listed, please consider follow up with your Primary Care Provider.   ________________________________________________________  The [facility] [facility] providers would like to encourage you to use [facility] to communicate with providers for non-urgent requests or questions.  Due to long hold times on the telephone, sending your provider a message by [facility] may be a faster and more efficient way to get a response.  Please allow 48 business hours for a response.  Please remember that this is for non-urgent requests.  _______________________________________________________   Please purchase the following medications over the counter and take as directed: Miralax daily  We have given you some Recticare samples.   We have sent the following medications to your pharmacy for you to pick up at your convenience: Anusol twice daily for 7 days.  Drink 8 cups of water a day and walk 30 minutes a day.  Please purchase the following medications over the counter and take as directed: Fiber supplement such as Benefiber- use as directed daily Miralax: Take as  directed up to 3 times a day to achieve regular bowel movements   Follow up in 6 months.  Thank you for entrusting me with your care and choosing [facility].  Dr. Lorenso Courier

## 2022-03-12 ENCOUNTER — Other Ambulatory Visit: Payer: Self-pay | Admitting: Psychiatry

## 2022-03-12 DIAGNOSIS — F333 Major depressive disorder, recurrent, severe with psychotic symptoms: Secondary | ICD-10-CM
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
## 2022-03-27 ENCOUNTER — Telehealth (INDEPENDENT_AMBULATORY_CARE_PROVIDER_SITE_OTHER): Payer: Medicare HMO | Admitting: Psychiatry

## 2022-03-27 ENCOUNTER — Encounter: Payer: Self-pay | Admitting: Psychiatry

## 2022-03-27 DIAGNOSIS — F331 Major depressive disorder, recurrent, moderate: Secondary | ICD-10-CM | POA: Diagnosis not present

## 2022-03-27 NOTE — Patient Instructions (Addendum)
Continue sertraline 200 mg at night   Next appointment- 10/11 at 10 30

## 2022-03-29 DIAGNOSIS — M5116 Intervertebral disc disorders with radiculopathy, lumbar region: Secondary | ICD-10-CM | POA: Diagnosis not present

## 2022-03-29 DIAGNOSIS — G894 Chronic pain syndrome: Secondary | ICD-10-CM | POA: Diagnosis not present

## 2022-04-02 DIAGNOSIS — Z79899 Other long term (current) drug therapy: Secondary | ICD-10-CM | POA: Diagnosis not present

## 2022-04-02 DIAGNOSIS — Z79891 Long term (current) use of opiate analgesic: Secondary | ICD-10-CM | POA: Diagnosis not present

## 2022-04-03 ENCOUNTER — Inpatient Hospital Stay (HOSPITAL_COMMUNITY)
Admission: EM | Admit: 2022-04-03 | Discharge: 2022-04-09 | DRG: 179 | Disposition: A | Discharge status: Home Health | Payer: Medicare HMO | Attending: Internal Medicine | Admitting: Internal Medicine

## 2022-04-03 ENCOUNTER — Emergency Department (HOSPITAL_COMMUNITY): Payer: Medicare HMO

## 2022-04-03 DIAGNOSIS — E876 Hypokalemia: Secondary | ICD-10-CM

## 2022-04-03 DIAGNOSIS — I440 Atrioventricular block, first degree: Secondary | ICD-10-CM | POA: Diagnosis present

## 2022-04-03 DIAGNOSIS — R55 Syncope and collapse: Secondary | ICD-10-CM | POA: Diagnosis present

## 2022-04-03 DIAGNOSIS — Z79899 Other long term (current) drug therapy: Secondary | ICD-10-CM | POA: Diagnosis not present

## 2022-04-03 DIAGNOSIS — R531 Weakness: Secondary | ICD-10-CM | POA: Diagnosis present

## 2022-04-03 DIAGNOSIS — U071 COVID-19: Principal | ICD-10-CM | POA: Diagnosis present

## 2022-04-03 DIAGNOSIS — R4020 Unspecified coma: Secondary | ICD-10-CM

## 2022-04-03 DIAGNOSIS — Z791 Long term (current) use of non-steroidal anti-inflammatories (NSAID): Secondary | ICD-10-CM

## 2022-04-03 DIAGNOSIS — K219 Gastro-esophageal reflux disease without esophagitis: Secondary | ICD-10-CM | POA: Diagnosis present

## 2022-04-03 DIAGNOSIS — F419 Anxiety disorder, unspecified: Secondary | ICD-10-CM | POA: Diagnosis present

## 2022-04-03 DIAGNOSIS — R410 Disorientation, unspecified: Secondary | ICD-10-CM | POA: Diagnosis not present

## 2022-04-03 DIAGNOSIS — R569 Unspecified convulsions: Secondary | ICD-10-CM

## 2022-04-03 DIAGNOSIS — Z8249 Family history of ischemic heart disease and other diseases of the circulatory system: Secondary | ICD-10-CM

## 2022-04-03 DIAGNOSIS — F32A Depression, unspecified: Secondary | ICD-10-CM | POA: Diagnosis present

## 2022-04-03 DIAGNOSIS — E860 Dehydration: Secondary | ICD-10-CM | POA: Diagnosis present

## 2022-04-03 DIAGNOSIS — W19XXXA Unspecified fall, initial encounter: Secondary | ICD-10-CM | POA: Diagnosis not present

## 2022-04-03 DIAGNOSIS — Z833 Family history of diabetes mellitus: Secondary | ICD-10-CM | POA: Diagnosis not present

## 2022-04-03 DIAGNOSIS — Z87891 Personal history of nicotine dependence: Secondary | ICD-10-CM

## 2022-04-03 DIAGNOSIS — R0603 Acute respiratory distress: Secondary | ICD-10-CM

## 2022-04-03 DIAGNOSIS — I959 Hypotension, unspecified: Secondary | ICD-10-CM | POA: Diagnosis not present

## 2022-04-03 DIAGNOSIS — G4730 Sleep apnea, unspecified: Secondary | ICD-10-CM | POA: Diagnosis present

## 2022-04-03 DIAGNOSIS — D696 Thrombocytopenia, unspecified: Secondary | ICD-10-CM | POA: Diagnosis present

## 2022-04-03 LAB — COMPREHENSIVE METABOLIC PANEL
ALT: 17 U/L (ref 0–44)
AST: 17 U/L (ref 15–41)
Albumin: 3.3 g/dL — ABNORMAL LOW (ref 3.5–5.0)
Alkaline Phosphatase: 69 U/L (ref 38–126)
Anion gap: 3 — ABNORMAL LOW (ref 5–15)
BUN: 12 mg/dL (ref 6–20)
CO2: 22 mmol/L (ref 22–32)
Calcium: 6.7 mg/dL — ABNORMAL LOW (ref 8.9–10.3)
Chloride: 116 mmol/L — ABNORMAL HIGH (ref 98–111)
Creatinine, Ser: 1.09 mg/dL (ref 0.61–1.24)
GFR, Estimated: >60 mL/min (ref >60)
Glucose, Bld: 90 mg/dL (ref 70–99)
Potassium: 3.1 mmol/L — ABNORMAL LOW (ref 3.5–5.1)
Sodium: 141 mmol/L (ref 135–145)
Total Bilirubin: 0.7 mg/dL (ref 0.3–1.2)
Total Protein: 5.2 g/dL — ABNORMAL LOW (ref 6.5–8.1)

## 2022-04-03 LAB — CBC WITH DIFFERENTIAL/PLATELET
Abs Immature Granulocytes: 0.01 10*3/uL (ref 0.00–0.07)
Basophils Absolute: 0 10*3/uL (ref 0.0–0.1)
Basophils Relative: 0 %
Eosinophils Absolute: 0 10*3/uL (ref 0.0–0.5)
Eosinophils Relative: 1 %
HCT: 38.2 % — ABNORMAL LOW (ref 39.0–52.0)
Hemoglobin: 13.1 g/dL (ref 13.0–17.0)
Immature Granulocytes: 0 %
Lymphocytes Relative: 19 %
Lymphs Abs: 0.8 10*3/uL (ref 0.7–4.0)
MCH: 29.6 pg (ref 26.0–34.0)
MCHC: 34.3 g/dL (ref 30.0–36.0)
MCV: 86.2 fL (ref 80.0–100.0)
Monocytes Absolute: 0.4 10*3/uL (ref 0.1–1.0)
Monocytes Relative: 10 %
Neutro Abs: 3.2 10*3/uL (ref 1.7–7.7)
Neutrophils Relative %: 70 %
Platelets: 140 10*3/uL — ABNORMAL LOW (ref 150–400)
RBC: 4.43 MIL/uL (ref 4.22–5.81)
RDW: 12 % (ref 11.5–15.5)
WBC: 4.5 10*3/uL (ref 4.0–10.5)
nRBC: 0 % (ref 0.0–0.2)

## 2022-04-03 LAB — CBG MONITORING, ED: Glucose-Capillary: 104 mg/dL — ABNORMAL HIGH (ref 70–99)

## 2022-04-03 LAB — CK: Total CK: 244 U/L (ref 49–397)

## 2022-04-03 LAB — RAPID URINE DRUG SCREEN, HOSP PERFORMED
Amphetamines: NOT DETECTED
Barbiturates: NOT DETECTED
Benzodiazepines: NOT DETECTED
Cocaine: NOT DETECTED
Opiates: NOT DETECTED
Tetrahydrocannabinol: NOT DETECTED

## 2022-04-03 LAB — BLOOD GAS, VENOUS
Acid-Base Excess: 4.5 mmol/L — ABNORMAL HIGH (ref 0.0–2.0)
Bicarbonate: 30.4 mmol/L — ABNORMAL HIGH (ref 20.0–28.0)
Drawn by: 6892
FIO2: 21 %
O2 Saturation: 52.5 %
Patient temperature: 38.1
pCO2, Ven: 51 mmHg (ref 44–60)
pH, Ven: 7.38 (ref 7.25–7.43)
pO2, Ven: 34 mmHg (ref 32–45)

## 2022-04-03 LAB — PROTIME-INR
INR: 1 (ref 0.8–1.2)
Prothrombin Time: 13.1 seconds (ref 11.4–15.2)

## 2022-04-03 LAB — ACETAMINOPHEN LEVEL: Acetaminophen (Tylenol), Serum: <10 ug/mL — ABNORMAL LOW (ref 10–30)

## 2022-04-03 LAB — RESP PANEL BY RT-PCR (FLU A&B, COVID) ARPGX2
Influenza A by PCR: NEGATIVE
Influenza B by PCR: NEGATIVE
SARS Coronavirus 2 by RT PCR: POSITIVE — AB

## 2022-04-03 LAB — TROPONIN I (HIGH SENSITIVITY)
Troponin I (High Sensitivity): 2 ng/L (ref <18)
Troponin I (High Sensitivity): <2 ng/L (ref <18)

## 2022-04-03 LAB — SALICYLATE LEVEL: Salicylate Lvl: <7 mg/dL — ABNORMAL LOW (ref 7.0–30.0)

## 2022-04-03 LAB — ETHANOL: Alcohol, Ethyl (B): <10 mg/dL (ref <10)

## 2022-04-03 LAB — LACTIC ACID, PLASMA: Lactic Acid, Venous: 1.3 mmol/L (ref 0.5–1.9)

## 2022-04-03 MED ORDER — KETOROLAC TROMETHAMINE 15 MG/ML IJ SOLN
15.0000 mg | Freq: Once | INTRAMUSCULAR | Status: AC
  Administered 2022-04-03: 15 mg via INTRAVENOUS
  Filled 2022-04-03: qty 1

## 2022-04-03 MED ORDER — LAMOTRIGINE 100 MG PO TABS
200.0000 mg | ORAL_TABLET | Freq: Every day | ORAL | Status: DC
  Administered 2022-04-04: 200 mg via ORAL
  Filled 2022-04-03: qty 8

## 2022-04-03 MED ORDER — SODIUM CHLORIDE 0.9 % IV BOLUS
1000.0000 mL | Freq: Once | INTRAVENOUS | Status: AC
  Administered 2022-04-03: 1000 mL via INTRAVENOUS

## 2022-04-03 MED ORDER — NIRMATRELVIR/RITONAVIR (PAXLOVID)TABLET
3.0000 | ORAL_TABLET | Freq: Two times a day (BID) | ORAL | Status: AC
  Administered 2022-04-04 – 2022-04-08 (×10): 3 via ORAL
  Filled 2022-04-03 (×2): qty 30

## 2022-04-03 MED ORDER — POTASSIUM CHLORIDE 20 MEQ PO PACK
40.0000 meq | PACK | Freq: Two times a day (BID) | ORAL | Status: DC
  Administered 2022-04-04: 40 meq via ORAL
  Filled 2022-04-03: qty 2

## 2022-04-03 NOTE — H&P (Signed)
History and Physical    Patient: Dale Bender YIR:[id number] DOB: 02-18-1971 DOA: 04/03/2022 DOS: the patient was seen and examined on 04/04/2022 PCP: Denny Levy, [place]  Patient coming from: Home  Chief Complaint:  Chief Complaint  Patient presents with   Altered Mental Status   HPI: Dale Bender is a 51 y.o. male with medical history significant of GERD, depression, seizures who presents to the emergency department via EMS after witnessed collapse.  Patient states that the last thing he remembered was that he was at church.  Most of the history was obtained from ED physician and ED medical record.  Per report, patient was reported to have collapsed to the ground at church.  Family member at the scene provided CPR, EMS was activated and on arrival of EMS team, patient was awake, but with altered mental status.  Rescue breaths via bagging was prior via EMS en route.  Patient was reported to be tachypneic, confused, febrile at 100.15F, but was without any breathing difficulty on arrival to the ED.  ED Course:  In the emergency department, he was intermittently tachypneic, but other vital signs were within normal range.  Work-up in the ED showed normal CBC except for hematocrit of 38.2 and platelets 140.  BMP was normal except for potassium 3.1 and chloride 116, calcium 6.7, albumin 3.3, alcohol level was less than 10, salicylate less than 7, acetaminophen level less than 10, lactic acid was normal, total CK2 44, troponin x2 was negative, urine drug screen was was normal.  Influenza A, B was negative, SARS coronavirus 2 was positive. CT head without contrast and CT cervical spine without contrast showed negative noncontrasted CT appearance of the brain and degenerative changes of the cervical spine.  No acute osseous abnormality. Chest x-ray showed low volumes.  No active cardiopulmonary disease. Patient was treated with Toradol.  Hospitalist was asked to admit patient for further evaluation  and management.  Review of Systems: Review of systems as noted in the HPI. All other systems reviewed and are negative.   Past Medical History:  Diagnosis Date   Anxiety    Arthritis    Back pain    Depression, major, recurrent, moderate (HCC)    GERD (gastroesophageal reflux disease)    History of seizures    Lower extremity weakness    Prostate nodule    Benign   Sleep apnea    Suicidal ideation    Umbilical hernia    Urethral stricture in male    Past Surgical History:  Procedure Laterality Date   FRACTURE SURGERY     LT arm, LT leg, Rt leg   PROSTATE BIOPSY     UMBILICAL HERNIA REPAIR N/A 12/03/2019   Procedure: HERNIA REPAIR UMBILICAL ADULT/ WITH MESH;  Surgeon: Aviva Signs, MD;  Location: [facility];  Service: General;  Laterality: N/A;   URETHRAL STRICTURE DILATATION      Social History:  reports that he quit smoking about 19 years ago. His smoking use included cigarettes. He has a 5.00 pack-year smoking history. He has never used smokeless tobacco. He reports that he does not drink alcohol and does not use drugs.   No Known Allergies  Family History  Problem Relation Age of Onset   Hypertension Mother    Diabetes Mother    Other Father        died at age 35 in accident - log fell on him   Sleep apnea Sister    Kidney cancer Maternal Aunt  Prostate cancer Paternal Uncle    Colon cancer Neg Hx    Esophageal cancer Neg Hx    Liver cancer Neg Hx    Pancreatic cancer Neg Hx      Prior to Admission medications   Medication Sig Start Date End Date Taking? Authorizing Provider  albuterol (VENTOLIN HFA) 108 (90 Base) MCG/ACT inhaler Inhale 2 puffs into the lungs every 6 (six) hours as needed for wheezing or shortness of breath. 08/06/19  Yes Shah, Pratik D, DO  buprenorphine (BUTRANS) 10 MCG/HR PTWK 1 patch once a week. 03/29/22  Yes [provider]  LamoTRIgine 200 MG TB24 24 hour tablet TAKE TWO TABLETS BY MOUTH AT BEDTIME 03/27/21  Yes Marcial Pacas, MD   meloxicam (MOBIC) 7.5 MG tablet Take 7.5 mg by mouth in the morning and at bedtime.   Yes [provider]  omeprazole (PRILOSEC) 40 MG capsule Take 40 mg by mouth daily. 07/14/19  Yes [provider]  sertraline (ZOLOFT) 100 MG tablet TAKE 2 TABLETS BY MOUTH EVERY DAY 03/13/22  Yes Hisada, Reina, MD  ZTLIDO 1.8 % PTCH Apply 1 patch topically 3 (three) times daily. 04/01/22  Yes [provider]  gabapentin (NEURONTIN) 300 MG capsule Take 300 mg by mouth 2 (two) times daily as needed. Patient not taking: Reported on 04/03/2022 11/02/21   [provider]    Physical Exam: BP (!) 134/97   Pulse 70   Temp 98.6 F (37 C) (Oral)   Resp (!) 22   SpO2 100%   General: 51 y.o. year-old male well developed well nourished in no acute distress.  Alert and oriented x3. HEENT: NCAT, EOMI Neck: Supple, trachea medial Cardiovascular: Regular rate and rhythm with no rubs or gallops.  No thyromegaly or JVD noted.  No lower extremity edema. 2/4 pulses in all 4 extremities. Respiratory: Clear to auscultation with no wheezes or rales. Good inspiratory effort. Abdomen: Soft, nontender nondistended with normal bowel sounds x4 quadrants. Muskuloskeletal: No cyanosis, clubbing or edema noted bilaterally Neuro: CN II-XII intact, sensation, reflexes intact Skin: No ulcerative lesions noted or rashes Psychiatry: Mood is appropriate for condition and setting          Labs on Admission:  Basic Metabolic Panel: Recent Labs  Lab 04/03/22 1948  NA 141  K 3.1*  CL 116*  CO2 22  GLUCOSE 90  BUN 12  CREATININE 1.09  CALCIUM 6.7*   Liver Function Tests: Recent Labs  Lab 04/03/22 1948  AST 17  ALT 17  ALKPHOS 69  BILITOT 0.7  PROT 5.2*  ALBUMIN 3.3*   No results for input(s): "LIPASE", "AMYLASE" in the last 168 hours. No results for input(s): "AMMONIA" in the last 168 hours. CBC: Recent Labs  Lab 04/03/22 1948  WBC 4.5  NEUTROABS 3.2  HGB 13.1  HCT 38.2*  MCV  86.2  PLT 140*   Cardiac Enzymes: Recent Labs  Lab 04/03/22 2149  CKTOTAL 244    BNP (last 3 results) No results for input(s): "BNP" in the last 8760 hours.  ProBNP (last 3 results) No results for input(s): "PROBNP" in the last 8760 hours.  CBG: Recent Labs  Lab 04/03/22 1947  GLUCAP 104*    Radiological Exams on Admission: CT Head Wo Contrast  Result Date: 04/03/2022 CLINICAL DATA:  Unresponsive episode EXAM: CT HEAD WITHOUT CONTRAST CT CERVICAL SPINE WITHOUT CONTRAST TECHNIQUE: Multidetector CT imaging of the head and cervical spine was performed following the standard protocol without intravenous contrast. Multiplanar CT image  reconstructions of the cervical spine were also generated. RADIATION DOSE REDUCTION: This exam was performed according to the departmental dose-optimization program which includes automated exposure control, adjustment of the mA and/or kV according to patient size and/or use of iterative reconstruction technique. COMPARISON:  MRI 11/18/2020, CT brain 10/04/2021 FINDINGS: CT HEAD FINDINGS Brain: No acute territorial infarction, hemorrhage or intracranial mass. The ventricles are nonenlarged Vascular: No hyperdense vessels.  No unexpected calcification Skull: Normal. Negative for fracture or focal lesion. Sinuses/Orbits: No acute finding. Other: None CT CERVICAL SPINE FINDINGS Alignment: No subluxation.  Facet alignment within normal limits. Skull base and vertebrae: No acute fracture. No primary bone lesion or focal pathologic process. Soft tissues and spinal canal: No prevertebral fluid or swelling. No visible canal hematoma. Disc levels: Multilevel degenerative change. Mild to moderate diffuse disc space narrowing with multilevel bulky osteophyte. Multilevel bilateral foraminal narrowing. Upper chest: Negative. Other: None IMPRESSION: 1. Negative non contrasted CT appearance of the brain 2. Degenerative changes of the cervical spine. No acute osseous abnormality  Electronically Signed   By: Donavan Foil M.D.   On: 04/03/2022 21:06   CT Cervical Spine Wo Contrast  Result Date: 04/03/2022 CLINICAL DATA:  Unresponsive episode EXAM: CT HEAD WITHOUT CONTRAST CT CERVICAL SPINE WITHOUT CONTRAST TECHNIQUE: Multidetector CT imaging of the head and cervical spine was performed following the standard protocol without intravenous contrast. Multiplanar CT image reconstructions of the cervical spine were also generated. RADIATION DOSE REDUCTION: This exam was performed according to the departmental dose-optimization program which includes automated exposure control, adjustment of the mA and/or kV according to patient size and/or use of iterative reconstruction technique. COMPARISON:  MRI 11/18/2020, CT brain 10/04/2021 FINDINGS: CT HEAD FINDINGS Brain: No acute territorial infarction, hemorrhage or intracranial mass. The ventricles are nonenlarged Vascular: No hyperdense vessels.  No unexpected calcification Skull: Normal. Negative for fracture or focal lesion. Sinuses/Orbits: No acute finding. Other: None CT CERVICAL SPINE FINDINGS Alignment: No subluxation.  Facet alignment within normal limits. Skull base and vertebrae: No acute fracture. No primary bone lesion or focal pathologic process. Soft tissues and spinal canal: No prevertebral fluid or swelling. No visible canal hematoma. Disc levels: Multilevel degenerative change. Mild to moderate diffuse disc space narrowing with multilevel bulky osteophyte. Multilevel bilateral foraminal narrowing. Upper chest: Negative. Other: None IMPRESSION: 1. Negative non contrasted CT appearance of the brain 2. Degenerative changes of the cervical spine. No acute osseous abnormality Electronically Signed   By: Donavan Foil M.D.   On: 04/03/2022 21:06   DG Chest Port 1 View  Result Date: 04/03/2022 CLINICAL DATA:  Unresponsive episode EXAM: PORTABLE CHEST 1 VIEW COMPARISON:  10/06/2018 FINDINGS: Low lung volumes. Heart and mediastinal  contours are within normal limits. No focal opacities or effusions. No acute bony abnormality. IMPRESSION: Low volumes.  No active cardiopulmonary disease. Electronically Signed   By: Rolm Baptise M.D.   On: 04/03/2022 20:46    EKG: I independently viewed the EKG done and my findings are as followed: Normal sinus rhythm at a rate of 99 bpm with prolonged PR interval (215 ms)  Assessment/Plan Present on Admission:  COVID-19 virus infection  GERD (gastroesophageal reflux disease)  Principal Problem:   Syncope and collapse Active Problems:   GERD (gastroesophageal reflux disease)   Seizure (HCC)   COVID-19 virus infection   Hypokalemia   Thrombocytopenia (HCC)   Hypocalcemia  Syncope and collapse Continue telemetry and watch for arrhythmias Troponins x2 was negative EKG personally reviewed showed normal sinus rhythm at rate  of 99 bpm with prolonged PR interval Echocardiogram done on 07/28/2019 showed LVEF of 60 to 65%.  Mildly increased LVH, LV has normal function.  G1 DD.  No RWMA. Echocardiogram will be done to rule out significant aortic stenosis or other outflow obstruction, and also to evaluate EF and to rule out segmental/Regional wall motion abnormalities.  Carotid artery Dopplers will be done to rule out hemodynamically significant stenosis  COVID-19 virus infection Subsequent virus to was positive Continue Paxlovid,   Hypokalemia K+ 3.1, this was replenished  Thrombocytopenia Platelets 140, continue to monitor platelet levels  Hypocalcemia Corrected calcium level for albumin is 7.3 Continue Os-Cal  History of seizures Continue Lamictal  GERD Continue Protonix  Depression Continue Zoloft  DVT prophylaxis: Lovenox  Code Status: Full code  Consults: None  Family Communication: None at bedside  Severity of Illness: The appropriate patient status for this patient is OBSERVATION. Observation status is judged to be reasonable and necessary in order to provide  the required intensity of service to ensure the patient's safety. The patient's presenting symptoms, physical exam findings, and initial radiographic and laboratory data in the context of their medical condition is felt to place them at decreased risk for further clinical deterioration. Furthermore, it is anticipated that the patient will be medically stable for discharge from the hospital within 2 midnights of admission.   Author: Bernadette Hoit, DO 04/04/2022 4:58 AM  For on call review [email or web address].

## 2022-04-03 NOTE — ED Triage Notes (Signed)
Pt [place] from [facility] after pt had a unresponsive episode. Friend started compressions on pt but pt had pulse and was minimally responsive when EMS arrived on scene.   Pt had second unresponsive episode with agonal respirations with EMS. Pt being bagged on arrival. EDP and RT at bedside.

## 2022-04-03 NOTE — ED Provider Notes (Signed)
[facility] EMERGENCY DEPARTMENT Provider Note   CSN: [id number] Arrival date & time: 04/03/22  1944     History  Chief Complaint  Patient presents with   Altered Mental Status    Dale Bender is a 51 y.o. male.  Patient is a 51 year old male presenting via EMS after witnessed collapse.  Patient was reported to be at his church when he collapsed to the ground.  He received bystander CPR from a family member.  When EMS arrived patient was receiving CPR but awake and altered.  Patient received rescue breaths via bagging by EMS.  On arrival to the emergency department patient is tachypneic but otherwise breathing on his own without difficulty.  He is confused.  Febrile at 100.5 F.  Has a history of seizures but had no reported seizure-like activity.    The history is provided by the patient. No language interpreter was used.  Altered Mental Status Associated symptoms: fever   Associated symptoms: no abdominal pain, no nausea, no palpitations, no rash, no seizures and no vomiting        Home Medications Prior to Admission medications   Medication Sig Start Date End Date Taking? Authorizing Provider  albuterol (VENTOLIN HFA) 108 (90 Base) MCG/ACT inhaler Inhale 2 puffs into the lungs every 6 (six) hours as needed for wheezing or shortness of breath. 08/06/19  Yes Shah, Pratik D, DO  buprenorphine (BUTRANS) 10 MCG/HR PTWK 1 patch once a week. 03/29/22  Yes [provider]  LamoTRIgine 200 MG TB24 24 hour tablet TAKE TWO TABLETS BY MOUTH AT BEDTIME 03/27/21  Yes Marcial Pacas, MD  meloxicam (MOBIC) 7.5 MG tablet Take 7.5 mg by mouth in the morning and at bedtime.   Yes [provider]  omeprazole (PRILOSEC) 40 MG capsule Take 40 mg by mouth daily. 07/14/19  Yes [provider]  sertraline (ZOLOFT) 100 MG tablet TAKE 2 TABLETS BY MOUTH EVERY DAY 03/13/22  Yes Hisada, Reina, MD  ZTLIDO 1.8 % PTCH Apply 1 patch topically 3 (three) times daily. 04/01/22  Yes  [provider]  gabapentin (NEURONTIN) 300 MG capsule Take 300 mg by mouth 2 (two) times daily as needed. Patient not taking: Reported on 04/03/2022 11/02/21   [provider]      Allergies    Patient has no known allergies.    Review of Systems   Review of Systems  Constitutional:  Positive for fever. Negative for chills.  HENT:  Positive for sore throat. Negative for congestion and ear pain.   Eyes:  Negative for pain and visual disturbance.  Respiratory:  Negative for cough and shortness of breath.   Cardiovascular:  Negative for chest pain and palpitations.  Gastrointestinal:  Negative for abdominal pain, constipation, diarrhea, nausea and vomiting.  Genitourinary:  Negative for dysuria and hematuria.  Musculoskeletal:  Negative for arthralgias and back pain.  Skin:  Negative for color change and rash.  Neurological:  Negative for seizures and syncope.  All other systems reviewed and are negative.   Physical Exam Updated Vital Signs BP 128/78   Pulse 83   Temp 98.6 F (37 C) (Oral)   Resp 17   SpO2 95%  Physical Exam Vitals and nursing note reviewed.  Constitutional:      General: He is not in acute distress.    Appearance: He is well-developed.  HENT:     Head: Normocephalic and atraumatic.  Eyes:     Conjunctiva/sclera: Conjunctivae normal.  Cardiovascular:  Rate and Rhythm: Normal rate and regular rhythm.     Heart sounds: No murmur heard. Pulmonary:     Effort: Pulmonary effort is normal. No respiratory distress.     Breath sounds: Normal breath sounds.  Abdominal:     Palpations: Abdomen is soft.     Tenderness: There is no abdominal tenderness.  Musculoskeletal:        General: No swelling.     Cervical back: Neck supple.  Skin:    General: Skin is warm and dry.     Capillary Refill: Capillary refill takes less than 2 seconds.  Neurological:     Mental Status: He is lethargic.     GCS: GCS eye subscore is 4. GCS verbal subscore  is 4. GCS motor subscore is 6.  Psychiatric:        Mood and Affect: Mood normal.     ED Results / Procedures / Treatments   Labs (all labs ordered are listed, but only abnormal results are displayed) Labs Reviewed  RESP PANEL BY RT-PCR (FLU A&B, COVID) ARPGX2 - Abnormal; Notable for the following components:      Result Value   SARS Coronavirus 2 by RT PCR POSITIVE (*)    All other components within normal limits  CBC WITH DIFFERENTIAL/PLATELET - Abnormal; Notable for the following components:   HCT 38.2 (*)    Platelets 140 (*)    All other components within normal limits  COMPREHENSIVE METABOLIC PANEL - Abnormal; Notable for the following components:   Potassium 3.1 (*)    Chloride 116 (*)    Calcium 6.7 (*)    Total Protein 5.2 (*)    Albumin 3.3 (*)    Anion gap 3 (*)    All other components within normal limits  BLOOD GAS, VENOUS - Abnormal; Notable for the following components:   Bicarbonate 30.4 (*)    Acid-Base Excess 4.5 (*)    All other components within normal limits  SALICYLATE LEVEL - Abnormal; Notable for the following components:   Salicylate Lvl <7.9 (*)    All other components within normal limits  ACETAMINOPHEN LEVEL - Abnormal; Notable for the following components:   Acetaminophen (Tylenol), Serum <10 (*)    All other components within normal limits  CBG MONITORING, ED - Abnormal; Notable for the following components:   Glucose-Capillary 104 (*)    All other components within normal limits  ETHANOL  RAPID URINE DRUG SCREEN, HOSP PERFORMED  LACTIC ACID, PLASMA  PROTIME-INR  CK  LAMOTRIGINE LEVEL  TROPONIN I (HIGH SENSITIVITY)  TROPONIN I (HIGH SENSITIVITY)    EKG EKG Interpretation  Date/Time:  Wednesday April 03 2022 19:47:32 EDT Ventricular Rate:  99 PR Interval:  215 QRS Duration: 73 QT Interval:  303 QTC Calculation: 389 R Axis:   57 Text Interpretation: Sinus rhythm Prolonged PR interval Probable anteroseptal infarct, old Confirmed  by Campbell Stall ([id number]) on 08/30/4172 10:59:17 PM  Radiology CT Head Wo Contrast  Result Date: 04/03/2022 CLINICAL DATA:  Unresponsive episode EXAM: CT HEAD WITHOUT CONTRAST CT CERVICAL SPINE WITHOUT CONTRAST TECHNIQUE: Multidetector CT imaging of the head and cervical spine was performed following the standard protocol without intravenous contrast. Multiplanar CT image reconstructions of the cervical spine were also generated. RADIATION DOSE REDUCTION: This exam was performed according to the departmental dose-optimization program which includes automated exposure control, adjustment of the mA and/or kV according to patient size and/or use of iterative reconstruction technique. COMPARISON:  MRI 11/18/2020, CT brain 10/04/2021 FINDINGS: CT  HEAD FINDINGS Brain: No acute territorial infarction, hemorrhage or intracranial mass. The ventricles are nonenlarged Vascular: No hyperdense vessels.  No unexpected calcification Skull: Normal. Negative for fracture or focal lesion. Sinuses/Orbits: No acute finding. Other: None CT CERVICAL SPINE FINDINGS Alignment: No subluxation.  Facet alignment within normal limits. Skull base and vertebrae: No acute fracture. No primary bone lesion or focal pathologic process. Soft tissues and spinal canal: No prevertebral fluid or swelling. No visible canal hematoma. Disc levels: Multilevel degenerative change. Mild to moderate diffuse disc space narrowing with multilevel bulky osteophyte. Multilevel bilateral foraminal narrowing. Upper chest: Negative. Other: None IMPRESSION: 1. Negative non contrasted CT appearance of the brain 2. Degenerative changes of the cervical spine. No acute osseous abnormality Electronically Signed   By: Donavan Foil M.D.   On: 04/03/2022 21:06   CT Cervical Spine Wo Contrast  Result Date: 04/03/2022 CLINICAL DATA:  Unresponsive episode EXAM: CT HEAD WITHOUT CONTRAST CT CERVICAL SPINE WITHOUT CONTRAST TECHNIQUE: Multidetector CT imaging of the head and  cervical spine was performed following the standard protocol without intravenous contrast. Multiplanar CT image reconstructions of the cervical spine were also generated. RADIATION DOSE REDUCTION: This exam was performed according to the departmental dose-optimization program which includes automated exposure control, adjustment of the mA and/or kV according to patient size and/or use of iterative reconstruction technique. COMPARISON:  MRI 11/18/2020, CT brain 10/04/2021 FINDINGS: CT HEAD FINDINGS Brain: No acute territorial infarction, hemorrhage or intracranial mass. The ventricles are nonenlarged Vascular: No hyperdense vessels.  No unexpected calcification Skull: Normal. Negative for fracture or focal lesion. Sinuses/Orbits: No acute finding. Other: None CT CERVICAL SPINE FINDINGS Alignment: No subluxation.  Facet alignment within normal limits. Skull base and vertebrae: No acute fracture. No primary bone lesion or focal pathologic process. Soft tissues and spinal canal: No prevertebral fluid or swelling. No visible canal hematoma. Disc levels: Multilevel degenerative change. Mild to moderate diffuse disc space narrowing with multilevel bulky osteophyte. Multilevel bilateral foraminal narrowing. Upper chest: Negative. Other: None IMPRESSION: 1. Negative non contrasted CT appearance of the brain 2. Degenerative changes of the cervical spine. No acute osseous abnormality Electronically Signed   By: Donavan Foil M.D.   On: 04/03/2022 21:06   DG Chest Port 1 View  Result Date: 04/03/2022 CLINICAL DATA:  Unresponsive episode EXAM: PORTABLE CHEST 1 VIEW COMPARISON:  10/06/2018 FINDINGS: Low lung volumes. Heart and mediastinal contours are within normal limits. No focal opacities or effusions. No acute bony abnormality. IMPRESSION: Low volumes.  No active cardiopulmonary disease. Electronically Signed   By: Rolm Baptise M.D.   On: 04/03/2022 20:46    Procedures Procedures    Medications Ordered in  ED Medications  nirmatrelvir/ritonavir EUA (PAXLOVID) 3 tablet (has no administration in time range)  potassium chloride (KLOR-CON) packet 40 mEq (has no administration in time range)  lamoTRIgine (LAMICTAL) tablet 200 mg (has no administration in time range)  ketorolac (TORADOL) 15 MG/ML injection 15 mg (15 mg Intravenous Given 04/03/22 2120)  sodium chloride 0.9 % bolus 1,000 mL (0 mLs Intravenous Stopped 04/03/22 2202)    ED Course/ Medical Decision Making/ A&P                           Medical Decision Making Amount and/or Complexity of Data Reviewed Labs: ordered. Radiology: ordered.  Risk Prescription drug management.   85:27 PM 51 year old male presenting via EMS after witnessed collapse.  On arrival patient is breathing on his own but tachypneic,  febrile at 100.5, confused, with a GCS of 14.    Equal bilateral breath sounds with no adventitious lung sounds.  No hypoxia.  Patient is lethargic and confused on exam.  I independently interpreted patient's labs and EKG.  Twelve-lead EKG demonstrates normal sinus rhythm with no ST segment elevation or depression.  Troponin within normal limits.  Stable electrolytes.  Stable chest x-ray with no acute process.  CT head and neck demonstrate no acute process.  Laboratory studies demonstrate no leukocytosis.  No signs or symptoms of sepsis.  Patient COVID 19 positive.  Patient is likely happen secondary to fever and being outside.  He is currently breathing without difficulty, not hypoxic, sitting comfortably in the bed.  He is back to his baseline, alert and oriented x3, and moving without difficulty.  Recommending admission at this time for syncope with collapse likely secondary to COVID-19 virus with altered mental status and original respiratory distress on arrival.  Patient accepted by admitting team.        Final Clinical Impression(s) / ED Diagnoses Final diagnoses:  COVID  LOC (loss of consciousness) [facility])  Respiratory  distress    Rx / DC Orders ED Discharge Orders     None         Lianne Cure, DO 45/03/88 [id number]

## 2022-04-04 ENCOUNTER — Encounter (HOSPITAL_COMMUNITY): Payer: Self-pay | Admitting: Internal Medicine

## 2022-04-04 ENCOUNTER — Other Ambulatory Visit (HOSPITAL_COMMUNITY): Payer: Self-pay | Admitting: *Deleted

## 2022-04-04 ENCOUNTER — Observation Stay (HOSPITAL_COMMUNITY): Payer: Medicare HMO

## 2022-04-04 ENCOUNTER — Observation Stay (HOSPITAL_BASED_OUTPATIENT_CLINIC_OR_DEPARTMENT_OTHER): Payer: Medicare HMO

## 2022-04-04 ENCOUNTER — Other Ambulatory Visit: Payer: Self-pay

## 2022-04-04 DIAGNOSIS — U071 COVID-19: Secondary | ICD-10-CM | POA: Diagnosis not present

## 2022-04-04 DIAGNOSIS — R55 Syncope and collapse: Secondary | ICD-10-CM | POA: Diagnosis not present

## 2022-04-04 DIAGNOSIS — K219 Gastro-esophageal reflux disease without esophagitis: Secondary | ICD-10-CM | POA: Diagnosis not present

## 2022-04-04 DIAGNOSIS — D696 Thrombocytopenia, unspecified: Secondary | ICD-10-CM

## 2022-04-04 DIAGNOSIS — E876 Hypokalemia: Secondary | ICD-10-CM

## 2022-04-04 LAB — ECHOCARDIOGRAM COMPLETE
AR max vel: 2.97 cm2
AV Area VTI: 2.75 cm2
AV Area mean vel: 2.59 cm2
AV Mean grad: 4 mmHg
AV Peak grad: 7.8 mmHg
Ao pk vel: 1.4 m/s
Area-P 1/2: 3.87 cm2
Calc EF: 59.4 %
MV VTI: 3.59 cm2
S' Lateral: 3.2 cm
Single Plane A2C EF: 60.3 %
Single Plane A4C EF: 54.7 %

## 2022-04-04 LAB — BASIC METABOLIC PANEL
Anion gap: 5 (ref 5–15)
BUN: 13 mg/dL (ref 6–20)
CO2: 27 mmol/L (ref 22–32)
Calcium: 8.6 mg/dL — ABNORMAL LOW (ref 8.9–10.3)
Chloride: 107 mmol/L (ref 98–111)
Creatinine, Ser: 1.31 mg/dL — ABNORMAL HIGH (ref 0.61–1.24)
GFR, Estimated: >60 mL/min (ref >60)
Glucose, Bld: 104 mg/dL — ABNORMAL HIGH (ref 70–99)
Potassium: 4.4 mmol/L (ref 3.5–5.1)
Sodium: 139 mmol/L (ref 135–145)

## 2022-04-04 LAB — HIV ANTIBODY (ROUTINE TESTING W REFLEX): HIV Screen 4th Generation wRfx: NONREACTIVE

## 2022-04-04 MED ORDER — ALBUTEROL SULFATE HFA 108 (90 BASE) MCG/ACT IN AERS: 2.0000 | INHALATION_SPRAY | Freq: Four times a day (QID) | RESPIRATORY_TRACT | Status: DC | PRN

## 2022-04-04 MED ORDER — LAMOTRIGINE ER 100 MG PO TB24
400.0000 mg | ORAL_TABLET | Freq: Every day | ORAL | Status: DC
  Administered 2022-04-04 – 2022-04-08 (×5): 400 mg via ORAL
  Filled 2022-04-04 (×6): qty 4
  Filled 2022-04-04 (×2): qty 8

## 2022-04-04 MED ORDER — KETOROLAC TROMETHAMINE 15 MG/ML IJ SOLN
15.0000 mg | Freq: Once | INTRAMUSCULAR | Status: AC
  Administered 2022-04-04: 15 mg via INTRAVENOUS
  Filled 2022-04-04: qty 1

## 2022-04-04 MED ORDER — ACETAMINOPHEN 325 MG PO TABS
650.0000 mg | ORAL_TABLET | Freq: Four times a day (QID) | ORAL | Status: DC | PRN
  Administered 2022-04-04 (×2): 650 mg via ORAL
  Filled 2022-04-04 (×2): qty 2

## 2022-04-04 MED ORDER — ENOXAPARIN SODIUM 40 MG/0.4ML IJ SOSY
40.0000 mg | PREFILLED_SYRINGE | INTRAMUSCULAR | Status: DC
  Administered 2022-04-04 – 2022-04-09 (×6): 40 mg via SUBCUTANEOUS
  Filled 2022-04-04 (×6): qty 0.4

## 2022-04-04 MED ORDER — PANTOPRAZOLE SODIUM 40 MG PO TBEC
40.0000 mg | DELAYED_RELEASE_TABLET | Freq: Every day | ORAL | Status: DC
  Administered 2022-04-04 – 2022-04-09 (×6): 40 mg via ORAL
  Filled 2022-04-04 (×6): qty 1

## 2022-04-04 MED ORDER — CALCIUM CARBONATE 1250 (500 CA) MG PO TABS
1.0000 | ORAL_TABLET | Freq: Every day | ORAL | Status: DC
Start: 2022-04-04 — End: 2022-04-04
  Filled 2022-04-04 (×2): qty 1

## 2022-04-04 MED ORDER — SERTRALINE HCL 50 MG PO TABS
200.0000 mg | ORAL_TABLET | Freq: Every day | ORAL | Status: DC
  Administered 2022-04-04 – 2022-04-08 (×5): 200 mg via ORAL
  Filled 2022-04-04 (×6): qty 4

## 2022-04-04 MED ORDER — ACETAMINOPHEN 650 MG RE SUPP: 650.0000 mg | Freq: Four times a day (QID) | RECTAL | Status: DC | PRN

## 2022-04-04 MED ORDER — CALCIUM CARBONATE 1250 (500 CA) MG PO TABS
500.0000 mg | ORAL_TABLET | Freq: Every day | ORAL | Status: DC
  Administered 2022-04-04 – 2022-04-09 (×6): 1250 mg via ORAL
  Filled 2022-04-04 (×7): qty 1

## 2022-04-04 MED ORDER — POTASSIUM CHLORIDE 20 MEQ PO PACK
40.0000 meq | PACK | Freq: Two times a day (BID) | ORAL | Status: AC
Start: 2022-04-04 — End: 2022-04-04
  Administered 2022-04-04 (×2): 40 meq via ORAL
  Filled 2022-04-04 (×2): qty 2

## 2022-04-04 NOTE — Progress Notes (Signed)
   04/04/22 1815  Assess: MEWS Score  Temp (!) 102.5 F (39.2 C)  Assess: MEWS Score  MEWS Temp 2  MEWS Systolic 0  MEWS Pulse 0  MEWS RR 0  MEWS LOC 0  MEWS Score 2  MEWS Score Color Yellow  Assess: if the MEWS score is Yellow or Red  Were vital signs taken at a resting state? Yes  Focused Assessment Change from prior assessment (see assessment flowsheet)  Does the patient meet 2 or more of the SIRS criteria? Yes  Treat  Pain Score 0  Take Vital Signs  Increase Vital Sign Frequency  Yellow: Q 2hr X 2 then Q 4hr X 2, if remains yellow, continue Q 4hrs  Notify: Charge Nurse/RN  Name of Charge Nurse/RN Notified Raquel Sarna  Date Charge Nurse/RN Notified 04/04/22  Time Charge Nurse/RN Notified 1900  Notify: Provider  Provider Name/Title Akula  Date Provider Notified 04/04/22  Time Provider Notified 1815  Method of Notification Page  Provider response Other (Comment) (said ok to give tylenol early)  Date of Provider Response 04/04/22  Time of Provider Response 1815  Document  Patient Outcome Other (Comment) (will check temp again)  Progress note created (see row info) Yes  Assess: SIRS CRITERIA  SIRS Temperature  1  SIRS Pulse 1  SIRS Respirations  0  SIRS WBC 1  SIRS Score Sum  3

## 2022-04-04 NOTE — Progress Notes (Signed)
PROGRESS NOTE    Dale Bender  TIR:[id number] DOB: March 08, 1971 DOA: 04/03/2022 PCP: Denny Levy, PA    Chief Complaint  Patient presents with   Altered Mental Status    Brief Narrative:  Dale Bender is a 51 y.o. male with medical history significant of GERD, depression, seizures who presents to the emergency department via EMS after witnessed collapse.  Patient states that the last thing he remembered was that he was at church.   Patient was reported to be tachypneic, confused, febrile at 100.35F, but was without any breathing difficulty on arrival to the ED.  SARS coronavirus 2 was positive. CT head without contrast and CT cervical spine without contrast showed negative noncontrasted CT appearance of the brain and degenerative changes of the cervical spine.  No acute osseous abnormality. Chest x-ray showed low volumes.  No active cardiopulmonary disease  Patient seen and examined this am.  He reports feeling sore all over.  No chest pain or sob at this time.   Assessment & Plan:   Principal Problem:   Syncope and collapse Active Problems:   GERD (gastroesophageal reflux disease)   Seizure (HCC)   COVID-19 virus infection   Hypokalemia   Thrombocytopenia (HCC)   Hypocalcemia   Syncope and collapse Unclear etiology, suspect from dehydration from COVID 19 Infection.  Monitor on telemetry to evaluate for arrhythmias.  Echo is unremarkable.  Will check for orthostatic vital signs today.  Carotid duplex is pending.  He is alert and oriented and appears to be back to baseline mental status.  EKG shows NSR and prolonged PR interval.     COVID 19 INFECTION.  Currently on Paxlovid.    Hypokalemia replaced.    Mild thrombocytopenia:  Monitor. Recheck in am.    GERD: Stable. Continue with PPI.    Depression  Continue with zoloft.    DVT prophylaxis: lovenox.  Code Status: full code.  Family Communication: none at bedside.  Disposition:   Status  is: Observation The patient will require care spanning > 2 midnights and should be moved to inpatient because: syncope work up and [place].    Level of care: Telemetry Consultants:  NONE.   Procedures: NONE.   Antimicrobials: Paxlovid.    Subjective: Feeling sore all over , no sob or chest pain. No cough. Nausea or vomiting.   Objective: Vitals:   04/03/22 2326 04/04/22 0230 04/04/22 0400 04/04/22 0700  BP:  128/76 (!) 134/97 128/83  Pulse:  70 70 96  Resp:  18 (!) 22 (!) 24  Temp: 98.6 F (37 C)   98.4 F (36.9 C)  TempSrc: Oral   Oral  SpO2:  93% 100% 100%   No intake or output data in the 24 hours ending 04/04/22 0937 There were no vitals filed for this visit.  Examination:  General exam: Appears calm and comfortable  Respiratory system: Clear to auscultation. Respiratory effort normal. Cardiovascular system: S1 & S2 heard, RRR. No JVD, murmurs, No pedal edema. Gastrointestinal system: Abdomen is nondistended, soft and nontender. Normal bowel sounds heard. Central nervous system: Alert and oriented. No focal neurological deficits. Extremities: Symmetric 5 x 5 power. Skin: No rashes, lesions or ulcers Psychiatry:  Mood & affect appropriate.     Data Reviewed: I have personally reviewed following labs and imaging studies  CBC: Recent Labs  Lab 04/03/22 1948  WBC 4.5  NEUTROABS 3.2  HGB 13.1  HCT 38.2*  MCV 86.2  PLT 140*    Basic Metabolic Panel:  Recent Labs  Lab 04/03/22 1948  NA 141  K 3.1*  CL 116*  CO2 22  GLUCOSE 90  BUN 12  CREATININE 1.09  CALCIUM 6.7*    GFR: CrCl cannot be calculated (Unknown ideal weight.).  Liver Function Tests: Recent Labs  Lab 04/03/22 1948  AST 17  ALT 17  ALKPHOS 69  BILITOT 0.7  PROT 5.2*  ALBUMIN 3.3*    CBG: Recent Labs  Lab 04/03/22 1947  GLUCAP 104*     Recent Results (from the past 240 hour(s))  Resp Panel by RT-PCR (Flu A&B, Covid) Anterior Nasal Swab     Status: Abnormal    Collection Time: 04/03/22  9:20 PM   Specimen: Anterior Nasal Swab  Result Value Ref Range Status   SARS Coronavirus 2 by RT PCR POSITIVE (A) NEGATIVE Final    Comment: (NOTE) SARS-CoV-2 target nucleic acids are DETECTED.  The SARS-CoV-2 RNA is generally detectable in upper respiratory specimens during the acute phase of infection. Positive results are indicative of the presence of the identified virus, but do not rule out bacterial infection or co-infection with other pathogens not detected by the test. Clinical correlation with patient history and other diagnostic information is necessary to determine patient infection status. The expected result is Negative.  Fact Sheet for Patients: EntrepreneurPulse.com.au  Fact Sheet for Healthcare Providers: IncredibleEmployment.be  This test is not yet approved or cleared by the [place] FDA and  has been authorized for detection and/or diagnosis of SARS-CoV-2 by FDA under an Emergency Use Authorization (EUA).  This EUA will remain in effect (meaning this test can be used) for the duration of  the COVID-19 declaration under Section 564(b)(1) of the A ct, 21 U.S.C. section 360bbb-3(b)(1), unless the authorization is terminated or revoked sooner.     Influenza A by PCR NEGATIVE NEGATIVE Final   Influenza B by PCR NEGATIVE NEGATIVE Final    Comment: (NOTE) The Xpert Xpress SARS-CoV-2/FLU/RSV plus assay is intended as an aid in the diagnosis of influenza from Nasopharyngeal swab specimens and should not be used as a sole basis for treatment. Nasal washings and aspirates are unacceptable for Xpert Xpress SARS-CoV-2/FLU/RSV testing.  Fact Sheet for Patients: EntrepreneurPulse.com.au  Fact Sheet for Healthcare Providers: IncredibleEmployment.be  This test is not yet approved or cleared by the [place] FDA and has been authorized for detection and/or  diagnosis of SARS-CoV-2 by FDA under an Emergency Use Authorization (EUA). This EUA will remain in effect (meaning this test can be used) for the duration of the COVID-19 declaration under Section 564(b)(1) of the Act, 21 U.S.C. section 360bbb-3(b)(1), unless the authorization is terminated or revoked.  Performed at [facility], [address], [place], [place] [address]          Radiology Studies: CT Head Wo Contrast  Result Date: 04/03/2022 CLINICAL DATA:  Unresponsive episode EXAM: CT HEAD WITHOUT CONTRAST CT CERVICAL SPINE WITHOUT CONTRAST TECHNIQUE: Multidetector CT imaging of the head and cervical spine was performed following the standard protocol without intravenous contrast. Multiplanar CT image reconstructions of the cervical spine were also generated. RADIATION DOSE REDUCTION: This exam was performed according to the departmental dose-optimization program which includes automated exposure control, adjustment of the mA and/or kV according to patient size and/or use of iterative reconstruction technique. COMPARISON:  MRI 11/18/2020, CT brain 10/04/2021 FINDINGS: CT HEAD FINDINGS Brain: No acute territorial infarction, hemorrhage or intracranial mass. The ventricles are nonenlarged Vascular: No hyperdense vessels.  No unexpected calcification Skull: Normal. Negative  for fracture or focal lesion. Sinuses/Orbits: No acute finding. Other: None CT CERVICAL SPINE FINDINGS Alignment: No subluxation.  Facet alignment within normal limits. Skull base and vertebrae: No acute fracture. No primary bone lesion or focal pathologic process. Soft tissues and spinal canal: No prevertebral fluid or swelling. No visible canal hematoma. Disc levels: Multilevel degenerative change. Mild to moderate diffuse disc space narrowing with multilevel bulky osteophyte. Multilevel bilateral foraminal narrowing. Upper chest: Negative. Other: None IMPRESSION: 1. Negative non contrasted CT appearance of the brain 2.  Degenerative changes of the cervical spine. No acute osseous abnormality Electronically Signed   By: Donavan Foil M.D.   On: 04/03/2022 21:06   CT Cervical Spine Wo Contrast  Result Date: 04/03/2022 CLINICAL DATA:  Unresponsive episode EXAM: CT HEAD WITHOUT CONTRAST CT CERVICAL SPINE WITHOUT CONTRAST TECHNIQUE: Multidetector CT imaging of the head and cervical spine was performed following the standard protocol without intravenous contrast. Multiplanar CT image reconstructions of the cervical spine were also generated. RADIATION DOSE REDUCTION: This exam was performed according to the departmental dose-optimization program which includes automated exposure control, adjustment of the mA and/or kV according to patient size and/or use of iterative reconstruction technique. COMPARISON:  MRI 11/18/2020, CT brain 10/04/2021 FINDINGS: CT HEAD FINDINGS Brain: No acute territorial infarction, hemorrhage or intracranial mass. The ventricles are nonenlarged Vascular: No hyperdense vessels.  No unexpected calcification Skull: Normal. Negative for fracture or focal lesion. Sinuses/Orbits: No acute finding. Other: None CT CERVICAL SPINE FINDINGS Alignment: No subluxation.  Facet alignment within normal limits. Skull base and vertebrae: No acute fracture. No primary bone lesion or focal pathologic process. Soft tissues and spinal canal: No prevertebral fluid or swelling. No visible canal hematoma. Disc levels: Multilevel degenerative change. Mild to moderate diffuse disc space narrowing with multilevel bulky osteophyte. Multilevel bilateral foraminal narrowing. Upper chest: Negative. Other: None IMPRESSION: 1. Negative non contrasted CT appearance of the brain 2. Degenerative changes of the cervical spine. No acute osseous abnormality Electronically Signed   By: Donavan Foil M.D.   On: 04/03/2022 21:06   DG Chest Port 1 View  Result Date: 04/03/2022 CLINICAL DATA:  Unresponsive episode EXAM: PORTABLE CHEST 1 VIEW  COMPARISON:  10/06/2018 FINDINGS: Low lung volumes. Heart and mediastinal contours are within normal limits. No focal opacities or effusions. No acute bony abnormality. IMPRESSION: Low volumes.  No active cardiopulmonary disease. Electronically Signed   By: Rolm Baptise M.D.   On: 04/03/2022 20:46        Scheduled Meds:  calcium carbonate  500 mg of elemental calcium Oral Q breakfast   enoxaparin (LOVENOX) injection  40 mg Subcutaneous Q24H   lamoTRIgine  400 mg Oral QHS   lamoTRIgine  200 mg Oral Daily   nirmatrelvir/ritonavir EUA  3 tablet Oral BID   pantoprazole  40 mg Oral Daily   potassium chloride  40 mEq Oral BID   sertraline  200 mg Oral Daily   Continuous Infusions:   LOS: 0 days    Time spent: 38 minutes.     Hosie Poisson, MD [facility]   To contact the attending provider between 7A-7P or the covering provider during after hours 7P-7A, please log into the web site [email or web address] and access using universal [place] password for that web site. If you do not have the password, please call the hospital operator.  04/04/2022, 9:37 AM

## 2022-04-04 NOTE — Evaluation (Signed)
Physical Therapy Evaluation Patient Details Name: Dale Bender MRN: [id number] DOB: 10-11-1970 Today's Date: 04/04/2022  History of Present Illness  Dale Bender is a 51 y.o. male with medical history significant of GERD, depression, seizures who presents to the emergency department via EMS after witnessed collapse.  Patient states that the last thing he remembered was that he was at church.  Most of the history was obtained from ED physician and ED medical record.  Per report, patient was reported to have collapsed to the ground at church.  Family member at the scene provided CPR, EMS was activated and on arrival of EMS team, patient was awake, but with altered mental status.  Rescue breaths via bagging was prior via EMS en route.  Patient was reported to be tachypneic, confused, febrile at 100.11F, but was without any breathing difficulty on arrival to the ED.   Clinical Impression  Patient demonstrates fair/good return for sitting up at bedside with labored movement, c/o generalized pain all over once seated and very unsteady on feet with near fall standing with RW.  Patient limited to a few side steps at bedside before having to sit due to c/o fatigue and generalized.  Patient required Min assist to move BLE when put back to bed.  Patient will benefit from continued skilled physical therapy in hospital and recommended venue below to increase strength, balance, endurance for safe ADLs and gait.        Recommendations for follow up therapy are one component of a multi-disciplinary discharge planning process, led by the attending physician.  Recommendations may be updated based on patient status, additional functional criteria and insurance authorization.  Follow Up Recommendations Skilled nursing-short term rehab (<3 hours/day) Can patient physically be transported by private vehicle: Yes    Assistance Recommended at Discharge Set up Supervision/Assistance  Patient can return home with the  following  A lot of help with walking and/or transfers;A little help with bathing/dressing/bathroom;Assistance with cooking/housework;Help with stairs or ramp for entrance    Equipment Recommendations Rolling walker (2 wheels)  Recommendations for Other Services       Functional Status Assessment Patient has had a recent decline in their functional status and demonstrates the ability to make significant improvements in function in a reasonable and predictable amount of time.     Precautions / Restrictions Precautions Precautions: Fall Restrictions Weight Bearing Restrictions: No      Mobility  Bed Mobility Overal bed mobility: Needs Assistance Bed Mobility: Supine to Sit, Sit to Supine     Supine to sit: Min guard Sit to supine: Min guard        Transfers Overall transfer level: Needs assistance Equipment used: Rolling walker (2 wheels) Transfers: Sit to/from Stand Sit to Stand: Mod assist           General transfer comment: slow labored shaky movement    Ambulation/Gait Ambulation/Gait assistance: Mod assist Gait Distance (Feet): 2 Feet Assistive device: Rolling walker (2 wheels) Gait Pattern/deviations: Decreased step length - right, Decreased step length - left, Decreased stance time - right, Decreased stance time - left, Decreased stride length Gait velocity: decreased     General Gait Details: limited to a couple of short side steps before having to sit due to c/o fatigue  Stairs            Wheelchair Mobility    Modified Rankin (Stroke Patients Only)       Balance Overall balance assessment: Needs assistance Sitting-balance support: Feet supported, Bilateral  upper extremity supported Sitting balance-Leahy Scale: Fair Sitting balance - Comments: seated at EOB   Standing balance support: During functional activity, Bilateral upper extremity supported Standing balance-Leahy Scale: Poor Standing balance comment: using RW                              Pertinent Vitals/Pain Pain Assessment Pain Assessment: 0-10 Pain Score: 8  Pain Location: generalized pain all over body, "per patient" Pain Descriptors / Indicators: Aching Pain Intervention(s): Limited activity within patient's tolerance, Monitored during session    Home Living Family/patient expects to be discharged to:: Private residence Living Arrangements: Spouse/significant other Available Help at Discharge: Family;Available PRN/intermittently Type of Home: House Home Access: Stairs to enter Entrance Stairs-Rails: None Entrance Stairs-Number of Steps: 2-3   Home Layout: One level Home Equipment: Cane - single point;Crutches      Prior Function Prior Level of Function : Independent/Modified Independent             Mobility Comments: Household and short distanced community ambulator using [facility] ADLs Comments: Indepedent, does not drive     Hand Dominance        Extremity/Trunk Assessment   Upper Extremity Assessment Upper Extremity Assessment: Generalized weakness    Lower Extremity Assessment Lower Extremity Assessment: Generalized weakness    Cervical / Trunk Assessment Cervical / Trunk Assessment: Normal  Communication   Communication: No difficulties  Cognition Arousal/Alertness: Awake/alert Behavior During Therapy: WFL for tasks assessed/performed Overall Cognitive Status: Within Functional Limits for tasks assessed                                          General Comments      Exercises     Assessment/Plan    PT Assessment Patient needs continued PT services  PT Problem List Decreased strength;Decreased activity tolerance;Decreased balance;Decreased mobility       PT Treatment Interventions DME instruction;Gait training;Stair training;Functional mobility training;Therapeutic activities;Therapeutic exercise;Balance training;Patient/family education    PT Goals (Current goals can be found in the Care  Plan section)  Acute Rehab PT Goals Patient Stated Goal: return home with family to assist PT Goal Formulation: With patient Time For Goal Achievement: 04/18/22 Potential to Achieve Goals: Good    Frequency Min 3X/week     Co-evaluation               AM-PAC PT "6 Clicks" Mobility  Outcome Measure Help needed turning from your back to your side while in a flat bed without using bedrails?: A Little Help needed moving from lying on your back to sitting on the side of a flat bed without using bedrails?: A Little Help needed moving to and from a bed to a chair (including a wheelchair)?: A Lot Help needed standing up from a chair using your arms (e.g., wheelchair or bedside chair)?: A Lot Help needed to walk in hospital room?: A Lot Help needed climbing 3-5 steps with a railing? : A Lot 6 Click Score: 14    End of Session   Activity Tolerance: Patient tolerated treatment well;Patient limited by fatigue Patient left: in bed;with call bell/phone within reach Nurse Communication: Mobility status PT Visit Diagnosis: Unsteadiness on feet (R26.81);Other abnormalities of gait and mobility (R26.89);Muscle weakness (generalized) (M62.81)    Time: 1607-3710 PT Time Calculation (min) (ACUTE ONLY): 25 min   Charges:  PT Evaluation $PT Eval Moderate Complexity: 1 Mod PT Treatments $Therapeutic Activity: 23-37 mins        12:19 PM, 04/04/22 Lonell Grandchild, MPT Physical Therapist with [facility] [phone] office [phone] mobile phone

## 2022-04-04 NOTE — Progress Notes (Signed)
*  PRELIMINARY RESULTS* Echocardiogram 2D Echocardiogram has been performed.  Dale Bender 04/04/2022, 9:31 AM

## 2022-04-04 NOTE — Plan of Care (Signed)
  Problem: Acute Rehab PT Goals(only PT should resolve) Goal: Pt Will Go Supine/Side To Sit Outcome: Progressing Flowsheets (Taken 04/04/2022 1221) Pt will go Supine/Side to Sit:  with supervision  with min guard assist Goal: Patient Will Transfer Sit To/From Stand Outcome: Progressing Flowsheets (Taken 04/04/2022 1221) Patient will transfer sit to/from stand:  with min guard assist  with minimal assist Goal: Pt Will Transfer Bed To Chair/Chair To Bed Outcome: Progressing Flowsheets (Taken 04/04/2022 1221) Pt will Transfer Bed to Chair/Chair to Bed:  with min assist  min guard assist Goal: Pt Will Ambulate Outcome: Progressing Flowsheets (Taken 04/04/2022 1221) Pt will Ambulate:  50 feet  with min guard assist  with minimal assist  with rolling walker   12:22 PM, 04/04/22 Lonell Grandchild, MPT Physical Therapist with [facility] [phone] office [phone] mobile phone

## 2022-04-04 NOTE — Progress Notes (Signed)
Temp 102.5.  Dr. Karleen Hampshire said ok to give tylenol again early

## 2022-04-05 DIAGNOSIS — Z833 Family history of diabetes mellitus: Secondary | ICD-10-CM | POA: Diagnosis not present

## 2022-04-05 DIAGNOSIS — F32A Depression, unspecified: Secondary | ICD-10-CM | POA: Diagnosis present

## 2022-04-05 DIAGNOSIS — D696 Thrombocytopenia, unspecified: Secondary | ICD-10-CM | POA: Diagnosis present

## 2022-04-05 DIAGNOSIS — E860 Dehydration: Secondary | ICD-10-CM | POA: Diagnosis present

## 2022-04-05 DIAGNOSIS — Z8249 Family history of ischemic heart disease and other diseases of the circulatory system: Secondary | ICD-10-CM | POA: Diagnosis not present

## 2022-04-05 DIAGNOSIS — I440 Atrioventricular block, first degree: Secondary | ICD-10-CM | POA: Diagnosis present

## 2022-04-05 DIAGNOSIS — G4730 Sleep apnea, unspecified: Secondary | ICD-10-CM | POA: Diagnosis present

## 2022-04-05 DIAGNOSIS — U071 COVID-19: Secondary | ICD-10-CM | POA: Diagnosis present

## 2022-04-05 DIAGNOSIS — K219 Gastro-esophageal reflux disease without esophagitis: Secondary | ICD-10-CM | POA: Diagnosis present

## 2022-04-05 DIAGNOSIS — Z87891 Personal history of nicotine dependence: Secondary | ICD-10-CM | POA: Diagnosis not present

## 2022-04-05 DIAGNOSIS — R55 Syncope and collapse: Secondary | ICD-10-CM | POA: Diagnosis present

## 2022-04-05 DIAGNOSIS — F419 Anxiety disorder, unspecified: Secondary | ICD-10-CM | POA: Diagnosis present

## 2022-04-05 DIAGNOSIS — R531 Weakness: Secondary | ICD-10-CM | POA: Diagnosis present

## 2022-04-05 DIAGNOSIS — E876 Hypokalemia: Secondary | ICD-10-CM | POA: Diagnosis present

## 2022-04-05 DIAGNOSIS — Z791 Long term (current) use of non-steroidal anti-inflammatories (NSAID): Secondary | ICD-10-CM | POA: Diagnosis not present

## 2022-04-05 DIAGNOSIS — Z79899 Other long term (current) drug therapy: Secondary | ICD-10-CM | POA: Diagnosis not present

## 2022-04-05 LAB — CBC WITH DIFFERENTIAL/PLATELET
Abs Immature Granulocytes: 0.01 10*3/uL (ref 0.00–0.07)
Basophils Absolute: 0 10*3/uL (ref 0.0–0.1)
Basophils Relative: 0 %
Eosinophils Absolute: 0.1 10*3/uL (ref 0.0–0.5)
Eosinophils Relative: 2 %
HCT: 42.5 % (ref 39.0–52.0)
Hemoglobin: 14.3 g/dL (ref 13.0–17.0)
Immature Granulocytes: 0 %
Lymphocytes Relative: 25 %
Lymphs Abs: 1.1 10*3/uL (ref 0.7–4.0)
MCH: 28.4 pg (ref 26.0–34.0)
MCHC: 33.6 g/dL (ref 30.0–36.0)
MCV: 84.5 fL (ref 80.0–100.0)
Monocytes Absolute: 0.6 10*3/uL (ref 0.1–1.0)
Monocytes Relative: 13 %
Neutro Abs: 2.7 10*3/uL (ref 1.7–7.7)
Neutrophils Relative %: 60 %
Platelets: 136 10*3/uL — ABNORMAL LOW (ref 150–400)
RBC: 5.03 MIL/uL (ref 4.22–5.81)
RDW: 12 % (ref 11.5–15.5)
WBC: 4.6 10*3/uL (ref 4.0–10.5)
nRBC: 0 % (ref 0.0–0.2)

## 2022-04-05 LAB — BASIC METABOLIC PANEL
Anion gap: 4 — ABNORMAL LOW (ref 5–15)
BUN: 14 mg/dL (ref 6–20)
CO2: 28 mmol/L (ref 22–32)
Calcium: 9.1 mg/dL (ref 8.9–10.3)
Chloride: 105 mmol/L (ref 98–111)
Creatinine, Ser: 1.35 mg/dL — ABNORMAL HIGH (ref 0.61–1.24)
GFR, Estimated: >60 mL/min (ref >60)
Glucose, Bld: 87 mg/dL (ref 70–99)
Potassium: 4.7 mmol/L (ref 3.5–5.1)
Sodium: 137 mmol/L (ref 135–145)

## 2022-04-05 LAB — GLUCOSE, CAPILLARY: Glucose-Capillary: 128 mg/dL — ABNORMAL HIGH (ref 70–99)

## 2022-04-05 LAB — C-REACTIVE PROTEIN: CRP: 6.2 mg/dL — ABNORMAL HIGH (ref <1.0)

## 2022-04-05 LAB — LAMOTRIGINE LEVEL: Lamotrigine Lvl: 8.8 ug/mL (ref 2.0–20.0)

## 2022-04-05 MED ORDER — TRAMADOL HCL 50 MG PO TABS
50.0000 mg | ORAL_TABLET | Freq: Four times a day (QID) | ORAL | Status: DC | PRN
  Administered 2022-04-05 – 2022-04-08 (×3): 50 mg via ORAL
  Filled 2022-04-05 (×3): qty 1

## 2022-04-05 MED ORDER — ACETAMINOPHEN 325 MG PO TABS: 650.0000 mg | ORAL_TABLET | ORAL | Status: DC | PRN

## 2022-04-05 MED ORDER — ACETAMINOPHEN 650 MG RE SUPP: 650.0000 mg | Freq: Four times a day (QID) | RECTAL | Status: DC | PRN

## 2022-04-05 NOTE — Progress Notes (Signed)
Physical Therapy Treatment Patient Details Name: Dale Bender MRN: [id number] DOB: 1970-11-30 Today's Date: 04/05/2022   History of Present Illness Dale Bender is a 51 y.o. male with medical history significant of GERD, depression, seizures who presents to the emergency department via EMS after witnessed collapse.  Patient states that the last thing he remembered was that he was at church.  Most of the history was obtained from ED physician and ED medical record.  Per report, patient was reported to have collapsed to the ground at church.  Family member at the scene provided CPR, EMS was activated and on arrival of EMS team, patient was awake, but with altered mental status.  Rescue breaths via bagging was prior via EMS en route.  Patient was reported to be tachypneic, confused, febrile at 100.72F, but was without any breathing difficulty on arrival to the ED.    PT Comments    Patient demonstrates slight improvement for sitting up at bedside with less labored movement, very unsteady and shaky on feet and limited to a few steps forward/backwards before having to sit due to BLE weakness and trembling of extremities. Patient able to transfer to chair with Mod assist and tolerated sitting up after therapy - nurse notified.  Patient will benefit from continued skilled physical therapy in hospital and recommended venue below to increase strength, balance, endurance for safe ADLs and gait.     Recommendations for follow up therapy are one component of a multi-disciplinary discharge planning process, led by the attending physician.  Recommendations may be updated based on patient status, additional functional criteria and insurance authorization.  Follow Up Recommendations  Skilled nursing-short term rehab (<3 hours/day) Can patient physically be transported by private vehicle: Yes   Assistance Recommended at Discharge Set up Supervision/Assistance  Patient can return home with the following A  lot of help with walking and/or transfers;A little help with bathing/dressing/bathroom;Assistance with cooking/housework;Help with stairs or ramp for entrance   Equipment Recommendations  Rolling walker (2 wheels)    Recommendations for Other Services       Precautions / Restrictions Precautions Precautions: Fall Restrictions Weight Bearing Restrictions: No     Mobility  Bed Mobility Overal bed mobility: Needs Assistance Bed Mobility: Supine to Sit     Supine to sit: Supervision Sit to supine: Supervision   General bed mobility comments: demonstrates increased strength for sitting up at bedside    Transfers Overall transfer level: Needs assistance Equipment used: Rolling walker (2 wheels) Transfers: Sit to/from Stand Sit to Stand: Min assist, Mod assist           General transfer comment: slow labored shaky movement    Ambulation/Gait Ambulation/Gait assistance: Mod assist Gait Distance (Feet): 4 Feet Assistive device: Rolling walker (2 wheels) Gait Pattern/deviations: Decreased step length - right, Decreased step length - left, Decreased stance time - right, Decreased stance time - left, Decreased stride length Gait velocity: decreased     General Gait Details: limited to a few steps forward/backwards before having to sit due to BLE weakness and trembly movement   Stairs             Wheelchair Mobility    Modified Rankin (Stroke Patients Only)       Balance Overall balance assessment: Needs assistance Sitting-balance support: Feet supported, Bilateral upper extremity supported Sitting balance-Leahy Scale: Good Sitting balance - Comments: seated at EOB   Standing balance support: During functional activity, Bilateral upper extremity supported Standing balance-Leahy Scale: Poor Standing balance  comment: using RW                            Cognition Arousal/Alertness: Awake/alert Behavior During Therapy: WFL for tasks  assessed/performed Overall Cognitive Status: Within Functional Limits for tasks assessed                                          Exercises General Exercises - Lower Extremity Long Arc Quad: Seated, AROM, Strengthening, Both, 5 reps Hip Flexion/Marching: Seated, AROM, Strengthening, Both, 5 reps Toe Raises: Seated, AROM, Strengthening, Both, 10 reps Heel Raises: Seated, AROM, Strengthening, Both, 10 reps    General Comments        Pertinent Vitals/Pain Pain Assessment Pain Assessment: Faces Faces Pain Scale: Hurts a little bit Pain Location: generalized pain all over body Pain Descriptors / Indicators: Sore Pain Intervention(s): Limited activity within patient's tolerance, Monitored during session    Home Living                          Prior Function            PT Goals (current goals can now be found in the care plan section) Acute Rehab PT Goals Patient Stated Goal: return home with family to assist PT Goal Formulation: With patient Time For Goal Achievement: 04/18/22 Potential to Achieve Goals: Good Progress towards PT goals: Progressing toward goals    Frequency    Min 3X/week      PT Plan Current plan remains appropriate    Co-evaluation              AM-PAC PT "6 Clicks" Mobility   Outcome Measure  Help needed turning from your back to your side while in a flat bed without using bedrails?: None Help needed moving from lying on your back to sitting on the side of a flat bed without using bedrails?: A Little Help needed moving to and from a bed to a chair (including a wheelchair)?: A Lot Help needed standing up from a chair using your arms (e.g., wheelchair or bedside chair)?: A Lot Help needed to walk in hospital room?: A Lot Help needed climbing 3-5 steps with a railing? : Total 6 Click Score: 14    End of Session   Activity Tolerance: Patient tolerated treatment well;Patient limited by fatigue Patient left: in  chair;with call bell/phone within reach Nurse Communication: Mobility status PT Visit Diagnosis: Unsteadiness on feet (R26.81);Other abnormalities of gait and mobility (R26.89);Muscle weakness (generalized) (M62.81)     Time: 9179-1505 PT Time Calculation (min) (ACUTE ONLY): 31 min  Charges:  $Therapeutic Exercise: 8-22 mins $Therapeutic Activity: 8-22 mins                     2:16 PM, 04/05/22 Lonell Grandchild, MPT Physical Therapist with [facility] [phone] office [phone] mobile phone

## 2022-04-05 NOTE — Care Management Obs Status (Signed)
[place] NOTIFICATION   Patient Details  Name: Dale Bender MRN: [id number] Date of Birth: [birth year]   Medicare Observation Status Notification Given:  Yes (copy mailed to address on file)    Tommy Medal 04/05/2022, 9:45 AM

## 2022-04-05 NOTE — TOC Initial Note (Signed)
Transition of Care [facility]) - Initial/Assessment Note    Patient Details  Name: Dale Bender MRN: [id number] Date of Birth: 30-Mar-1971  [facility] CM/SW Contact:    Shade Flood, LCSW Phone Number: 04/05/2022, 11:14 AM  Clinical Narrative:                  Pt admitted from home. PT recommending SNF rehab. TOC completed chart review to assess dc planning needs.   Per MD, pt has weakness of L side at baseline and he is weaker at this time due to Covid infection. MD anticipating pt may be able to dc home with [facility] once his Covid symptoms improve.  If pt does require SNF at dc, he would not be able to admit to a SNF until 11 days out from covid diagnosis.  TOC will follow and further assess pt once his symptoms improve.   Expected Discharge Plan: [place] Barriers to Discharge: Continued Medical Work up   Patient Goals and CMS Choice Patient states their goals for this hospitalization and ongoing recovery are:: get better      Expected Discharge Plan and Services Expected Discharge Plan: [place] In-house Referral: Clinical Social Work     Living arrangements for the past 2 months: Single Family Home                                      Prior Living Arrangements/Services Living arrangements for the past 2 months: Single Family Home Lives with:: Spouse Patient language and need for interpreter reviewed:: Yes Do you feel safe going back to the place where you live?: Yes      Need for Family Participation in Patient Care: No (Comment)     Criminal Activity/Legal Involvement Pertinent to Current Situation/Hospitalization: No - Comment as needed  Activities of Daily Living Home Assistive Devices/Equipment: Eyeglasses ADL Screening (condition at time of admission) Patient's cognitive ability adequate to safely complete daily activities?: No Is the patient deaf or have difficulty hearing?: No Does the patient  have difficulty seeing, even when wearing glasses/contacts?: Yes Does the patient have difficulty concentrating, remembering, or making decisions?: No Patient able to express need for assistance with ADLs?: Yes Does the patient have difficulty dressing or bathing?: No Independently performs ADLs?: Yes (appropriate for developmental age) Does the patient have difficulty walking or climbing stairs?: Yes Weakness of Legs: Right Weakness of Arms/Hands: None  Permission Sought/Granted                  Emotional Assessment       Orientation: : Oriented to Self, Oriented to Place, Oriented to  Time, Oriented to Situation Alcohol / Substance Use: Not Applicable Psych Involvement: No (comment)  Admission diagnosis:  Respiratory distress [R06.03] LOC (loss of consciousness) ([place]) [R40.20] COVID [U07.1] COVID-19 virus infection [U07.1] Patient Active Problem List   Diagnosis Date Noted   Syncope and collapse 04/04/2022   Hypokalemia 04/04/2022   Thrombocytopenia ([place]) 04/04/2022   Hypocalcemia 04/04/2022   COVID-19 virus infection 04/03/2022   Weakness 11/20/2020   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/02/2020   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   Seizures ([place]) 67/61/9509   Umbilical hernia without obstruction and without gangrene    Convulsion ([place]) 10/04/2019   OSA (obstructive sleep apnea) 09/16/2019   Seizure disorder ([place]) 09/16/2019   Major depressive disorder with single  episode, in full remission ([place]) 08/30/2019   Seizure ([place]) 08/05/2019   COVID-19 virus detected 08/05/2019   Acute respiratory distress 08/05/2019   AKI (acute kidney injury) ([place]) 08/05/2019   Dehydration 08/05/2019   Epilepsy ([place]) 06/10/2019   Right leg numbness 06/10/2019   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 [place] [address] Phone: [phone] Fax: [phone]     Social Determinants of Health (SDOH) Interventions    Readmission Risk Interventions     No data to display

## 2022-04-05 NOTE — Progress Notes (Signed)
PROGRESS NOTE    Dale Bender  IRJ:[id number] DOB: Mar 01, 1971 DOA: 04/03/2022 PCP: Denny Levy, PA    Chief Complaint  Patient presents with   Altered Mental Status    Brief Narrative:  Dale Bender is a 51 y.o. male with medical history significant of GERD, depression, seizures who presents to the emergency department via EMS after witnessed collapse.  Patient states that the last thing he remembered was that he was at church.   Patient was reported to be tachypneic, confused, febrile at 100.47F, but was without any breathing difficulty on arrival to the ED.  SARS coronavirus 2 was positive. CT head without contrast and CT cervical spine without contrast showed negative noncontrasted CT appearance of the brain and degenerative changes of the cervical spine.  No acute osseous abnormality. Chest x-ray showed low volumes.  No active cardiopulmonary disease  PT had fever overnight. Still very sore all over.   Assessment & Plan:   Principal Problem:   Syncope and collapse Active Problems:   GERD (gastroesophageal reflux disease)   Seizure (HCC)   COVID-19 virus infection   Hypokalemia   Thrombocytopenia (HCC)   Hypocalcemia   Syncope   Syncope and collapse Unclear etiology, suspect from dehydration from COVID 19 Infection.  Monitor on telemetry to evaluate for arrhythmias.  Echo is unremarkable.  Negative orthostatics. Carotid duplex is negative.  He is alert and oriented and appears to be back to baseline mental status.  EKG shows NSR and prolonged PR interval.     COVID 19 INFECTION.  Currently on Paxlovid.  Follow inflammatory markers.    Right sided lower extremity weakness from 3 years.  Worse with myalgias this admission.  Pain control and therapy eval recommending snf    Hypokalemia replaced.    Mild thrombocytopenia:  Monitor. Recheck in am.    GERD: Stable. Continue with PPI.    Depression  Continue with zoloft.    DVT prophylaxis:  lovenox.  Code Status: full code.  Family Communication: none at bedside.  Disposition:   Status is: Observation The patient will require care spanning > 2 midnights and should be moved to inpatient because: syncope work up and [facility].    Level of care: Telemetry Consultants:  NONE.   Procedures: NONE.   Antimicrobials: Paxlovid.    Subjective: Much better than  yesterday.  Fever of 102 yesterday night.   Objective: Vitals:   04/04/22 1815 04/04/22 2200 04/05/22 1000 04/05/22 1303  BP:  125/66  118/79  Pulse:  70  71  Resp:    18  Temp: (!) 102.5 F (39.2 C) 98.4 F (36.9 C)    TempSrc: Oral Oral    SpO2:  100%  100%  Height:   '6\' 1"'$  (1.854 m)     Intake/Output Summary (Last 24 hours) at 04/05/2022 1705 Last data filed at 04/05/2022 1200 Gross per 24 hour  Intake 480 ml  Output 800 ml  Net -320 ml   There were no vitals filed for this visit.  Examination:  General exam: Appears calm and comfortable  Respiratory system: Clear to auscultation. Respiratory effort normal. Cardiovascular system: S1 & S2 heard, RRR. No JVD,  No pedal edema. Gastrointestinal system: Abdomen is nondistended, soft and nontender. Normal bowel sounds heard. Central nervous system: Alert and oriented. No focal neurological deficits. Extremities: Symmetric 5 x 5 power. Skin: No rashes, lesions or ulcers Psychiatry: Judgement and insight appear normal. Mood & affect appropriate.      Data  Reviewed: I have personally reviewed following labs and imaging studies  CBC: Recent Labs  Lab 04/03/22 1948 04/05/22 0450  WBC 4.5 4.6  NEUTROABS 3.2 2.7  HGB 13.1 14.3  HCT 38.2* 42.5  MCV 86.2 84.5  PLT 140* 136*     Basic Metabolic Panel: Recent Labs  Lab 04/03/22 1948 04/04/22 1046 04/05/22 0450  NA 141 139 137  K 3.1* 4.4 4.7  CL 116* 107 105  CO2 '22 27 28  '$ GLUCOSE 90 104* 87  BUN '12 13 14  '$ CREATININE 1.09 1.31* 1.35*  CALCIUM 6.7* 8.6* 9.1     GFR: CrCl  cannot be calculated (Unknown ideal weight.).  Liver Function Tests: Recent Labs  Lab 04/03/22 1948  AST 17  ALT 17  ALKPHOS 69  BILITOT 0.7  PROT 5.2*  ALBUMIN 3.3*     CBG: Recent Labs  Lab 04/03/22 1947  GLUCAP 104*      Recent Results (from the past 240 hour(s))  Resp Panel by RT-PCR (Flu A&B, Covid) Anterior Nasal Swab     Status: Abnormal   Collection Time: 04/03/22  9:20 PM   Specimen: Anterior Nasal Swab  Result Value Ref Range Status   SARS Coronavirus 2 by RT PCR POSITIVE (A) NEGATIVE Final    Comment: (NOTE) SARS-CoV-2 target nucleic acids are DETECTED.  The SARS-CoV-2 RNA is generally detectable in upper respiratory specimens during the acute phase of infection. Positive results are indicative of the presence of the identified virus, but do not rule out bacterial infection or co-infection with other pathogens not detected by the test. Clinical correlation with patient history and other diagnostic information is necessary to determine patient infection status. The expected result is Negative.  Fact Sheet for Patients: EntrepreneurPulse.com.au  Fact Sheet for Healthcare Providers: IncredibleEmployment.be  This test is not yet approved or cleared by the [place] FDA and  has been authorized for detection and/or diagnosis of SARS-CoV-2 by FDA under an Emergency Use Authorization (EUA).  This EUA will remain in effect (meaning this test can be used) for the duration of  the COVID-19 declaration under Section 564(b)(1) of the A ct, 21 U.S.C. section 360bbb-3(b)(1), unless the authorization is terminated or revoked sooner.     Influenza A by PCR NEGATIVE NEGATIVE Final   Influenza B by PCR NEGATIVE NEGATIVE Final    Comment: (NOTE) The Xpert Xpress SARS-CoV-2/FLU/RSV plus assay is intended as an aid in the diagnosis of influenza from Nasopharyngeal swab specimens and should not be used as a sole basis for  treatment. Nasal washings and aspirates are unacceptable for Xpert Xpress SARS-CoV-2/FLU/RSV testing.  Fact Sheet for Patients: EntrepreneurPulse.com.au  Fact Sheet for Healthcare Providers: IncredibleEmployment.be  This test is not yet approved or cleared by the [place] FDA and has been authorized for detection and/or diagnosis of SARS-CoV-2 by FDA under an Emergency Use Authorization (EUA). This EUA will remain in effect (meaning this test can be used) for the duration of the COVID-19 declaration under Section 564(b)(1) of the Act, 21 U.S.C. section 360bbb-3(b)(1), unless the authorization is terminated or revoked.  Performed at [facility], [address] [place], [place] [address]          Radiology Studies: ECHOCARDIOGRAM COMPLETE  Result Date: 04/04/2022    ECHOCARDIOGRAM REPORT   Patient Name:   SKYE RODARTE Date of Exam: 04/04/2022 Medical Rec #:  [id number]        Height:       73.0 in Accession #:  [id number]       Weight:       262.0 lb Date of Birth:  16-Feb-1971        BSA:          2.413 m Patient Age:    81 years         BP:           128/83 mmHg Patient Gender: M                HR:           82 bpm. Exam Location:  [facility] Procedure: 2D Echo, Cardiac Doppler and Color Doppler Indications:    Syncope  History:        Patient has prior history of Echocardiogram examinations, most                 recent 07/28/2019. Signs/Symptoms:Syncope; Risk Factors:Former                 Smoker. Seizure disorder, COVID +.  Sonographer:    Wenda Low Referring Phys: [id number] OLADAPO ADEFESO IMPRESSIONS  1. Left ventricular ejection fraction, by estimation, is 60 to 65%. The left ventricle has normal function. The left ventricle has no regional wall motion abnormalities. There is moderate left ventricular hypertrophy. Left ventricular diastolic parameters were normal.  2. Right ventricular systolic function is normal. The right  ventricular size is normal. There is normal pulmonary artery systolic pressure.  3. The mitral valve is normal in structure. No evidence of mitral valve regurgitation. No evidence of mitral stenosis.  4. The aortic valve is tricuspid. Aortic valve regurgitation is not visualized. No aortic stenosis is present.  5. The inferior vena cava is normal in size with greater than 50% respiratory variability, suggesting right atrial pressure of 3 mmHg. FINDINGS  Left Ventricle: Left ventricular ejection fraction, by estimation, is 60 to 65%. The left ventricle has normal function. The left ventricle has no regional wall motion abnormalities. The left ventricular internal cavity size was normal in size. There is  moderate left ventricular hypertrophy. Left ventricular diastolic parameters were normal. Right Ventricle: The right ventricular size is normal. Right vetricular wall thickness was not well visualized. Right ventricular systolic function is normal. There is normal pulmonary artery systolic pressure. The tricuspid regurgitant velocity is 1.98 m/s, and with an assumed right atrial pressure of 8 mmHg, the estimated right ventricular systolic pressure is 89.2 mmHg. Left Atrium: Left atrial size was normal in size. Right Atrium: Right atrial size was normal in size. Pericardium: There is no evidence of pericardial effusion. Mitral Valve: The mitral valve is normal in structure. No evidence of mitral valve regurgitation. No evidence of mitral valve stenosis. MV peak gradient, 4.2 mmHg. The mean mitral valve gradient is 2.0 mmHg. Tricuspid Valve: The tricuspid valve is normal in structure. Tricuspid valve regurgitation is not demonstrated. No evidence of tricuspid stenosis. Aortic Valve: The aortic valve is tricuspid. Aortic valve regurgitation is not visualized. No aortic stenosis is present. Aortic valve mean gradient measures 4.0 mmHg. Aortic valve peak gradient measures 7.8 mmHg. Aortic valve area, by VTI measures 2.75  cm. Pulmonic Valve: The pulmonic valve was not well visualized. Pulmonic valve regurgitation is not visualized. No evidence of pulmonic stenosis. Aorta: The aortic root is normal in size and structure. Venous: The inferior vena cava is normal in size with greater than 50% respiratory variability, suggesting right atrial pressure of 3 mmHg. IAS/Shunts: No atrial level shunt detected by color flow  Doppler.  LEFT VENTRICLE PLAX 2D LVIDd:         5.00 cm     Diastology LVIDs:         3.20 cm     LV e' medial:    9.36 cm/s LV PW:         1.30 cm     LV E/e' medial:  9.9 LV IVS:        1.30 cm     LV e' lateral:   11.50 cm/s LVOT diam:     2.10 cm     LV E/e' lateral: 8.1 LV SV:         74 LV SV Index:   31 LVOT Area:     3.46 cm  LV Volumes (MOD) LV vol d, MOD A2C: 90.2 ml LV vol d, MOD A4C: 94.9 ml LV vol s, MOD A2C: 35.8 ml LV vol s, MOD A4C: 43.0 ml LV SV MOD A2C:     54.4 ml LV SV MOD A4C:     94.9 ml LV SV MOD BP:      57.6 ml RIGHT VENTRICLE RV Basal diam:  3.85 cm RV Mid diam:    3.50 cm RV S prime:     14.90 cm/s TAPSE (M-mode): 2.8 cm LEFT ATRIUM             Index        RIGHT ATRIUM           Index LA diam:        4.20 cm 1.74 cm/m   RA Area:     18.10 cm LA Vol (A2C):   64.4 ml 26.69 ml/m  RA Volume:   52.00 ml  21.55 ml/m LA Vol (A4C):   57.6 ml 23.87 ml/m LA Biplane Vol: 65.1 ml 26.98 ml/m  AORTIC VALVE                    PULMONIC VALVE AV Area (Vmax):    2.97 cm     PV Vmax:       0.60 m/s AV Area (Vmean):   2.59 cm     PV Peak grad:  1.5 mmHg AV Area (VTI):     2.75 cm AV Vmax:           140.00 cm/s AV Vmean:          94.300 cm/s AV VTI:            0.271 m AV Peak Grad:      7.8 mmHg AV Mean Grad:      4.0 mmHg LVOT Vmax:         120.00 cm/s LVOT Vmean:        70.400 cm/s LVOT VTI:          0.215 m LVOT/AV VTI ratio: 0.79  AORTA Ao Root diam: 3.30 cm Ao Asc diam:  2.70 cm MITRAL VALVE               TRICUSPID VALVE MV Area (PHT): 3.87 cm    TR Peak grad:   15.7 mmHg MV Area VTI:   3.59 cm    TR  Vmax:        198.00 cm/s MV Peak grad:  4.2 mmHg MV Mean grad:  2.0 mmHg    SHUNTS MV Vmax:       1.02 m/s    Systemic VTI:  0.22 m MV Vmean:      64.4 cm/s  Systemic Diam: 2.10 cm MV Decel Time: 196 msec MV E velocity: 93.00 cm/s MV A velocity: 60.80 cm/s MV E/A ratio:  1.53 Carlyle Dolly MD Electronically signed by Carlyle Dolly MD Signature Date/Time: 04/04/2022/11:03:07 AM    Final    US Carotid Bilateral  Result Date: 04/04/2022 CLINICAL DATA:  Syncope Collapse Former tobacco use EXAM: BILATERAL CAROTID DUPLEX ULTRASOUND TECHNIQUE: Pearline Cables scale imaging, color Doppler and duplex ultrasound were performed of bilateral carotid and vertebral arteries in the neck. COMPARISON:  None available FINDINGS: Criteria: Quantification of carotid stenosis is based on velocity parameters that correlate the residual internal carotid diameter with NASCET-based stenosis levels, using the diameter of the distal internal carotid lumen as the denominator for stenosis measurement. The following velocity measurements were obtained: RIGHT ICA: 70/30 cm/sec CCA: 423/53 cm/sec SYSTOLIC ICA/CCA RATIO:  0.6 ECA: 98 cm/sec LEFT ICA: 117/22 cm/sec CCA: 614/43 cm/sec SYSTOLIC ICA/CCA RATIO:  1.9 ECA: 128 cm/sec RIGHT CAROTID ARTERY: No significant atheromatous plaque. RIGHT VERTEBRAL ARTERY:  Antegrade flow. LEFT CAROTID ARTERY:  No significant atheromatous plaque. LEFT VERTEBRAL ARTERY:  Antegrade flow. IMPRESSION: No significant stenosis of internal carotid arteries. Electronically Signed   By: Miachel Roux M.D.   On: 04/04/2022 10:57   CT Head Wo Contrast  Result Date: 04/03/2022 CLINICAL DATA:  Unresponsive episode EXAM: CT HEAD WITHOUT CONTRAST CT CERVICAL SPINE WITHOUT CONTRAST TECHNIQUE: Multidetector CT imaging of the head and cervical spine was performed following the standard protocol without intravenous contrast. Multiplanar CT image reconstructions of the cervical spine were also generated. RADIATION DOSE REDUCTION: This  exam was performed according to the departmental dose-optimization program which includes automated exposure control, adjustment of the mA and/or kV according to patient size and/or use of iterative reconstruction technique. COMPARISON:  MRI 11/18/2020, CT brain 10/04/2021 FINDINGS: CT HEAD FINDINGS Brain: No acute territorial infarction, hemorrhage or intracranial mass. The ventricles are nonenlarged Vascular: No hyperdense vessels.  No unexpected calcification Skull: Normal. Negative for fracture or focal lesion. Sinuses/Orbits: No acute finding. Other: None CT CERVICAL SPINE FINDINGS Alignment: No subluxation.  Facet alignment within normal limits. Skull base and vertebrae: No acute fracture. No primary bone lesion or focal pathologic process. Soft tissues and spinal canal: No prevertebral fluid or swelling. No visible canal hematoma. Disc levels: Multilevel degenerative change. Mild to moderate diffuse disc space narrowing with multilevel bulky osteophyte. Multilevel bilateral foraminal narrowing. Upper chest: Negative. Other: None IMPRESSION: 1. Negative non contrasted CT appearance of the brain 2. Degenerative changes of the cervical spine. No acute osseous abnormality Electronically Signed   By: Donavan Foil M.D.   On: 04/03/2022 21:06   CT Cervical Spine Wo Contrast  Result Date: 04/03/2022 CLINICAL DATA:  Unresponsive episode EXAM: CT HEAD WITHOUT CONTRAST CT CERVICAL SPINE WITHOUT CONTRAST TECHNIQUE: Multidetector CT imaging of the head and cervical spine was performed following the standard protocol without intravenous contrast. Multiplanar CT image reconstructions of the cervical spine were also generated. RADIATION DOSE REDUCTION: This exam was performed according to the departmental dose-optimization program which includes automated exposure control, adjustment of the mA and/or kV according to patient size and/or use of iterative reconstruction technique. COMPARISON:  MRI 11/18/2020, CT brain  10/04/2021 FINDINGS: CT HEAD FINDINGS Brain: No acute territorial infarction, hemorrhage or intracranial mass. The ventricles are nonenlarged Vascular: No hyperdense vessels.  No unexpected calcification Skull: Normal. Negative for fracture or focal lesion. Sinuses/Orbits: No acute finding. Other: None CT CERVICAL SPINE FINDINGS Alignment: No subluxation.  Facet alignment within normal limits. Skull base  and vertebrae: No acute fracture. No primary bone lesion or focal pathologic process. Soft tissues and spinal canal: No prevertebral fluid or swelling. No visible canal hematoma. Disc levels: Multilevel degenerative change. Mild to moderate diffuse disc space narrowing with multilevel bulky osteophyte. Multilevel bilateral foraminal narrowing. Upper chest: Negative. Other: None IMPRESSION: 1. Negative non contrasted CT appearance of the brain 2. Degenerative changes of the cervical spine. No acute osseous abnormality Electronically Signed   By: Donavan Foil M.D.   On: 04/03/2022 21:06   DG Chest Port 1 View  Result Date: 04/03/2022 CLINICAL DATA:  Unresponsive episode EXAM: PORTABLE CHEST 1 VIEW COMPARISON:  10/06/2018 FINDINGS: Low lung volumes. Heart and mediastinal contours are within normal limits. No focal opacities or effusions. No acute bony abnormality. IMPRESSION: Low volumes.  No active cardiopulmonary disease. Electronically Signed   By: Rolm Baptise M.D.   On: 04/03/2022 20:46        Scheduled Meds:  calcium carbonate  500 mg of elemental calcium Oral Q breakfast   enoxaparin (LOVENOX) injection  40 mg Subcutaneous Q24H   lamoTRIgine  400 mg Oral QHS   nirmatrelvir/ritonavir EUA  3 tablet Oral BID   pantoprazole  40 mg Oral Daily   sertraline  200 mg Oral Daily   Continuous Infusions:   LOS: 0 days    Time spent: 36 minutes.     Hosie Poisson, MD [facility]   To contact the attending provider between 7A-7P or the covering provider during after hours 7P-7A, please  log into the web site [email or web address] and access using universal [place] password for that web site. If you do not have the password, please call the hospital operator.  04/05/2022, 5:05 PM

## 2022-04-06 DIAGNOSIS — U071 COVID-19: Secondary | ICD-10-CM | POA: Diagnosis not present

## 2022-04-06 DIAGNOSIS — R55 Syncope and collapse: Secondary | ICD-10-CM | POA: Diagnosis not present

## 2022-04-06 DIAGNOSIS — K219 Gastro-esophageal reflux disease without esophagitis: Secondary | ICD-10-CM | POA: Diagnosis not present

## 2022-04-06 LAB — BASIC METABOLIC PANEL
Anion gap: 8 (ref 5–15)
BUN: 16 mg/dL (ref 6–20)
CO2: 29 mmol/L (ref 22–32)
Calcium: 9 mg/dL (ref 8.9–10.3)
Chloride: 104 mmol/L (ref 98–111)
Creatinine, Ser: 1.49 mg/dL — ABNORMAL HIGH (ref 0.61–1.24)
GFR, Estimated: 56 mL/min — ABNORMAL LOW (ref >60)
Glucose, Bld: 104 mg/dL — ABNORMAL HIGH (ref 70–99)
Potassium: 4.4 mmol/L (ref 3.5–5.1)
Sodium: 141 mmol/L (ref 135–145)

## 2022-04-06 LAB — FERRITIN: Ferritin: 144 ng/mL (ref 24–336)

## 2022-04-06 LAB — CBC WITH DIFFERENTIAL/PLATELET
Abs Immature Granulocytes: 0.01 10*3/uL (ref 0.00–0.07)
Basophils Absolute: 0 10*3/uL (ref 0.0–0.1)
Basophils Relative: 0 %
Eosinophils Absolute: 0.1 10*3/uL (ref 0.0–0.5)
Eosinophils Relative: 2 %
HCT: 43.9 % (ref 39.0–52.0)
Hemoglobin: 15 g/dL (ref 13.0–17.0)
Immature Granulocytes: 0 %
Lymphocytes Relative: 31 %
Lymphs Abs: 1.4 10*3/uL (ref 0.7–4.0)
MCH: 29.1 pg (ref 26.0–34.0)
MCHC: 34.2 g/dL (ref 30.0–36.0)
MCV: 85.1 fL (ref 80.0–100.0)
Monocytes Absolute: 0.5 10*3/uL (ref 0.1–1.0)
Monocytes Relative: 10 %
Neutro Abs: 2.5 10*3/uL (ref 1.7–7.7)
Neutrophils Relative %: 57 %
Platelets: 142 10*3/uL — ABNORMAL LOW (ref 150–400)
RBC: 5.16 MIL/uL (ref 4.22–5.81)
RDW: 12.1 % (ref 11.5–15.5)
WBC: 4.4 10*3/uL (ref 4.0–10.5)
nRBC: 0 % (ref 0.0–0.2)

## 2022-04-06 LAB — C-REACTIVE PROTEIN: CRP: 4.1 mg/dL — ABNORMAL HIGH (ref <1.0)

## 2022-04-06 LAB — MAGNESIUM: Magnesium: 2.3 mg/dL (ref 1.7–2.4)

## 2022-04-06 NOTE — Progress Notes (Signed)
No new issues this shift.

## 2022-04-06 NOTE — NC FL2 (Signed)
[place] LEVEL OF CARE SCREENING TOOL     IDENTIFICATION  Patient Name: Dale Bender Birthdate: 03-28-1971 Sex: male Admission Date (Current Location): 04/03/2022  [facility] and [place] Number:  [employer] and Address:  [place] [address] [place]      Provider Number: [id number]  Attending Physician Name and Address:  Hosie Poisson, MD  Relative Name and Phone Number:       Current Level of Care: Hospital Recommended Level of Care: [place] Prior Approval Number:    Date Approved/Denied:   PASRR Number:    Discharge Plan: SNF    Current Diagnoses: Patient Active Problem List   Diagnosis Date Noted   Syncope 04/05/2022   Syncope and collapse 04/04/2022   Hypokalemia 04/04/2022   Thrombocytopenia ([place]) 04/04/2022   Hypocalcemia 04/04/2022   COVID-19 virus infection 04/03/2022   Weakness 11/20/2020   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/02/2020   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   Seizures ([place]) 78/93/8101   Umbilical hernia without obstruction and without gangrene    Convulsion ([place]) 10/04/2019   OSA (obstructive sleep apnea) 09/16/2019   Seizure disorder ([place]) 09/16/2019   Major depressive disorder with single episode, in full remission ([place]) 08/30/2019   Seizure ([place]) 08/05/2019   COVID-19 virus detected 08/05/2019   Acute respiratory distress 08/05/2019   AKI (acute kidney injury) ([place]) 08/05/2019   Dehydration 08/05/2019   Epilepsy ([place]) 06/10/2019   Right leg numbness 06/10/2019   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[place]) 04/13/2019   Seizure-like activity ([place]) 07/22/2018   Right leg weakness 07/22/2018   Gait abnormality 07/22/2018   GERD (gastroesophageal reflux disease) 07/04/2018   Hematuria 07/04/2018   Seizures, 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    Orientation RESPIRATION BLADDER Height & Weight     Self, Time, Situation,  Place  Normal Continent Weight:   Height:  '6\' 1"'$  (185.4 cm)  BEHAVIORAL SYMPTOMS/MOOD NEUROLOGICAL BOWEL NUTRITION STATUS      Continent Diet (see dc summary)  AMBULATORY STATUS COMMUNICATION OF NEEDS Skin   Extensive Assist Verbally Normal                       Personal Care Assistance Level of Assistance  Bathing, Feeding, Dressing Bathing Assistance: Limited assistance Feeding assistance: Independent Dressing Assistance: Limited assistance     Functional Limitations Info  Sight, Hearing, Speech Sight Info: Adequate Hearing Info: Adequate Speech Info: Adequate    SPECIAL CARE FACTORS FREQUENCY  PT (By licensed PT), OT (By licensed OT)     PT Frequency: 5x week OT Frequency: 3x week            Contractures Contractures Info: Not present    Additional Factors Info  Code Status, Allergies Code Status Info: Full Allergies Info: NKA           Current Medications (04/06/2022):  This is the current hospital active medication list Current Facility-Administered Medications  Medication Dose Route Frequency Provider Last Rate Last Admin   acetaminophen (TYLENOL) tablet 650 mg  650 mg Oral Q4H PRN Hosie Poisson, MD       Or   acetaminophen (TYLENOL) suppository 650 mg  650 mg Rectal Q6H PRN Hosie Poisson, MD       albuterol (VENTOLIN HFA) 108 (90 Base) MCG/ACT inhaler 2 puff  2 puff Inhalation Q6H PRN Adefeso, Oladapo, DO       calcium carbonate (OS-CAL - dosed in mg  of elemental calcium) tablet 1,250 mg  500 mg of elemental calcium Oral Q breakfast Hosie Poisson, MD   1,250 mg at 04/06/22 1042   enoxaparin (LOVENOX) injection 40 mg  40 mg Subcutaneous Q24H Adefeso, Oladapo, DO   40 mg at 04/06/22 1046   lamoTRIgine (LAMICTAL XR) 24 hour tablet 400 mg  400 mg Oral QHS Adefeso, Oladapo, DO   400 mg at 04/05/22 2247   nirmatrelvir/ritonavir EUA (PAXLOVID) 3 tablet  3 tablet Oral BID Adefeso, Oladapo, DO   3 tablet at 04/06/22 1045   pantoprazole (PROTONIX) EC tablet 40  mg  40 mg Oral Daily Adefeso, Oladapo, DO   40 mg at 04/06/22 1042   sertraline (ZOLOFT) tablet 200 mg  200 mg Oral Daily Adefeso, Oladapo, DO   200 mg at 04/06/22 1043   traMADol (ULTRAM) tablet 50 mg  50 mg Oral Q6H PRN Hosie Poisson, MD   50 mg at 04/05/22 1013     Discharge Medications: Please see discharge summary for a list of discharge medications.  Relevant Imaging Results:  Relevant Lab Results:   Additional Information SSN: [place], LCSW

## 2022-04-06 NOTE — TOC Progression Note (Signed)
Transition of Care [facility]) - Progression Note    Patient Details  Name: Dale Bender MRN: [id number] Date of Birth: Jun 03, 1971  [facility] CM/SW Contact  Shade Flood, LCSW Phone Number: 04/06/2022, 3:25 PM  Clinical Narrative:     TOC following. Spoke with pt and his wife today to review dc planning. Pt and his wife express that they would like for pt to go to SNF rehab at dc. They are aware that due to his Covid diagnosis, he would not be able to get a SNF bed until day 11 post diagnosis. Reviewed CMS provider options and will refer as requested. Pt's wife states that if pt makes big improvement before the 11 day period is up, they will take pt home with [facility]. She asks that any contact regarding dc planning be made with her as pt has some short term memory impairment.   Updated MD. Donella Stade will follow.   Expected Discharge Plan: [place] Barriers to Discharge: Continued Medical Work up  Expected Discharge Plan and Services Expected Discharge Plan: [place] In-house Referral: Clinical Social Work   Post Acute Care Choice: [facility] Living arrangements for the past 2 months: Single Family Home                                       Social Determinants of Health (SDOH) Interventions    Readmission Risk Interventions     No data to display

## 2022-04-06 NOTE — Progress Notes (Signed)
PROGRESS NOTE    JACCOB Bender  OVZ:[id number] DOB: 02/07/1971 DOA: 04/03/2022 PCP: Denny Levy, PA    Chief Complaint  Patient presents with   Altered Mental Status    Brief Narrative:  Dale Bender is a 51 y.o. male with medical history significant of GERD, depression, seizures who presents to the emergency department via EMS after witnessed collapse.  Patient states that the last thing he remembered was that he was at church.   Patient was reported to be tachypneic, confused, febrile at 100.28F, but was without any breathing difficulty on arrival to the ED.  SARS coronavirus 2 was positive. CT head without contrast and CT cervical spine without contrast showed negative noncontrasted CT appearance of the brain and degenerative changes of the cervical spine.  No acute osseous abnormality. Chest x-ray showed low volumes.  No active cardiopulmonary disease    Assessment & Plan:   Principal Problem:   Syncope and collapse Active Problems:   GERD (gastroesophageal reflux disease)   Seizure (HCC)   COVID-19 virus infection   Hypokalemia   Thrombocytopenia (HCC)   Hypocalcemia   Syncope   Syncope and collapse Unclear etiology, suspect from dehydration from COVID 19 Infection.  Monitor on telemetry to evaluate for arrhythmias.  Echo is unremarkable.  Negative orthostatics. Carotid duplex is negative.  He is alert and oriented and appears to be back to baseline mental status.  EKG shows NSR and prolonged PR interval.  Therapy eval to SNF    COVID 19 INFECTION.  Currently on Paxlovid.  Follow inflammatory markers.    Right sided lower extremity weakness from 3 years.  Worse with myalgias this admission.  Pain control and therapy eval recommending snf    Hypokalemia replaced.    Mild thrombocytopenia:  Monitor. Recheck in am.    GERD: Stable. Continue with PPI.    Depression  Continue with zoloft.    DVT prophylaxis: lovenox.  Code Status: full  code.  Family Communication: none at bedside.  Disposition:   Status is: Observation The patient will require care spanning > 2 midnights and should be moved to inpatient because: syncope work up and [place].    Level of care: Telemetry Consultants:  NONE.   Procedures: NONE.   Antimicrobials: Paxlovid.    Subjective: BACK TO BASELINE.   Objective: Vitals:   04/05/22 1000 04/05/22 1303 04/05/22 2151 04/06/22 0500  BP:  118/79 127/78 115/67  Pulse:  71 77 74  Resp:  '18 18 18  '$ Temp:   98.9 F (37.2 C) 98.4 F (36.9 C)  TempSrc:   Oral   SpO2:  100% 100% 100%  Height: '6\' 1"'$  (1.854 m)       Intake/Output Summary (Last 24 hours) at 04/06/2022 1514 Last data filed at 04/06/2022 0500 Gross per 24 hour  Intake 720 ml  Output 1800 ml  Net -1080 ml    There were no vitals filed for this visit.  Examination:  General exam: Appears calm and comfortable  Respiratory system: Clear to auscultation. Respiratory effort normal. Cardiovascular system: S1 & S2 heard, RRR. No JVD,  No pedal edema. Gastrointestinal system: Abdomen is nondistended, soft and nontender. Normal bowel sounds heard. Central nervous system: Alert and oriented. No focal neurological deficits. Extremities: Symmetric 5 x 5 power. Skin: No rashes, lesions or ulcers Psychiatry: Mood & affect appropriate.       Data Reviewed: I have personally reviewed following labs and imaging studies  CBC: Recent Labs  Lab 04/03/22  1948 04/05/22 0450 04/06/22 0653  WBC 4.5 4.6 4.4  NEUTROABS 3.2 2.7 2.5  HGB 13.1 14.3 15.0  HCT 38.2* 42.5 43.9  MCV 86.2 84.5 85.1  PLT 140* 136* 142*     Basic Metabolic Panel: Recent Labs  Lab 04/03/22 1948 04/04/22 1046 04/05/22 0450 04/06/22 0653  NA 141 139 137 141  K 3.1* 4.4 4.7 4.4  CL 116* 107 105 104  CO2 '22 27 28 29  '$ GLUCOSE 90 104* 87 104*  BUN '12 13 14 16  '$ CREATININE 1.09 1.31* 1.35* 1.49*  CALCIUM 6.7* 8.6* 9.1 9.0  MG  --   --   --  2.3      GFR: CrCl cannot be calculated (Unknown ideal weight.).  Liver Function Tests: Recent Labs  Lab 04/03/22 1948  AST 17  ALT 17  ALKPHOS 69  BILITOT 0.7  PROT 5.2*  ALBUMIN 3.3*     CBG: Recent Labs  Lab 04/03/22 1947 04/05/22 2152  GLUCAP 104* 128*      Recent Results (from the past 240 hour(s))  Resp Panel by RT-PCR (Flu A&B, Covid) Anterior Nasal Swab     Status: Abnormal   Collection Time: 04/03/22  9:20 PM   Specimen: Anterior Nasal Swab  Result Value Ref Range Status   SARS Coronavirus 2 by RT PCR POSITIVE (A) NEGATIVE Final    Comment: (NOTE) SARS-CoV-2 target nucleic acids are DETECTED.  The SARS-CoV-2 RNA is generally detectable in upper respiratory specimens during the acute phase of infection. Positive results are indicative of the presence of the identified virus, but do not rule out bacterial infection or co-infection with other pathogens not detected by the test. Clinical correlation with patient history and other diagnostic information is necessary to determine patient infection status. The expected result is Negative.  Fact Sheet for Patients: EntrepreneurPulse.com.au  Fact Sheet for Healthcare Providers: IncredibleEmployment.be  This test is not yet approved or cleared by the [place] FDA and  has been authorized for detection and/or diagnosis of SARS-CoV-2 by FDA under an Emergency Use Authorization (EUA).  This EUA will remain in effect (meaning this test can be used) for the duration of  the COVID-19 declaration under Section 564(b)(1) of the A ct, 21 U.S.C. section 360bbb-3(b)(1), unless the authorization is terminated or revoked sooner.     Influenza A by PCR NEGATIVE NEGATIVE Final   Influenza B by PCR NEGATIVE NEGATIVE Final    Comment: (NOTE) The Xpert Xpress SARS-CoV-2/FLU/RSV plus assay is intended as an aid in the diagnosis of influenza from Nasopharyngeal swab specimens and should  not be used as a sole basis for treatment. Nasal washings and aspirates are unacceptable for Xpert Xpress SARS-CoV-2/FLU/RSV testing.  Fact Sheet for Patients: EntrepreneurPulse.com.au  Fact Sheet for Healthcare Providers: IncredibleEmployment.be  This test is not yet approved or cleared by the [place] FDA and has been authorized for detection and/or diagnosis of SARS-CoV-2 by FDA under an Emergency Use Authorization (EUA). This EUA will remain in effect (meaning this test can be used) for the duration of the COVID-19 declaration under Section 564(b)(1) of the Act, 21 U.S.C. section 360bbb-3(b)(1), unless the authorization is terminated or revoked.  Performed at [facility], [address], [place], [place] [address]          Radiology Studies: No results found.      Scheduled Meds:  calcium carbonate  500 mg of elemental calcium Oral Q breakfast   enoxaparin (LOVENOX) injection  40 mg Subcutaneous Q24H  lamoTRIgine  400 mg Oral QHS   nirmatrelvir/ritonavir EUA  3 tablet Oral BID   pantoprazole  40 mg Oral Daily   sertraline  200 mg Oral Daily   Continuous Infusions:   LOS: 1 day    Time spent: 36 minutes.     Hosie Poisson, MD [facility]   To contact the attending provider between 7A-7P or the covering provider during after hours 7P-7A, please log into the web site [email or web address] and access using universal  password for that web site. If you do not have the password, please call the hospital operator.  04/06/2022, 3:14 PM

## 2022-04-07 DIAGNOSIS — R55 Syncope and collapse: Secondary | ICD-10-CM | POA: Diagnosis not present

## 2022-04-07 DIAGNOSIS — U071 COVID-19: Secondary | ICD-10-CM | POA: Diagnosis not present

## 2022-04-07 DIAGNOSIS — K219 Gastro-esophageal reflux disease without esophagitis: Secondary | ICD-10-CM | POA: Diagnosis not present

## 2022-04-07 LAB — CBC WITH DIFFERENTIAL/PLATELET
Abs Immature Granulocytes: 0.01 10*3/uL (ref 0.00–0.07)
Basophils Absolute: 0 10*3/uL (ref 0.0–0.1)
Basophils Relative: 0 %
Eosinophils Absolute: 0.1 10*3/uL (ref 0.0–0.5)
Eosinophils Relative: 2 %
HCT: 45 % (ref 39.0–52.0)
Hemoglobin: 15.2 g/dL (ref 13.0–17.0)
Immature Granulocytes: 0 %
Lymphocytes Relative: 34 %
Lymphs Abs: 1.5 10*3/uL (ref 0.7–4.0)
MCH: 28.6 pg (ref 26.0–34.0)
MCHC: 33.8 g/dL (ref 30.0–36.0)
MCV: 84.7 fL (ref 80.0–100.0)
Monocytes Absolute: 0.4 10*3/uL (ref 0.1–1.0)
Monocytes Relative: 10 %
Neutro Abs: 2.4 10*3/uL (ref 1.7–7.7)
Neutrophils Relative %: 54 %
Platelets: 143 10*3/uL — ABNORMAL LOW (ref 150–400)
RBC: 5.31 MIL/uL (ref 4.22–5.81)
RDW: 12.2 % (ref 11.5–15.5)
WBC: 4.5 10*3/uL (ref 4.0–10.5)
nRBC: 0 % (ref 0.0–0.2)

## 2022-04-07 LAB — EXPECTORATED SPUTUM ASSESSMENT W GRAM STAIN, RFLX TO RESP C

## 2022-04-07 LAB — BASIC METABOLIC PANEL
Anion gap: 6 (ref 5–15)
BUN: 15 mg/dL (ref 6–20)
CO2: 29 mmol/L (ref 22–32)
Calcium: 8.8 mg/dL — ABNORMAL LOW (ref 8.9–10.3)
Chloride: 105 mmol/L (ref 98–111)
Creatinine, Ser: 1.4 mg/dL — ABNORMAL HIGH (ref 0.61–1.24)
GFR, Estimated: >60 mL/min (ref >60)
Glucose, Bld: 102 mg/dL — ABNORMAL HIGH (ref 70–99)
Potassium: 4.2 mmol/L (ref 3.5–5.1)
Sodium: 140 mmol/L (ref 135–145)

## 2022-04-07 LAB — MAGNESIUM: Magnesium: 2.2 mg/dL (ref 1.7–2.4)

## 2022-04-07 LAB — C-REACTIVE PROTEIN: CRP: 1.7 mg/dL — ABNORMAL HIGH (ref <1.0)

## 2022-04-07 LAB — FERRITIN: Ferritin: 147 ng/mL (ref 24–336)

## 2022-04-07 MED ORDER — GUAIFENESIN-DM 100-10 MG/5ML PO SYRP: 5.0000 mL | ORAL_SOLUTION | ORAL | Status: DC | PRN

## 2022-04-07 NOTE — Plan of Care (Signed)
Patient remains on airborne/contact isolation. No supplemental O2 requirement. No acute events overnight.   Problem: Education: Goal: Knowledge of General Education information will improve Description: Including pain rating scale, medication(s)/side effects and non-pharmacologic comfort measures Outcome: Progressing   Problem: Health Behavior/Discharge Planning: Goal: Ability to manage health-related needs will improve Outcome: Progressing   Problem: Clinical Measurements: Goal: Ability to maintain clinical measurements within normal limits will improve Outcome: Progressing Goal: Will remain free from infection Outcome: Progressing Goal: Diagnostic test results will improve Outcome: Progressing Goal: Respiratory complications will improve Outcome: Progressing Goal: Cardiovascular complication will be avoided Outcome: Progressing   Problem: Activity: Goal: Risk for activity intolerance will decrease Outcome: Progressing   Problem: Nutrition: Goal: Adequate nutrition will be maintained Outcome: Progressing   Problem: Coping: Goal: Level of anxiety will decrease Outcome: Progressing   Problem: Elimination: Goal: Will not experience complications related to bowel motility Outcome: Progressing Goal: Will not experience complications related to urinary retention Outcome: Progressing   Problem: Pain Managment: Goal: General experience of comfort will improve Outcome: Progressing   Problem: Safety: Goal: Ability to remain free from injury will improve Outcome: Progressing   Problem: Skin Integrity: Goal: Risk for impaired skin integrity will decrease Outcome: Progressing   Problem: Education: Goal: Knowledge of risk factors and measures for prevention of condition will improve Outcome: Progressing   Problem: Coping: Goal: Psychosocial and spiritual needs will be supported Outcome: Progressing   Problem: Respiratory: Goal: Will maintain a patent airway Outcome:  Progressing Goal: Complications related to the disease process, condition or treatment will be avoided or minimized Outcome: Progressing

## 2022-04-07 NOTE — Progress Notes (Signed)
PROGRESS NOTE    Dale Bender  EGB:[id number] DOB: 03-14-1971 DOA: 04/03/2022 PCP: Denny Levy, PA    Chief Complaint  Patient presents with   Altered Mental Status    Brief Narrative:  Dale Bender is a 51 y.o. male with medical history significant of GERD, depression, seizures who presents to the emergency department via EMS after witnessed collapse.  Patient states that the last thing he remembered was that he was at church.   Patient was reported to be tachypneic, confused, febrile at 100.66F, but was without any breathing difficulty on arrival to the ED.  SARS coronavirus 2 was positive. CT head without contrast and CT cervical spine without contrast showed negative noncontrasted CT appearance of the brain and degenerative changes of the cervical spine.  No acute osseous abnormality. Chest x-ray showed low volumes.  No active cardiopulmonary disease Pt seen and examined today, no new complaints.     Assessment & Plan:   Principal Problem:   Syncope and collapse Active Problems:   GERD (gastroesophageal reflux disease)   Seizure (HCC)   COVID-19 virus infection   Hypokalemia   Thrombocytopenia (HCC)   Hypocalcemia   Syncope   Syncope and collapse Unclear etiology, suspect from dehydration from COVID 19 Infection.  Monitor on telemetry to evaluate for arrhythmias. Negative for arrhythmias.  Echo is unremarkable.  Negative orthostatics. Carotid duplex is negative.  EKG shows NSR and prolonged PR interval.  Therapy eval to SNF    COVID 19 INFECTION.  Currently on Paxlovid.  Follow inflammatory markers.  Pt reports cough, robitussin added to the regimen.  Sputum cultures ordered.    Right sided lower extremity weakness from 3 years.  Worse with myalgias this admission.  Pain control and therapy eval recommending snf  Currently waiting for SNF bed.    Hypokalemia replaced.    Mild thrombocytopenia:  Improving stable.    GERD: Stable.  Continue with PPI.    Depression  Continue with zoloft.    DVT prophylaxis: lovenox.  Code Status: full code.  Family Communication: none at bedside.  Disposition:   Status is: inpatient.    Level of care: Med-Surg Consultants:  NONE.   Procedures: NONE.   Antimicrobials: Paxlovid.    Subjective: No new complaints other than occasional cough.   Objective: Vitals:   04/06/22 1856 04/06/22 1945 04/06/22 2209 04/07/22 1233  BP: 133/88  122/78 (!) 132/90  Pulse: 81  76   Resp: '16  18 20  '$ Temp: 98.8 F (37.1 C)  98.7 F (37.1 C) 98.3 F (36.8 C)  TempSrc: Oral  Oral Oral  SpO2: 99%  98% 98%  Weight:  119.1 kg    Height:  '6\' 1"'$  (1.854 m)      Intake/Output Summary (Last 24 hours) at 04/07/2022 1358 Last data filed at 04/07/2022 1236 Gross per 24 hour  Intake --  Output 1201 ml  Net -1201 ml    Filed Weights   04/06/22 1945  Weight: 119.1 kg    Examination:  General exam: Appears calm and comfortable  Respiratory system: Clear to auscultation. Respiratory effort normal. Cardiovascular system: S1 & S2 heard, RRR. No JVD, No pedal edema. Gastrointestinal system: Abdomen is nondistended, soft and nontender. Normal bowel sounds heard. Central nervous system: Alert and oriented. No focal neurological deficits. Extremities: Symmetric 5 x 5 power. Skin: No rashes, lesions or ulcers Psychiatry:  Mood & affect appropriate.        Data Reviewed: I have personally reviewed following  labs and imaging studies  CBC: Recent Labs  Lab 04/03/22 1948 04/05/22 0450 04/06/22 0653 04/07/22 0546  WBC 4.5 4.6 4.4 4.5  NEUTROABS 3.2 2.7 2.5 2.4  HGB 13.1 14.3 15.0 15.2  HCT 38.2* 42.5 43.9 45.0  MCV 86.2 84.5 85.1 84.7  PLT 140* 136* 142* 143*     Basic Metabolic Panel: Recent Labs  Lab 04/03/22 1948 04/04/22 1046 04/05/22 0450 04/06/22 0653 04/07/22 0546  NA 141 139 137 141 140  K 3.1* 4.4 4.7 4.4 4.2  CL 116* 107 105 104 105  CO2 '22 27 28 29 29   '$ GLUCOSE 90 104* 87 104* 102*  BUN '12 13 14 16 15  '$ CREATININE 1.09 1.31* 1.35* 1.49* 1.40*  CALCIUM 6.7* 8.6* 9.1 9.0 8.8*  MG  --   --   --  2.3 2.2     GFR: Estimated Creatinine Clearance: 83.5 mL/min (A) (by C-G formula based on SCr of 1.4 mg/dL (H)).  Liver Function Tests: Recent Labs  Lab 04/03/22 1948  AST 17  ALT 17  ALKPHOS 69  BILITOT 0.7  PROT 5.2*  ALBUMIN 3.3*     CBG: Recent Labs  Lab 04/03/22 1947 04/05/22 2152  GLUCAP 104* 128*      Recent Results (from the past 240 hour(s))  Resp Panel by RT-PCR (Flu A&B, Covid) Anterior Nasal Swab     Status: Abnormal   Collection Time: 04/03/22  9:20 PM   Specimen: Anterior Nasal Swab  Result Value Ref Range Status   SARS Coronavirus 2 by RT PCR POSITIVE (A) NEGATIVE Final    Comment: (NOTE) SARS-CoV-2 target nucleic acids are DETECTED.  The SARS-CoV-2 RNA is generally detectable in upper respiratory specimens during the acute phase of infection. Positive results are indicative of the presence of the identified virus, but do not rule out bacterial infection or co-infection with other pathogens not detected by the test. Clinical correlation with patient history and other diagnostic information is necessary to determine patient infection status. The expected result is Negative.  Fact Sheet for Patients: EntrepreneurPulse.com.au  Fact Sheet for Healthcare Providers: IncredibleEmployment.be  This test is not yet approved or cleared by the [place] FDA and  has been authorized for detection and/or diagnosis of SARS-CoV-2 by FDA under an Emergency Use Authorization (EUA).  This EUA will remain in effect (meaning this test can be used) for the duration of  the COVID-19 declaration under Section 564(b)(1) of the A ct, 21 U.S.C. section 360bbb-3(b)(1), unless the authorization is terminated or revoked sooner.     Influenza A by PCR NEGATIVE NEGATIVE Final   Influenza  B by PCR NEGATIVE NEGATIVE Final    Comment: (NOTE) The Xpert Xpress SARS-CoV-2/FLU/RSV plus assay is intended as an aid in the diagnosis of influenza from Nasopharyngeal swab specimens and should not be used as a sole basis for treatment. Nasal washings and aspirates are unacceptable for Xpert Xpress SARS-CoV-2/FLU/RSV testing.  Fact Sheet for Patients: EntrepreneurPulse.com.au  Fact Sheet for Healthcare Providers: IncredibleEmployment.be  This test is not yet approved or cleared by the [place] FDA and has been authorized for detection and/or diagnosis of SARS-CoV-2 by FDA under an Emergency Use Authorization (EUA). This EUA will remain in effect (meaning this test can be used) for the duration of the COVID-19 declaration under Section 564(b)(1) of the Act, 21 U.S.C. section 360bbb-3(b)(1), unless the authorization is terminated or revoked.  Performed at [facility], [address] [place], [place] [address]  Radiology Studies: No results found.      Scheduled Meds:  calcium carbonate  500 mg of elemental calcium Oral Q breakfast   enoxaparin (LOVENOX) injection  40 mg Subcutaneous Q24H   lamoTRIgine  400 mg Oral QHS   nirmatrelvir/ritonavir EUA  3 tablet Oral BID   pantoprazole  40 mg Oral Daily   sertraline  200 mg Oral Daily   Continuous Infusions:   LOS: 2 days    Time spent: 36 minutes.     Hosie Poisson, MD [facility]   To contact the attending provider between 7A-7P or the covering provider during after hours 7P-7A, please log into the web site [email or web address] and access using universal [place] password for that web site. If you do not have the password, please call the hospital operator.  04/07/2022, 1:58 PM

## 2022-04-07 NOTE — Plan of Care (Signed)

## 2022-04-08 ENCOUNTER — Ambulatory Visit (HOSPITAL_COMMUNITY): Payer: Medicare HMO | Admitting: Clinical

## 2022-04-08 ENCOUNTER — Encounter (HOSPITAL_COMMUNITY): Payer: Self-pay

## 2022-04-08 DIAGNOSIS — U071 COVID-19: Secondary | ICD-10-CM | POA: Diagnosis not present

## 2022-04-08 DIAGNOSIS — K219 Gastro-esophageal reflux disease without esophagitis: Secondary | ICD-10-CM | POA: Diagnosis not present

## 2022-04-08 DIAGNOSIS — R55 Syncope and collapse: Secondary | ICD-10-CM | POA: Diagnosis not present

## 2022-04-08 NOTE — Progress Notes (Signed)
Physical Therapy Treatment Patient Details Name: Dale Bender MRN: [id number] DOB: Oct 21, 1970 Today's Date: 04/08/2022   History of Present Illness Dale Bender is a 51 y.o. male with medical history significant of GERD, depression, seizures who presents to the emergency department via EMS after witnessed collapse.  Patient states that the last thing he remembered was that he was at church.  Most of the history was obtained from ED physician and ED medical record.  Per report, patient was reported to have collapsed to the ground at church.  Family member at the scene provided CPR, EMS was activated and on arrival of EMS team, patient was awake, but with altered mental status.  Rescue breaths via bagging was prior via EMS en route.  Patient was reported to be tachypneic, confused, febrile at 100.18F, but was without any breathing difficulty on arrival to the ED.    PT Comments    Patient does not require assist for bed mobility today. He demonstrates good sitting balance and sitting tolerance at EOB while completing seated exercises. He has c/o R knee pain and weakness with exercise. He requires min/mod assist to transfer to standing with RW 3x with RLE and occasionally whole body trembling upon standing. He demonstrates poor balance and standing tolerance stating high fatigue at end of session. Patient will benefit from continued skilled physical therapy in hospital and recommended venue below to increase strength, balance, endurance for safe ADLs and gait.    Recommendations for follow up therapy are one component of a multi-disciplinary discharge planning process, led by the attending physician.  Recommendations may be updated based on patient status, additional functional criteria and insurance authorization.  Follow Up Recommendations  Skilled nursing-short term rehab (<3 hours/day) Can patient physically be transported by private vehicle: Yes   Assistance Recommended at Discharge  Intermittent Supervision/Assistance  Patient can return home with the following A lot of help with walking and/or transfers;A little help with bathing/dressing/bathroom;Assistance with cooking/housework;Help with stairs or ramp for entrance   Equipment Recommendations  Rolling walker (2 wheels)    Recommendations for Other Services       Precautions / Restrictions Precautions Precautions: Fall Restrictions Weight Bearing Restrictions: No     Mobility  Bed Mobility Overal bed mobility: Modified Independent             General bed mobility comments: labored but completes without assist    Transfers Overall transfer level: Needs assistance Equipment used: Rolling walker (2 wheels) Transfers: Sit to/from Stand Sit to Stand: Min assist, Mod assist           General transfer comment: slow labored shaky movement, RLE trembling upon standing, transfer to standing x 3    Ambulation/Gait                   Stairs             Wheelchair Mobility    Modified Rankin (Stroke Patients Only)       Balance Overall balance assessment: Needs assistance Sitting-balance support: Feet supported, Bilateral upper extremity supported Sitting balance-Leahy Scale: Good Sitting balance - Comments: seated at EOB   Standing balance support: During functional activity, Bilateral upper extremity supported Standing balance-Leahy Scale: Poor Standing balance comment: using RW                            Cognition Arousal/Alertness: Awake/alert Behavior During Therapy: WFL for tasks assessed/performed Overall Cognitive Status: Within  Functional Limits for tasks assessed                                          Exercises General Exercises - Lower Extremity Ankle Circles/Pumps: AROM, Both, 10 reps, Seated Long Arc Quad: AROM, Both, 10 reps, Seated Hip Flexion/Marching: AROM, Both, 10 reps, Seated    General Comments        Pertinent  Vitals/Pain Pain Assessment Pain Assessment: No/denies pain    Home Living                          Prior Function            PT Goals (current goals can now be found in the care plan section) Acute Rehab PT Goals Patient Stated Goal: return home with family to assist PT Goal Formulation: With patient Time For Goal Achievement: 04/18/22 Potential to Achieve Goals: Good Progress towards PT goals: Progressing toward goals    Frequency    Min 3X/week      PT Plan Current plan remains appropriate    Co-evaluation              AM-PAC PT "6 Clicks" Mobility   Outcome Measure  Help needed turning from your back to your side while in a flat bed without using bedrails?: None Help needed moving from lying on your back to sitting on the side of a flat bed without using bedrails?: A Little Help needed moving to and from a bed to a chair (including a wheelchair)?: A Lot Help needed standing up from a chair using your arms (e.g., wheelchair or bedside chair)?: A Lot Help needed to walk in hospital room?: A Lot Help needed climbing 3-5 steps with a railing? : Total 6 Click Score: 14    End of Session Equipment Utilized During Treatment: Gait belt Activity Tolerance: Patient tolerated treatment well;Patient limited by fatigue Patient left: in bed;with call bell/phone within reach Nurse Communication: Mobility status PT Visit Diagnosis: Unsteadiness on feet (R26.81);Other abnormalities of gait and mobility (R26.89);Muscle weakness (generalized) (M62.81)     Time: 4332-9518 PT Time Calculation (min) (ACUTE ONLY): 18 min  Charges:  $Therapeutic Activity: 8-22 mins                     2:24 PM, 04/08/22 Mearl Latin PT, DPT Physical Therapist at [facility]

## 2022-04-08 NOTE — Progress Notes (Signed)
PROGRESS NOTE    Dale Bender  URK:[id number] DOB: 07/12/1971 DOA: 04/03/2022 PCP: Denny Levy, PA    Chief Complaint  Patient presents with   Altered Mental Status    Brief Narrative:  Dale Bender is a 51 y.o. male with medical history significant of GERD, depression, seizures who presents to the emergency department via EMS after witnessed collapse.  Patient states that the last thing he remembered was that he was at church.   Patient was reported to be tachypneic, confused, febrile at 100.52F, but was without any breathing difficulty on arrival to the ED.  SARS coronavirus 2 was positive. CT head without contrast and CT cervical spine without contrast showed negative noncontrasted CT appearance of the brain and degenerative changes of the cervical spine.  No acute osseous abnormality. Chest x-ray showed low volumes.  No active cardiopulmonary disease Patient seen and examined. Sputum cultures obtained and pending.  Repeat therapy evaluation ordered today.     Assessment & Plan:   Principal Problem:   Syncope and collapse Active Problems:   GERD (gastroesophageal reflux disease)   Seizure (HCC)   COVID-19 virus infection   Hypokalemia   Thrombocytopenia (HCC)   Hypocalcemia   Syncope   Syncope and collapse Unclear etiology, suspect from dehydration from COVID 19 Infection.  Monitor on telemetry to evaluate for arrhythmias. Negative for arrhythmias.  Echo is unremarkable.  Negative orthostatics. Carotid duplex is negative.  EKG shows NSR and prolonged PR interval.  Therapy eval to SNF    COVID 19 INFECTION. With persistent cough.  Currently on Paxlovid.  Follow inflammatory markers.  Pt reports cough, robitussin added to the regimen.  Sputum cultures ordered and done, results pending.    Right sided lower extremity weakness from 3 years.  Worse with myalgias this admission.  Pain control and therapy eval recommending snf  Currently waiting for SNF  bed.    Hypokalemia replaced.    Mild thrombocytopenia:  Improving and  stable.    GERD: Stable. Continue with PPI.    Depression  Continue with zoloft.    DVT prophylaxis: lovenox.  Code Status: full code.  Family Communication: none at bedside.  Disposition:   Status is: inpatient.    Level of care: Med-Surg Consultants:  NONE.   Procedures: NONE.   Antimicrobials: Paxlovid.    Subjective: Coughing is better. No chest pain. Sob improved. Body aches have improved.   Objective: Vitals:   04/06/22 2209 04/07/22 1233 04/07/22 2147 04/08/22 0615  BP: 122/78 (!) 132/90 133/74 120/82  Pulse: 76  76 75  Resp: '18 20 18 18  '$ Temp: 98.7 F (37.1 C) 98.3 F (36.8 C) 98.5 F (36.9 C) 97.8 F (36.6 C)  TempSrc: Oral Oral Oral Oral  SpO2: 98% 98% 99% 99%  Weight:      Height:        Intake/Output Summary (Last 24 hours) at 04/08/2022 1532 Last data filed at 04/08/2022 1200 Gross per 24 hour  Intake 720 ml  Output 1975 ml  Net -1255 ml    Filed Weights   04/06/22 1945  Weight: 119.1 kg    Examination:  General exam: Appears calm and comfortable  Respiratory system: Clear to auscultation. Respiratory effort normal. Cardiovascular system: S1 & S2 heard, RRR. No JVD, . No pedal edema. Gastrointestinal system: Abdomen is nondistended, soft and nontender. Normal bowel sounds heard. Central nervous system: Alert and oriented. No focal neurological deficits. Extremities: Symmetric 5 x 5 power. Skin: No rashes, lesions  or ulcers Psychiatry: Judgement and insight appear normal. Mood & affect appropriate.         Data Reviewed: I have personally reviewed following labs and imaging studies  CBC: Recent Labs  Lab 04/03/22 1948 04/05/22 0450 04/06/22 0653 04/07/22 0546  WBC 4.5 4.6 4.4 4.5  NEUTROABS 3.2 2.7 2.5 2.4  HGB 13.1 14.3 15.0 15.2  HCT 38.2* 42.5 43.9 45.0  MCV 86.2 84.5 85.1 84.7  PLT 140* 136* 142* 143*     Basic Metabolic  Panel: Recent Labs  Lab 04/03/22 1948 04/04/22 1046 04/05/22 0450 04/06/22 0653 04/07/22 0546  NA 141 139 137 141 140  K 3.1* 4.4 4.7 4.4 4.2  CL 116* 107 105 104 105  CO2 '22 27 28 29 29  '$ GLUCOSE 90 104* 87 104* 102*  BUN '12 13 14 16 15  '$ CREATININE 1.09 1.31* 1.35* 1.49* 1.40*  CALCIUM 6.7* 8.6* 9.1 9.0 8.8*  MG  --   --   --  2.3 2.2     GFR: Estimated Creatinine Clearance: 83.5 mL/min (A) (by C-G formula based on SCr of 1.4 mg/dL (H)).  Liver Function Tests: Recent Labs  Lab 04/03/22 1948  AST 17  ALT 17  ALKPHOS 69  BILITOT 0.7  PROT 5.2*  ALBUMIN 3.3*     CBG: Recent Labs  Lab 04/03/22 1947 04/05/22 2152  GLUCAP 104* 128*      Recent Results (from the past 240 hour(s))  Resp Panel by RT-PCR (Flu A&B, Covid) Anterior Nasal Swab     Status: Abnormal   Collection Time: 04/03/22  9:20 PM   Specimen: Anterior Nasal Swab  Result Value Ref Range Status   SARS Coronavirus 2 by RT PCR POSITIVE (A) NEGATIVE Final    Comment: (NOTE) SARS-CoV-2 target nucleic acids are DETECTED.  The SARS-CoV-2 RNA is generally detectable in upper respiratory specimens during the acute phase of infection. Positive results are indicative of the presence of the identified virus, but do not rule out bacterial infection or co-infection with other pathogens not detected by the test. Clinical correlation with patient history and other diagnostic information is necessary to determine patient infection status. The expected result is Negative.  Fact Sheet for Patients: EntrepreneurPulse.com.au  Fact Sheet for Healthcare Providers: IncredibleEmployment.be  This test is not yet approved or cleared by the [place] FDA and  has been authorized for detection and/or diagnosis of SARS-CoV-2 by FDA under an Emergency Use Authorization (EUA).  This EUA will remain in effect (meaning this test can be used) for the duration of  the COVID-19  declaration under Section 564(b)(1) of the A ct, 21 U.S.C. section 360bbb-3(b)(1), unless the authorization is terminated or revoked sooner.     Influenza A by PCR NEGATIVE NEGATIVE Final   Influenza B by PCR NEGATIVE NEGATIVE Final    Comment: (NOTE) The Xpert Xpress SARS-CoV-2/FLU/RSV plus assay is intended as an aid in the diagnosis of influenza from Nasopharyngeal swab specimens and should not be used as a sole basis for treatment. Nasal washings and aspirates are unacceptable for Xpert Xpress SARS-CoV-2/FLU/RSV testing.  Fact Sheet for Patients: EntrepreneurPulse.com.au  Fact Sheet for Healthcare Providers: IncredibleEmployment.be  This test is not yet approved or cleared by the [place] FDA and has been authorized for detection and/or diagnosis of SARS-CoV-2 by FDA under an Emergency Use Authorization (EUA). This EUA will remain in effect (meaning this test can be used) for the duration of the COVID-19 declaration under Section 564(b)(1) of the Act,  21 U.S.C. section 360bbb-3(b)(1), unless the authorization is terminated or revoked.  Performed at [facility], [address], [place], [place] [address]   Expectorated Sputum Assessment w Gram Stain, Rflx to Resp Cult     Status: None   Collection Time: 04/07/22  8:30 PM   Specimen: Expectorated Sputum  Result Value Ref Range Status   Specimen Description EXPECTORATED SPUTUM  Final   Special Requests NONE  Final   Sputum evaluation   Final    THIS SPECIMEN IS ACCEPTABLE FOR SPUTUM CULTURE Performed at [facility], [address], [place], [place] [address]    Report Status 04/07/2022 FINAL  Final  Culture, Respiratory w Gram Stain     Status: None (Preliminary result)   Collection Time: 04/07/22  8:30 PM  Result Value Ref Range Status   Specimen Description   Final    EXPECTORATED SPUTUM Performed at [facility], [address] [place], [place] [address]    Special Requests    Final    NONE Reflexed from [id number] Performed at [facility], [address] [place], [place] [address]    Gram Stain   Final    NO WBC SEEN ABUNDANT GRAM POSITIVE COCCI IN PAIRS IN CHAINS FEW GRAM NEGATIVE RODS RARE GRAM POSITIVE RODS Performed at [facility], [place] [address] [place], [place] [address]    Culture PENDING  Incomplete   Report Status PENDING  Incomplete         Radiology Studies: No results found.      Scheduled Meds:  calcium carbonate  500 mg of elemental calcium Oral Q breakfast   enoxaparin (LOVENOX) injection  40 mg Subcutaneous Q24H   lamoTRIgine  400 mg Oral QHS   pantoprazole  40 mg Oral Daily   sertraline  200 mg Oral Daily   Continuous Infusions:   LOS: 3 days    Time spent: 36 minutes.     Hosie Poisson, MD [facility]   To contact the attending provider between 7A-7P or the covering provider during after hours 7P-7A, please log into the web site [email or web address] and access using universal [place] password for that web site. If you do not have the password, please call the hospital operator.  04/08/2022, 3:32 PM

## 2022-04-09 DIAGNOSIS — E876 Hypokalemia: Secondary | ICD-10-CM | POA: Diagnosis not present

## 2022-04-09 DIAGNOSIS — K219 Gastro-esophageal reflux disease without esophagitis: Secondary | ICD-10-CM | POA: Diagnosis not present

## 2022-04-09 DIAGNOSIS — U071 COVID-19: Secondary | ICD-10-CM | POA: Diagnosis not present

## 2022-04-09 DIAGNOSIS — R55 Syncope and collapse: Secondary | ICD-10-CM | POA: Diagnosis not present

## 2022-04-09 MED ORDER — GUAIFENESIN-DM 100-10 MG/5ML PO SYRP: 5.0000 mL | ORAL_SOLUTION | ORAL | 0 refills | Status: DC | PRN

## 2022-04-09 NOTE — Progress Notes (Signed)
Pt has discharge orders, discharge teaching given and no further questions at this time.

## 2022-04-09 NOTE — Discharge Summary (Signed)
Physician Discharge Summary   Patient: Dale Bender MRN: [id number] DOB: [birth year]  Admit date:     04/03/2022  Discharge date: {dischdate:26783}  Discharge Physician: Hosie Poisson   PCP: Denny Levy, PA   Recommendations at discharge:  {Tip this will not be part of the note when signed- Example include specific recommendations for outpatient follow-up, pending tests to follow-up on. (Optional):26781}  ***  Discharge Diagnoses: Principal Problem:   Syncope and collapse Active Problems:   GERD (gastroesophageal reflux disease)   Seizure (HCC)   COVID-19 virus infection   Hypokalemia   Thrombocytopenia (HCC)   Hypocalcemia   Syncope  Resolved Problems:   * No resolved hospital problems. [facility] Course: No notes on file  Assessment and Plan: No notes have been filed under this hospital service. Service: Hospitalist     {Tip this will not be part of the note when signed Body mass index is 34.64 kg/m. , ,  (Optional):26781}  {(NOTE) Pain control PDMP Statment (Optional):26782} Consultants: *** Procedures performed: ***  Disposition: {Plan; Disposition:26390} Diet recommendation:  Discharge Diet Orders (From admission, onward)     Start     Ordered   04/09/22 0000  Diet - low sodium heart healthy        04/09/22 1129           {Diet_Plan:26776} DISCHARGE MEDICATION: Allergies as of 04/09/2022   No Known Allergies      Medication List     STOP taking these medications    gabapentin 300 MG capsule Commonly known as: NEURONTIN   meloxicam 7.5 MG tablet Commonly known as: MOBIC       TAKE these medications    albuterol 108 (90 Base) MCG/ACT inhaler Commonly known as: VENTOLIN HFA Inhale 2 puffs into the lungs every 6 (six) hours as needed for wheezing or shortness of breath.   buprenorphine 10 MCG/HR Ptwk Commonly known as: BUTRANS 1 patch once a week.   guaiFENesin-dextromethorphan 100-10 MG/5ML syrup Commonly known as:  ROBITUSSIN DM Take 5-10 mLs by mouth every 4 (four) hours as needed for cough.   LamoTRIgine 200 MG Tb24 24 hour tablet TAKE TWO TABLETS BY MOUTH AT BEDTIME   omeprazole 40 MG capsule Commonly known as: PRILOSEC Take 40 mg by mouth daily.   sertraline 100 MG tablet Commonly known as: ZOLOFT TAKE 2 TABLETS BY MOUTH EVERY DAY   ZTlido 1.8 % Ptch Generic drug: Lidocaine Apply 1 patch topically 3 (three) times daily.        Discharge Exam: Filed Weights   04/06/22 1945  Weight: 119.1 kg   ***  Condition at discharge: {DC Condition:26389}  The results of significant diagnostics from this hospitalization (including imaging, microbiology, ancillary and laboratory) are listed below for reference.   Imaging Studies: ECHOCARDIOGRAM COMPLETE  Result Date: 04/04/2022    ECHOCARDIOGRAM REPORT   Patient Name:   Dale Bender Date of Exam: 04/04/2022 Medical Rec #:  [id number]        Height:       73.0 in Accession #:    [id number]       Weight:       262.0 lb Date of Birth:  05/27/1971        BSA:          2.413 m Patient Age:    51 years         BP:           128/83 mmHg Patient Gender: M  HR:           82 bpm. Exam Location:  [facility] Procedure: 2D Echo, Cardiac Doppler and Color Doppler Indications:    Syncope  History:        Patient has prior history of Echocardiogram examinations, most                 recent 07/28/2019. Signs/Symptoms:Syncope; Risk Factors:Former                 Smoker. Seizure disorder, COVID +.  Sonographer:    Wenda Low Referring Phys: [id number] OLADAPO ADEFESO IMPRESSIONS  1. Left ventricular ejection fraction, by estimation, is 60 to 65%. The left ventricle has normal function. The left ventricle has no regional wall motion abnormalities. There is moderate left ventricular hypertrophy. Left ventricular diastolic parameters were normal.  2. Right ventricular systolic function is normal. The right ventricular size is normal. There is normal  pulmonary artery systolic pressure.  3. The mitral valve is normal in structure. No evidence of mitral valve regurgitation. No evidence of mitral stenosis.  4. The aortic valve is tricuspid. Aortic valve regurgitation is not visualized. No aortic stenosis is present.  5. The inferior vena cava is normal in size with greater than 50% respiratory variability, suggesting right atrial pressure of 3 mmHg. FINDINGS  Left Ventricle: Left ventricular ejection fraction, by estimation, is 60 to 65%. The left ventricle has normal function. The left ventricle has no regional wall motion abnormalities. The left ventricular internal cavity size was normal in size. There is  moderate left ventricular hypertrophy. Left ventricular diastolic parameters were normal. Right Ventricle: The right ventricular size is normal. Right vetricular wall thickness was not well visualized. Right ventricular systolic function is normal. There is normal pulmonary artery systolic pressure. The tricuspid regurgitant velocity is 1.98 m/s, and with an assumed right atrial pressure of 8 mmHg, the estimated right ventricular systolic pressure is 84.1 mmHg. Left Atrium: Left atrial size was normal in size. Right Atrium: Right atrial size was normal in size. Pericardium: There is no evidence of pericardial effusion. Mitral Valve: The mitral valve is normal in structure. No evidence of mitral valve regurgitation. No evidence of mitral valve stenosis. MV peak gradient, 4.2 mmHg. The mean mitral valve gradient is 2.0 mmHg. Tricuspid Valve: The tricuspid valve is normal in structure. Tricuspid valve regurgitation is not demonstrated. No evidence of tricuspid stenosis. Aortic Valve: The aortic valve is tricuspid. Aortic valve regurgitation is not visualized. No aortic stenosis is present. Aortic valve mean gradient measures 4.0 mmHg. Aortic valve peak gradient measures 7.8 mmHg. Aortic valve area, by VTI measures 2.75 cm. Pulmonic Valve: The pulmonic valve was  not well visualized. Pulmonic valve regurgitation is not visualized. No evidence of pulmonic stenosis. Aorta: The aortic root is normal in size and structure. Venous: The inferior vena cava is normal in size with greater than 50% respiratory variability, suggesting right atrial pressure of 3 mmHg. IAS/Shunts: No atrial level shunt detected by color flow Doppler.  LEFT VENTRICLE PLAX 2D LVIDd:         5.00 cm     Diastology LVIDs:         3.20 cm     LV e' medial:    9.36 cm/s LV PW:         1.30 cm     LV E/e' medial:  9.9 LV IVS:        1.30 cm     LV e' lateral:  11.50 cm/s LVOT diam:     2.10 cm     LV E/e' lateral: 8.1 LV SV:         74 LV SV Index:   31 LVOT Area:     3.46 cm  LV Volumes (MOD) LV vol d, MOD A2C: 90.2 ml LV vol d, MOD A4C: 94.9 ml LV vol s, MOD A2C: 35.8 ml LV vol s, MOD A4C: 43.0 ml LV SV MOD A2C:     54.4 ml LV SV MOD A4C:     94.9 ml LV SV MOD BP:      57.6 ml RIGHT VENTRICLE RV Basal diam:  3.85 cm RV Mid diam:    3.50 cm RV S prime:     14.90 cm/s TAPSE (M-mode): 2.8 cm LEFT ATRIUM             Index        RIGHT ATRIUM           Index LA diam:        4.20 cm 1.74 cm/m   RA Area:     18.10 cm LA Vol (A2C):   64.4 ml 26.69 ml/m  RA Volume:   52.00 ml  21.55 ml/m LA Vol (A4C):   57.6 ml 23.87 ml/m LA Biplane Vol: 65.1 ml 26.98 ml/m  AORTIC VALVE                    PULMONIC VALVE AV Area (Vmax):    2.97 cm     PV Vmax:       0.60 m/s AV Area (Vmean):   2.59 cm     PV Peak grad:  1.5 mmHg AV Area (VTI):     2.75 cm AV Vmax:           140.00 cm/s AV Vmean:          94.300 cm/s AV VTI:            0.271 m AV Peak Grad:      7.8 mmHg AV Mean Grad:      4.0 mmHg LVOT Vmax:         120.00 cm/s LVOT Vmean:        70.400 cm/s LVOT VTI:          0.215 m LVOT/AV VTI ratio: 0.79  AORTA Ao Root diam: 3.30 cm Ao Asc diam:  2.70 cm MITRAL VALVE               TRICUSPID VALVE MV Area (PHT): 3.87 cm    TR Peak grad:   15.7 mmHg MV Area VTI:   3.59 cm    TR Vmax:        198.00 cm/s MV Peak grad:  4.2  mmHg MV Mean grad:  2.0 mmHg    SHUNTS MV Vmax:       1.02 m/s    Systemic VTI:  0.22 m MV Vmean:      64.4 cm/s   Systemic Diam: 2.10 cm MV Decel Time: 196 msec MV E velocity: 93.00 cm/s MV A velocity: 60.80 cm/s MV E/A ratio:  1.53 Carlyle Dolly MD Electronically signed by Carlyle Dolly MD Signature Date/Time: 04/04/2022/11:03:07 AM    Final    US Carotid Bilateral  Result Date: 04/04/2022 CLINICAL DATA:  Syncope Collapse Former tobacco use EXAM: BILATERAL CAROTID DUPLEX ULTRASOUND TECHNIQUE: Pearline Cables scale imaging, color Doppler and duplex ultrasound were performed of bilateral carotid and vertebral arteries in the neck. COMPARISON:  None available FINDINGS: Criteria: Quantification of carotid stenosis is based on velocity parameters that correlate the residual internal carotid diameter with NASCET-based stenosis levels, using the diameter of the distal internal carotid lumen as the denominator for stenosis measurement. The following velocity measurements were obtained: RIGHT ICA: 70/30 cm/sec CCA: 034/74 cm/sec SYSTOLIC ICA/CCA RATIO:  0.6 ECA: 98 cm/sec LEFT ICA: 117/22 cm/sec CCA: 259/56 cm/sec SYSTOLIC ICA/CCA RATIO:  1.9 ECA: 128 cm/sec RIGHT CAROTID ARTERY: No significant atheromatous plaque. RIGHT VERTEBRAL ARTERY:  Antegrade flow. LEFT CAROTID ARTERY:  No significant atheromatous plaque. LEFT VERTEBRAL ARTERY:  Antegrade flow. IMPRESSION: No significant stenosis of internal carotid arteries. Electronically Signed   By: Miachel Roux M.D.   On: 04/04/2022 10:57   CT Head Wo Contrast  Result Date: 04/03/2022 CLINICAL DATA:  Unresponsive episode EXAM: CT HEAD WITHOUT CONTRAST CT CERVICAL SPINE WITHOUT CONTRAST TECHNIQUE: Multidetector CT imaging of the head and cervical spine was performed following the standard protocol without intravenous contrast. Multiplanar CT image reconstructions of the cervical spine were also generated. RADIATION DOSE REDUCTION: This exam was performed according to the  departmental dose-optimization program which includes automated exposure control, adjustment of the mA and/or kV according to patient size and/or use of iterative reconstruction technique. COMPARISON:  MRI 11/18/2020, CT brain 10/04/2021 FINDINGS: CT HEAD FINDINGS Brain: No acute territorial infarction, hemorrhage or intracranial mass. The ventricles are nonenlarged Vascular: No hyperdense vessels.  No unexpected calcification Skull: Normal. Negative for fracture or focal lesion. Sinuses/Orbits: No acute finding. Other: None CT CERVICAL SPINE FINDINGS Alignment: No subluxation.  Facet alignment within normal limits. Skull base and vertebrae: No acute fracture. No primary bone lesion or focal pathologic process. Soft tissues and spinal canal: No prevertebral fluid or swelling. No visible canal hematoma. Disc levels: Multilevel degenerative change. Mild to moderate diffuse disc space narrowing with multilevel bulky osteophyte. Multilevel bilateral foraminal narrowing. Upper chest: Negative. Other: None IMPRESSION: 1. Negative non contrasted CT appearance of the brain 2. Degenerative changes of the cervical spine. No acute osseous abnormality Electronically Signed   By: Donavan Foil M.D.   On: 04/03/2022 21:06   CT Cervical Spine Wo Contrast  Result Date: 04/03/2022 CLINICAL DATA:  Unresponsive episode EXAM: CT HEAD WITHOUT CONTRAST CT CERVICAL SPINE WITHOUT CONTRAST TECHNIQUE: Multidetector CT imaging of the head and cervical spine was performed following the standard protocol without intravenous contrast. Multiplanar CT image reconstructions of the cervical spine were also generated. RADIATION DOSE REDUCTION: This exam was performed according to the departmental dose-optimization program which includes automated exposure control, adjustment of the mA and/or kV according to patient size and/or use of iterative reconstruction technique. COMPARISON:  MRI 11/18/2020, CT brain 10/04/2021 FINDINGS: CT HEAD FINDINGS  Brain: No acute territorial infarction, hemorrhage or intracranial mass. The ventricles are nonenlarged Vascular: No hyperdense vessels.  No unexpected calcification Skull: Normal. Negative for fracture or focal lesion. Sinuses/Orbits: No acute finding. Other: None CT CERVICAL SPINE FINDINGS Alignment: No subluxation.  Facet alignment within normal limits. Skull base and vertebrae: No acute fracture. No primary bone lesion or focal pathologic process. Soft tissues and spinal canal: No prevertebral fluid or swelling. No visible canal hematoma. Disc levels: Multilevel degenerative change. Mild to moderate diffuse disc space narrowing with multilevel bulky osteophyte. Multilevel bilateral foraminal narrowing. Upper chest: Negative. Other: None IMPRESSION: 1. Negative non contrasted CT appearance of the brain 2. Degenerative changes of the cervical spine. No acute osseous abnormality Electronically Signed   By: Donavan Foil M.D.   On: 04/03/2022  21:06   DG Chest Port 1 View  Result Date: 04/03/2022 CLINICAL DATA:  Unresponsive episode EXAM: PORTABLE CHEST 1 VIEW COMPARISON:  10/06/2018 FINDINGS: Low lung volumes. Heart and mediastinal contours are within normal limits. No focal opacities or effusions. No acute bony abnormality. IMPRESSION: Low volumes.  No active cardiopulmonary disease. Electronically Signed   By: Rolm Baptise M.D.   On: 04/03/2022 20:46    Microbiology: Results for orders placed or performed during the hospital encounter of 04/03/22  Resp Panel by RT-PCR (Flu A&B, Covid) Anterior Nasal Swab     Status: Abnormal   Collection Time: 04/03/22  9:20 PM   Specimen: Anterior Nasal Swab  Result Value Ref Range Status   SARS Coronavirus 2 by RT PCR POSITIVE (A) NEGATIVE Final    Comment: (NOTE) SARS-CoV-2 target nucleic acids are DETECTED.  The SARS-CoV-2 RNA is generally detectable in upper respiratory specimens during the acute phase of infection. Positive results are indicative of the  presence of the identified virus, but do not rule out bacterial infection or co-infection with other pathogens not detected by the test. Clinical correlation with patient history and other diagnostic information is necessary to determine patient infection status. The expected result is Negative.  Fact Sheet for Patients: EntrepreneurPulse.com.au  Fact Sheet for Healthcare Providers: IncredibleEmployment.be  This test is not yet approved or cleared by the [place] FDA and  has been authorized for detection and/or diagnosis of SARS-CoV-2 by FDA under an Emergency Use Authorization (EUA).  This EUA will remain in effect (meaning this test can be used) for the duration of  the COVID-19 declaration under Section 564(b)(1) of the A ct, 21 U.S.C. section 360bbb-3(b)(1), unless the authorization is terminated or revoked sooner.     Influenza A by PCR NEGATIVE NEGATIVE Final   Influenza B by PCR NEGATIVE NEGATIVE Final    Comment: (NOTE) The Xpert Xpress SARS-CoV-2/FLU/RSV plus assay is intended as an aid in the diagnosis of influenza from Nasopharyngeal swab specimens and should not be used as a sole basis for treatment. Nasal washings and aspirates are unacceptable for Xpert Xpress SARS-CoV-2/FLU/RSV testing.  Fact Sheet for Patients: EntrepreneurPulse.com.au  Fact Sheet for Healthcare Providers: IncredibleEmployment.be  This test is not yet approved or cleared by the [place] FDA and has been authorized for detection and/or diagnosis of SARS-CoV-2 by FDA under an Emergency Use Authorization (EUA). This EUA will remain in effect (meaning this test can be used) for the duration of the COVID-19 declaration under Section 564(b)(1) of the Act, 21 U.S.C. section 360bbb-3(b)(1), unless the authorization is terminated or revoked.  Performed at [facility], [address] [place], [place] [address]    Expectorated Sputum Assessment w Gram Stain, Rflx to Resp Cult     Status: None   Collection Time: 04/07/22  8:30 PM   Specimen: Expectorated Sputum  Result Value Ref Range Status   Specimen Description EXPECTORATED SPUTUM  Final   Special Requests NONE  Final   Sputum evaluation   Final    THIS SPECIMEN IS ACCEPTABLE FOR SPUTUM CULTURE Performed at [facility], [address] [place], [place] [address]    Report Status 04/07/2022 FINAL  Final  Culture, Respiratory w Gram Stain     Status: None (Preliminary result)   Collection Time: 04/07/22  8:30 PM  Result Value Ref Range Status   Specimen Description   Final    EXPECTORATED SPUTUM Performed at [facility], [address], [place], [place] [address]  Special Requests   Final    NONE Reflexed from [id number] Performed at [facility], [address], [place], [place] [address]    Gram Stain   Final    NO WBC SEEN ABUNDANT GRAM POSITIVE COCCI IN PAIRS IN CHAINS FEW GRAM NEGATIVE RODS RARE GRAM POSITIVE RODS Performed at [facility], [place] [address] [place], [place] [address]    Culture PENDING  Incomplete   Report Status PENDING  Incomplete    Labs: CBC: Recent Labs  Lab 04/03/22 1948 04/05/22 0450 04/06/22 0653 04/07/22 0546  WBC 4.5 4.6 4.4 4.5  NEUTROABS 3.2 2.7 2.5 2.4  HGB 13.1 14.3 15.0 15.2  HCT 38.2* 42.5 43.9 45.0  MCV 86.2 84.5 85.1 84.7  PLT 140* 136* 142* 984*   Basic Metabolic Panel: Recent Labs  Lab 04/03/22 1948 04/04/22 1046 04/05/22 0450 04/06/22 0653 04/07/22 0546  NA 141 139 137 141 140  K 3.1* 4.4 4.7 4.4 4.2  CL 116* 107 105 104 105  CO2 '22 27 28 29 29  '$ GLUCOSE 90 104* 87 104* 102*  BUN '12 13 14 16 15  '$ CREATININE 1.09 1.31* 1.35* 1.49* 1.40*  CALCIUM 6.7* 8.6* 9.1 9.0 8.8*  MG  --   --   --  2.3 2.2   Liver Function Tests: Recent Labs  Lab 04/03/22 1948  AST 17  ALT 17  ALKPHOS 69  BILITOT 0.7  PROT 5.2*  ALBUMIN 3.3*   CBG: Recent Labs  Lab 04/03/22 1947  04/05/22 2152  GLUCAP 104* 128*    Discharge time spent: {LESS THAN/GREATER KJIZ:12811} 30 minutes.  Signed: Hosie Poisson, MD [facility] 04/09/2022

## 2022-04-09 NOTE — Progress Notes (Addendum)
Patient wants [facility] services. HH PT/OT referral made and accepted by [facility] with Alvis Lemmings. RW ordered via Thedore Mins at [place].    Yola Paradiso, Clydene Pugh, LCSW

## 2022-04-09 NOTE — Care Management Important Message (Signed)
Important Message  Patient Details  Name: Dale Bender MRN: [id number] Date of Birth: [birth year]   Medicare Important Message Given:  Yes (spoke with wife, [name] at [phone] to explain letter, no additional copy needed per wife)     Tommy Medal 04/09/2022, 1:55 PM

## 2022-04-10 LAB — CULTURE, RESPIRATORY W GRAM STAIN
Culture: NORMAL
Gram Stain: NONE SEEN

## 2022-04-10 NOTE — TOC Progression Note (Signed)
Transition of Care [facility]) - Progression Note    Patient Details  Name: Dale Bender MRN: [id number] Date of Birth: [birth year]  Transition of Care [facility]) CM/SW Contact  Shade Flood, LCSW Phone Number: 04/10/2022, 11:59 AM  Clinical Narrative:     TOC received call from pt's wife today stating that she took pt home with [facility] PT arranged yesterday and that pt is not able to ambulate. She is wondering if pt can still admit to SNF rehab. Discussed SNF requiring 10 day quarantine and able to admit on 9/3. Pt's wife states HH PT scheduled to come tomorrow. She states they will see how it goes with [facility] and that she is interested in speaking to [facility] about the possibility of pt admitting there from home on 9/3. TOC asked Bryson Ha at [facility] if someone from the SNF could reach out to pt's wife to discuss options. Bryson Ha stated she would ask Shana at the SNF to call her.   Expected Discharge Plan: [place] Barriers to Discharge: Continued Medical Work up  Expected Discharge Plan and Services Expected Discharge Plan: [place] In-house Referral: Clinical Social Work   Post Acute Care Choice: [place] Living arrangements for the past 2 months: Single Family Home Expected Discharge Date: 04/09/22                                     Social Determinants of Health (SDOH) Interventions    Readmission Risk Interventions     No data to display

## 2022-04-14 DIAGNOSIS — U071 COVID-19: Secondary | ICD-10-CM | POA: Diagnosis not present

## 2022-04-14 DIAGNOSIS — R55 Syncope and collapse: Secondary | ICD-10-CM | POA: Diagnosis not present

## 2022-04-14 DIAGNOSIS — R29898 Other symptoms and signs involving the musculoskeletal system: Secondary | ICD-10-CM | POA: Diagnosis not present

## 2022-04-14 DIAGNOSIS — R569 Unspecified convulsions: Secondary | ICD-10-CM | POA: Diagnosis not present

## 2022-04-14 DIAGNOSIS — M5116 Intervertebral disc disorders with radiculopathy, lumbar region: Secondary | ICD-10-CM | POA: Diagnosis not present

## 2022-04-14 DIAGNOSIS — Z8616 Personal history of COVID-19: Secondary | ICD-10-CM | POA: Diagnosis not present

## 2022-04-14 DIAGNOSIS — G894 Chronic pain syndrome: Secondary | ICD-10-CM | POA: Diagnosis not present

## 2022-04-14 DIAGNOSIS — R5381 Other malaise: Secondary | ICD-10-CM | POA: Diagnosis not present

## 2022-04-14 DIAGNOSIS — K219 Gastro-esophageal reflux disease without esophagitis: Secondary | ICD-10-CM | POA: Diagnosis not present

## 2022-04-14 DIAGNOSIS — E876 Hypokalemia: Secondary | ICD-10-CM | POA: Diagnosis not present

## 2022-04-14 DIAGNOSIS — G8929 Other chronic pain: Secondary | ICD-10-CM | POA: Diagnosis not present

## 2022-04-14 DIAGNOSIS — D696 Thrombocytopenia, unspecified: Secondary | ICD-10-CM | POA: Diagnosis not present

## 2022-04-16 DIAGNOSIS — R5381 Other malaise: Secondary | ICD-10-CM | POA: Diagnosis not present

## 2022-04-16 DIAGNOSIS — R55 Syncope and collapse: Secondary | ICD-10-CM | POA: Diagnosis not present

## 2022-04-16 DIAGNOSIS — U071 COVID-19: Secondary | ICD-10-CM | POA: Diagnosis not present

## 2022-04-19 DIAGNOSIS — K219 Gastro-esophageal reflux disease without esophagitis: Secondary | ICD-10-CM | POA: Diagnosis not present

## 2022-04-19 DIAGNOSIS — R5381 Other malaise: Secondary | ICD-10-CM | POA: Diagnosis not present

## 2022-04-19 DIAGNOSIS — U071 COVID-19: Secondary | ICD-10-CM | POA: Diagnosis not present

## 2022-04-19 DIAGNOSIS — G8929 Other chronic pain: Secondary | ICD-10-CM | POA: Diagnosis not present

## 2022-04-30 DIAGNOSIS — G894 Chronic pain syndrome: Secondary | ICD-10-CM | POA: Diagnosis not present

## 2022-04-30 DIAGNOSIS — M5116 Intervertebral disc disorders with radiculopathy, lumbar region: Secondary | ICD-10-CM | POA: Diagnosis not present

## 2022-05-03 DIAGNOSIS — R55 Syncope and collapse: Secondary | ICD-10-CM | POA: Diagnosis not present

## 2022-05-03 DIAGNOSIS — R5381 Other malaise: Secondary | ICD-10-CM | POA: Diagnosis not present

## 2022-05-03 DIAGNOSIS — U071 COVID-19: Secondary | ICD-10-CM | POA: Diagnosis not present

## 2022-05-03 DIAGNOSIS — G8929 Other chronic pain: Secondary | ICD-10-CM | POA: Diagnosis not present

## 2022-05-03 DIAGNOSIS — K219 Gastro-esophageal reflux disease without esophagitis: Secondary | ICD-10-CM | POA: Diagnosis not present

## 2022-05-09 DIAGNOSIS — Z91199 Patient's noncompliance with other medical treatment and regimen due to unspecified reason: Secondary | ICD-10-CM | POA: Diagnosis not present

## 2022-05-17 DIAGNOSIS — R030 Elevated blood-pressure reading, without diagnosis of hypertension: Secondary | ICD-10-CM | POA: Diagnosis not present

## 2022-05-17 DIAGNOSIS — R269 Unspecified abnormalities of gait and mobility: Secondary | ICD-10-CM | POA: Diagnosis not present

## 2022-05-17 DIAGNOSIS — R29898 Other symptoms and signs involving the musculoskeletal system: Secondary | ICD-10-CM | POA: Diagnosis not present

## 2022-05-17 DIAGNOSIS — R531 Weakness: Secondary | ICD-10-CM | POA: Diagnosis not present

## 2022-05-17 DIAGNOSIS — R1084 Generalized abdominal pain: Secondary | ICD-10-CM | POA: Diagnosis not present

## 2022-05-17 DIAGNOSIS — Z6835 Body mass index (BMI) 35.0-35.9, adult: Secondary | ICD-10-CM | POA: Diagnosis not present

## 2022-05-21 NOTE — Progress Notes (Unsigned)
Virtual Visit via Video Note  I connected with Dale Bender on 05/22/22 at 11:30 AM EDT by a video enabled telemedicine application and verified that I am speaking with the correct person using two identifiers.  Location: Patient: home Provider: office Persons participated in the visit- patient, provider    I discussed the limitations of evaluation and management by telemedicine and the availability of in person appointments. The patient expressed understanding and agreed to proceed.    I discussed the assessment and treatment plan with the patient. The patient was provided an opportunity to ask questions and all were answered. The patient agreed with the plan and demonstrated an understanding of the instructions.   The patient was advised to call back or seek an in-person evaluation if the symptoms worsen or if the condition fails to improve as anticipated.  I provided 12 minutes of non-face-to-face time during this encounter.   [name], MD    [facility] MD/PA/NP OP Progress Note  05/22/2022 11:54 AM Dale Bender  MRN:  [id number]  Chief Complaint:  Chief Complaint  Patient presents with   Follow-up   HPI:  - According to the chart review, He was admitted due to syncope and collapse.  Etiology was unclear, which includes dehydration from COVID infection. "Therapy eval to SNF but pt wanted to go home with home health PT."   This is a follow-up appointment for depression.  He states that he was in the hospital followed by nursing home until a few weeks ago.  Although he did not recognize he was in the hospital, he was brought with his legs shaking.  It is not shaking anymore, and he has been adjusting well coming back home.  He has been able to attend a church.  He reports good connection with others.  The relationship with his wife has been the same.  He feels lonely at times.  He thinks his mood has been good.  He enjoys going out at times.  He sleeps well most of the time.   He has been able to eat well.  He denies SI.  He feels less anxious.  He denies alcohol use, drug use or cigarette use.  He feels comfortable to stay on the Zoloft at the current dose. Although he canceled the appointment with Mr. Eulas Post as he was in the nursing home, he is willing to contact the office to get the follow up appointment.   Daily routine: stays in the house most of the time, goes to church on Wed, Sundays Employment: on long term disability from his job, (currently waiting for decision from the state). He used to works at [employer] for 13 years Support: [place]: wife, his children Marital status: married  Number of children: 42 (76, 35 year old son/daughter)  Visit Diagnosis:    ICD-10-CM   1. MDD (major depressive disorder), recurrent, in partial remission (HCC)  F33.41 sertraline (ZOLOFT) 100 MG tablet      Past Psychiatric History: Please see initial evaluation for full details. I have reviewed the history. No updates at this time.     Past Medical History:  Past Medical History:  Diagnosis Date   Anxiety    Arthritis    Back pain    Depression, major, recurrent, moderate (HCC)    GERD (gastroesophageal reflux disease)    History of seizures    Lower extremity weakness    Prostate nodule    Benign   Sleep apnea    Suicidal  ideation    Umbilical hernia    Urethral stricture in male     Past Surgical History:  Procedure Laterality Date   FRACTURE SURGERY     LT arm, LT leg, Rt leg   PROSTATE BIOPSY     UMBILICAL HERNIA REPAIR N/A 12/03/2019   Procedure: HERNIA REPAIR UMBILICAL ADULT/ WITH MESH;  Surgeon: Aviva Signs, MD;  Location: [facility];  Service: General;  Laterality: N/A;   URETHRAL STRICTURE DILATATION      Family Psychiatric History: Please see initial evaluation for full details. I have reviewed the history. No updates at this time.     Family History:  Family History  Problem Relation Age of Onset   Hypertension Mother    Diabetes  Mother    Other Father        died at age 43 in accident - log fell on him   Sleep apnea Sister    Kidney cancer Maternal Aunt    Prostate cancer Paternal Uncle    Colon cancer Neg Hx    Esophageal cancer Neg Hx    Liver cancer Neg Hx    Pancreatic cancer Neg Hx     Social History:  Social History   Socioeconomic History   Marital status: Married    Spouse name: Not on file   Number of children: 2   Years of education: 10th grade   Highest education level: Not on file  Occupational History   Occupation: Disability  Tobacco Use   Smoking status: Former    Packs/day: 0.50    Years: 10.00    Total pack years: 5.00    Types: Cigarettes    Quit date: 12/01/2002    Years since quitting: 19.4   Smokeless tobacco: Never  Vaping Use   Vaping Use: Never used  Substance and Sexual Activity   Alcohol use: Never   Drug use: Never   Sexual activity: Not on file  Other Topics Concern   Not on file  Social History Narrative   Lives at home with his wife and children.   Right-handed.   2 cups caffeine per day.   Social Determinants of Health   Financial Resource Strain: Not on file  Food Insecurity: Not on file  Transportation Needs: Not on file  Physical Activity: Not on file  Stress: Not on file  Social Connections: Not on file    Allergies: No Known Allergies  Metabolic Disorder Labs: No results found for: "HGBA1C", "MPG" No results found for: "PROLACTIN" Lab Results  Component Value Date   TRIG 187 (H) 08/05/2019   Lab Results  Component Value Date   TSH 1.920 11/02/2020   TSH 2.430 06/10/2019    Therapeutic Level Labs: No results found for: "LITHIUM" No results found for: "VALPROATE" No results found for: "CBMZ"  Current Medications: Current Outpatient Medications  Medication Sig Dispense Refill   albuterol (VENTOLIN HFA) 108 (90 Base) MCG/ACT inhaler Inhale 2 puffs into the lungs every 6 (six) hours as needed for wheezing or shortness of breath. 8 g 0    buprenorphine (BUTRANS) 10 MCG/HR PTWK 1 patch once a week.     guaiFENesin-dextromethorphan (ROBITUSSIN DM) 100-10 MG/5ML syrup Take 5-10 mLs by mouth every 4 (four) hours as needed for cough. 118 mL 0   LamoTRIgine 200 MG TB24 24 hour tablet TAKE TWO TABLETS BY MOUTH AT BEDTIME 180 tablet 4   omeprazole (PRILOSEC) 40 MG capsule Take 40 mg by mouth daily.     [START  ON 06/12/2022] sertraline (ZOLOFT) 100 MG tablet Take 2 tablets (200 mg total) by mouth daily. 180 tablet 0   ZTLIDO 1.8 % PTCH Apply 1 patch topically 3 (three) times daily.     No current facility-administered medications for this visit.     Musculoskeletal: Strength & Muscle Tone:  N/A Gait & Station:  N/A Patient leans: N/A  Psychiatric Specialty Exam: Review of Systems  Psychiatric/Behavioral:  Positive for dysphoric mood and sleep disturbance. Negative for agitation, behavioral problems, confusion, decreased concentration, hallucinations, self-injury and suicidal ideas. The patient is not nervous/anxious and is not hyperactive.   All other systems reviewed and are negative.   There were no vitals taken for this visit.There is no height or weight on file to calculate BMI.  General Appearance: Fairly Groomed  Eye Contact:  Good  Speech:  Clear and Coherent  Volume:  Normal  Mood:   good  Affect:  Appropriate, Congruent, and calm  Thought Process:  Coherent  Orientation:  Full (Time, Place, and Person)  Thought Content: Logical   Suicidal Thoughts:  No  Homicidal Thoughts:  No  Memory:  Immediate;   Good  Judgement:  Good  Insight:  Good  Psychomotor Activity:  Normal  Concentration:  Concentration: Good and Attention Span: Good  Recall:  Good  Fund of Knowledge: Good  Language: Good  Akathisia:  No  Handed:  Right  AIMS (if indicated): not done  Assets:  Communication Skills Desire for Improvement  ADL's:  Intact  Cognition: WNL  Sleep:  Fair   Screenings: GAD-7    Health and safety inspector from  12/06/2021 in [facility] [facility]  Total GAD-7 Score 18      PHQ2-9    [place] Office Visit from 01/01/2022 in [place] from 12/06/2021 in [place] [facility] Video Visit from 08/30/2021 in [place] Video Visit from 06/01/2021 in [place] Video Visit from 04/05/2021 in [place]  PHQ-2 Total Score 0 '4 4 2 5  '$ PHQ-9 Total Score '2 20 10 5 13      '$ Flowsheet Row ED to Hosp-Admission (Discharged) from 04/03/2022 in [place] Office Visit from 01/01/2022 in [place] Counselor from 12/06/2021 in [place] No Risk Low Risk No Risk        Assessment and Plan:  SANTANNA OLENIK is a 51 y.o. year old male with a history of depression, complex partial seizure, obstructive sleep apnea on CPAP, who presents for follow up appointment for below.   1. MDD (major depressive disorder), recurrent, in partial remission ([place]) There has been overall improvement in depressive symptoms since the last visit.  Recent psychosocial stressors includes admission in the context of COVID.  Other psychosocial stressors includes marital conflict, demoralization due to his health condition including seizure, visual change, right leg weakness, and unemployment. He had a suicide attempt of jumping from a moving car and overdose of muscle relaxants, which led to admission to [facility] in March.  Will continue current dose of sertraline to target depression.  Noted that he does not want to try any medication which can potentially cause weight gain.  He was advised again to contact the clinic to have a follow-up appointment with his therapist.   Plan Continue sertraline 200 mg at night   Front desk  to contact to schedule follow up appointment  with Mr. Eulas Post for therapy Next appointment- 11/29 at 8 am, video- declined in person visit due to lack of transportation   - Reviewed TSH ; wnl   Past trials of medication: sertraline, Abilify (irritability, nausea)   I have reviewed suicide assessment in detail. No change in the following assessment.    The patient demonstrates the following risk factors for suicide: Chronic risk factors for suicide include: psychiatric disorder of depression. Acute risk factors for suicide include: recent suicide attempt, loss (financial, interpersonal, professional). Protective factors for this patient include: positive social support, responsibility to others (children, family), coping skills and hope for the future. Considering these factors, the overall suicide risk at this point appears to be moderate, but not at imminent risk. Patient is appropriate for outpatient follow up. Emergency resources which includes 911, ED, suicide crisis line [phone]) are discussed.      Collaboration of Care: Collaboration of Care: Other reviewed notes in Epic  Patient/Guardian was advised Release of Information must be obtained prior to any record release in order to collaborate their care with an outside provider. Patient/Guardian was advised if they have not already done so to contact the registration department to sign all necessary forms in order for [name] to release information regarding their care.   Consent: Patient/Guardian gives verbal consent for treatment and assignment of benefits for services provided during this visit. Patient/Guardian expressed understanding and agreed to proceed.    [name], MD 05/22/2022, 11:54 AM

## 2022-05-22 ENCOUNTER — Telehealth (INDEPENDENT_AMBULATORY_CARE_PROVIDER_SITE_OTHER): Payer: Medicare HMO | Admitting: Psychiatry

## 2022-05-22 ENCOUNTER — Encounter: Payer: Self-pay | Admitting: Psychiatry

## 2022-05-22 DIAGNOSIS — F3341 Major depressive disorder, recurrent, in partial remission: Secondary | ICD-10-CM | POA: Diagnosis not present

## 2022-05-22 MED ORDER — SERTRALINE HCL 100 MG PO TABS: 200.0000 mg | ORAL_TABLET | Freq: Every day | ORAL | 0 refills | Status: DC

## 2022-05-28 DIAGNOSIS — Z79891 Long term (current) use of opiate analgesic: Secondary | ICD-10-CM | POA: Diagnosis not present

## 2022-05-28 DIAGNOSIS — M5116 Intervertebral disc disorders with radiculopathy, lumbar region: Secondary | ICD-10-CM | POA: Diagnosis not present

## 2022-05-28 DIAGNOSIS — Z79899 Other long term (current) drug therapy: Secondary | ICD-10-CM | POA: Diagnosis not present

## 2022-05-28 DIAGNOSIS — G894 Chronic pain syndrome: Secondary | ICD-10-CM | POA: Diagnosis not present

## 2022-05-28 DIAGNOSIS — M549 Dorsalgia, unspecified: Secondary | ICD-10-CM | POA: Diagnosis not present

## 2022-06-17 DIAGNOSIS — R1084 Generalized abdominal pain: Secondary | ICD-10-CM | POA: Diagnosis not present

## 2022-06-17 DIAGNOSIS — M7591 Shoulder lesion, unspecified, right shoulder: Secondary | ICD-10-CM | POA: Diagnosis not present

## 2022-06-17 DIAGNOSIS — Z6835 Body mass index (BMI) 35.0-35.9, adult: Secondary | ICD-10-CM | POA: Diagnosis not present

## 2022-06-17 DIAGNOSIS — R29898 Other symptoms and signs involving the musculoskeletal system: Secondary | ICD-10-CM | POA: Diagnosis not present

## 2022-06-17 DIAGNOSIS — R030 Elevated blood-pressure reading, without diagnosis of hypertension: Secondary | ICD-10-CM | POA: Diagnosis not present

## 2022-06-17 DIAGNOSIS — R269 Unspecified abnormalities of gait and mobility: Secondary | ICD-10-CM | POA: Diagnosis not present

## 2022-06-17 DIAGNOSIS — N1831 Chronic kidney disease, stage 3a: Secondary | ICD-10-CM | POA: Diagnosis not present

## 2022-06-18 ENCOUNTER — Ambulatory Visit: Payer: Medicare HMO | Admitting: Neurology

## 2022-06-25 DIAGNOSIS — G894 Chronic pain syndrome: Secondary | ICD-10-CM | POA: Diagnosis not present

## 2022-06-25 DIAGNOSIS — M5116 Intervertebral disc disorders with radiculopathy, lumbar region: Secondary | ICD-10-CM | POA: Diagnosis not present

## 2022-06-25 DIAGNOSIS — M549 Dorsalgia, unspecified: Secondary | ICD-10-CM | POA: Diagnosis not present

## 2022-07-08 NOTE — Progress Notes (Unsigned)
Virtual Visit via Video Note  I connected with Dale Bender on 07/10/22 at  8:00 AM EST by a video enabled telemedicine application and verified that I am speaking with the correct person using two identifiers.  Location: Patient: home Provider: office Persons participated in the visit- patient, provider    I discussed the limitations of evaluation and management by telemedicine and the availability of in person appointments. The patient expressed understanding and agreed to proceed.      I discussed the assessment and treatment plan with the patient. The patient was provided an opportunity to ask questions and all were answered. The patient agreed with the plan and demonstrated an understanding of the instructions.   The patient was advised to call back or seek an in-person evaluation if the symptoms worsen or if the condition fails to improve as anticipated.  I provided 10 minutes of non-face-to-face time during this encounter.   Dale Clay, MD    [name] MD/PA/NP OP Progress Note  07/10/2022 8:20 AM [name]  MRN:  [id number]  Chief Complaint:  Chief Complaint  Patient presents with   Depression   HPI:  This is a follow-up appointment for depression.  He states that he has been doing well.  He is able to walk now without any fall or dizziness.  He had a good Thanksgiving at his cousin's house.  He is doing good in church.  He is working on [employer] for his daughter.  Although he denies feeling depressed, he states that he has been "20:80." He states that he is good 20%. He was told by his wife that he has been irritable.  Although he is hoping to come off sertraline, he agrees to stay on the medication at this time.  He sleeps well.  He has good appetite.  He denies SI.  He denies alcohol use or drug use.    Daily routine: stays in the house most of the time, goes to church on Wed, Sundays Employment: on long term disability from his job, (currently waiting  for decision from the state). He used to works at [employer] for 13 years Support: [place]: wife, his children Marital status: married  Number of children: 20 (9, 52 year old son/daughter)  Visit Diagnosis:    ICD-10-CM   1. MDD (major depressive disorder), recurrent, in partial remission ([place])  F33.41       Past Psychiatric History: Please see initial evaluation for full details. I have reviewed the history. No updates at this time.     Past Medical History:  Past Medical History:  Diagnosis Date   Anxiety    Arthritis    Back pain    Depression, major, recurrent, moderate (HCC)    GERD (gastroesophageal reflux disease)    History of seizures    Lower extremity weakness    Prostate nodule    Benign   Sleep apnea    Suicidal ideation    Umbilical hernia    Urethral stricture in male     Past Surgical History:  Procedure Laterality Date   FRACTURE SURGERY     LT arm, LT leg, Rt leg   PROSTATE BIOPSY     UMBILICAL HERNIA REPAIR N/A 12/03/2019   Procedure: HERNIA REPAIR UMBILICAL ADULT/ WITH MESH;  Surgeon: Aviva Signs, MD;  Location: [facility];  Service: General;  Laterality: N/A;   URETHRAL STRICTURE DILATATION      Family Psychiatric History: Please see initial evaluation for full details.  I have reviewed the history. No updates at this time.     Family History:  Family History  Problem Relation Age of Onset   Hypertension Mother    Diabetes Mother    Other Father        died at age 41 in accident - log fell on him   Sleep apnea Sister    Kidney cancer Maternal Aunt    Prostate cancer Paternal Uncle    Colon cancer Neg Hx    Esophageal cancer Neg Hx    Liver cancer Neg Hx    Pancreatic cancer Neg Hx     Social History:  Social History   Socioeconomic History   Marital status: Married    Spouse name: Not on file   Number of children: 2   Years of education: 10th grade   Highest education level: Not on file  Occupational History   Occupation:  Disability  Tobacco Use   Smoking status: Former    Packs/day: 0.50    Years: 10.00    Total pack years: 5.00    Types: Cigarettes    Quit date: 12/01/2002    Years since quitting: 19.6   Smokeless tobacco: Never  Vaping Use   Vaping Use: Never used  Substance and Sexual Activity   Alcohol use: Never   Drug use: Never   Sexual activity: Not on file  Other Topics Concern   Not on file  Social History Narrative   Lives at home with his wife and children.   Right-handed.   2 cups caffeine per day.   Social Determinants of Health   Financial Resource Strain: Not on file  Food Insecurity: Not on file  Transportation Needs: Not on file  Physical Activity: Not on file  Stress: Not on file  Social Connections: Not on file    Allergies: No Known Allergies  Metabolic Disorder Labs: No results found for: "HGBA1C", "MPG" No results found for: "PROLACTIN" Lab Results  Component Value Date   TRIG 187 (H) 08/05/2019   Lab Results  Component Value Date   TSH 1.920 11/02/2020   TSH 2.430 06/10/2019    Therapeutic Level Labs: No results found for: "LITHIUM" No results found for: "VALPROATE" No results found for: "CBMZ"  Current Medications: Current Outpatient Medications  Medication Sig Dispense Refill   albuterol (VENTOLIN HFA) 108 (90 Base) MCG/ACT inhaler Inhale 2 puffs into the lungs every 6 (six) hours as needed for wheezing or shortness of breath. 8 g 0   buprenorphine (BUTRANS) 10 MCG/HR PTWK 1 patch once a week.     guaiFENesin-dextromethorphan (ROBITUSSIN DM) 100-10 MG/5ML syrup Take 5-10 mLs by mouth every 4 (four) hours as needed for cough. 118 mL 0   LamoTRIgine 200 MG TB24 24 hour tablet TAKE TWO TABLETS BY MOUTH AT BEDTIME 180 tablet 4   omeprazole (PRILOSEC) 40 MG capsule Take 40 mg by mouth daily.     sertraline (ZOLOFT) 100 MG tablet Take 2 tablets (200 mg total) by mouth daily. 180 tablet 0   ZTLIDO 1.8 % PTCH Apply 1 patch topically 3 (three) times daily.      No current facility-administered medications for this visit.     Musculoskeletal: Strength & Muscle Tone: within normal limits Gait & Station: normal Patient leans: N/A  Psychiatric Specialty Exam: Review of Systems  Psychiatric/Behavioral:  Negative for agitation, behavioral problems, confusion, decreased concentration, dysphoric mood, hallucinations, self-injury, sleep disturbance and suicidal ideas. The patient is not nervous/anxious and is not  hyperactive.   All other systems reviewed and are negative.   There were no vitals taken for this visit.There is no height or weight on file to calculate BMI.  General Appearance: Fairly Groomed  Eye Contact:  Good  Speech:  Clear and Coherent  Volume:  Normal  Mood:   good  Affect:  Appropriate, Congruent, and calm  Thought Process:  Coherent  Orientation:  Full (Time, Place, and Person)  Thought Content: Logical   Suicidal Thoughts:  No  Homicidal Thoughts:  No  Memory:  Immediate;   Good  Judgement:  Good  Insight:  Good  Psychomotor Activity:  Normal  Concentration:  Concentration: Good and Attention Span: Good  Recall:  Good  Fund of Knowledge: Good  Language: Good  Akathisia:  No  Handed:  Right  AIMS (if indicated): not done  Assets:  Communication Skills Desire for Improvement  ADL's:  Intact  Cognition: WNL  Sleep:  Good   Screenings: GAD-7    Flowsheet Row Counselor from 12/06/2021 in [place] [facility]  Total GAD-7 Score 18      PHQ2-9    [place] Office Visit from 01/01/2022 in [place] from 12/06/2021 in [place] [facility] Video Visit from 08/30/2021 in [place] Video Visit from 06/01/2021 in [place] Video Visit from 04/05/2021 in [place]  PHQ-2 Total Score 0 '4 4 2 5  '$ PHQ-9 Total Score '2 20  10 5 13      '$ Flowsheet Row ED to Hosp-Admission (Discharged) from 04/03/2022 in [place] Office Visit from 01/01/2022 in [place] Counselor from 12/06/2021 in [place] No Risk Low Risk No Risk        Assessment and Plan:  CAINE BARFIELD is a 51 y.o. year old male with a history of depression, complex partial seizure, obstructive sleep apnea on CPAP, who presents for follow up appointment for below.   1. MDD (major depressive disorder), recurrent, in partial remission ([place]) There has been steady improvement in depressive symptoms, although he continues to experience mild irritability since the last visit. Recent psychosocial stressors includes admission in the context of COVID.  Other psychosocial stressors includes marital conflict, demoralization due to his health condition including seizure, visual change, right leg weakness, and unemployment. He had a suicide attempt of jumping from a moving car and overdose of muscle relaxants, which led to admission to [facility] in March 2023.  He verbalized understanding to continue sertraline at the current dose as maintenance treatment for depression.  He was advised again to contact [place] clinic for follow up with his therapist.   Plan Continue sertraline 200 mg at night   He was advised to have follow up appointment with Mr. Eulas Post for therapy [facility] office was notified at least twice) Next appointment- 2/21 at 8:20, video- declined in person visit due to lack of transportation - on buprenorphine patch   - Reviewed TSH ; wnl   Past trials of medication: sertraline, Abilify (irritability, nausea)   The patient demonstrates the following risk factors for suicide: Chronic risk factors for suicide include: psychiatric disorder of depression. Acute risk factors for suicide include: recent suicide attempt, loss  (financial, interpersonal, professional). Protective factors for this patient include: positive social support, responsibility to others (children, family), coping skills and hope for the future. Considering these factors, the overall  suicide risk at this point appears to be moderate, but not at imminent risk. Patient is appropriate for outpatient follow up. Emergency resources which includes 911, ED, suicide crisis line [phone]) are discussed.         Collaboration of Care: Collaboration of Care: Other reviewed notes in Epic  Patient/Guardian was advised Release of Information must be obtained prior to any record release in order to collaborate their care with an outside provider. Patient/Guardian was advised if they have not already done so to contact the registration department to sign all necessary forms in order for [name] to release information regarding their care.   Consent: Patient/Guardian gives verbal consent for treatment and assignment of benefits for services provided during this visit. Patient/Guardian expressed understanding and agreed to proceed.    Dale Clay, MD 07/10/2022, 8:20 AM

## 2022-07-10 ENCOUNTER — Telehealth (INDEPENDENT_AMBULATORY_CARE_PROVIDER_SITE_OTHER): Payer: Medicare HMO | Admitting: Psychiatry

## 2022-07-10 ENCOUNTER — Encounter: Payer: Self-pay | Admitting: Psychiatry

## 2022-07-10 DIAGNOSIS — F3341 Major depressive disorder, recurrent, in partial remission: Secondary | ICD-10-CM | POA: Diagnosis not present

## 2022-07-25 ENCOUNTER — Ambulatory Visit: Payer: Medicare HMO | Admitting: Neurology

## 2022-08-02 DIAGNOSIS — D489 Neoplasm of uncertain behavior, unspecified: Secondary | ICD-10-CM | POA: Diagnosis not present

## 2022-08-02 DIAGNOSIS — L905 Scar conditions and fibrosis of skin: Secondary | ICD-10-CM | POA: Diagnosis not present

## 2022-08-02 DIAGNOSIS — Z6835 Body mass index (BMI) 35.0-35.9, adult: Secondary | ICD-10-CM | POA: Diagnosis not present

## 2022-08-02 DIAGNOSIS — L989 Disorder of the skin and subcutaneous tissue, unspecified: Secondary | ICD-10-CM | POA: Diagnosis not present

## 2022-08-02 DIAGNOSIS — L988 Other specified disorders of the skin and subcutaneous tissue: Secondary | ICD-10-CM | POA: Diagnosis not present

## 2022-08-02 DIAGNOSIS — R030 Elevated blood-pressure reading, without diagnosis of hypertension: Secondary | ICD-10-CM | POA: Diagnosis not present

## 2022-08-30 ENCOUNTER — Ambulatory Visit: Payer: Medicare HMO | Admitting: Internal Medicine

## 2022-09-01 ENCOUNTER — Other Ambulatory Visit: Payer: Self-pay | Admitting: Psychiatry

## 2022-09-01 DIAGNOSIS — F3341 Major depressive disorder, recurrent, in partial remission: Secondary | ICD-10-CM

## 2022-09-12 ENCOUNTER — Other Ambulatory Visit: Payer: Self-pay | Admitting: Psychiatry

## 2022-09-20 DIAGNOSIS — R109 Unspecified abdominal pain: Secondary | ICD-10-CM | POA: Diagnosis not present

## 2022-09-20 DIAGNOSIS — J45909 Unspecified asthma, uncomplicated: Secondary | ICD-10-CM | POA: Diagnosis not present

## 2022-09-20 DIAGNOSIS — F331 Major depressive disorder, recurrent, moderate: Secondary | ICD-10-CM | POA: Diagnosis not present

## 2022-09-20 DIAGNOSIS — Z6834 Body mass index (BMI) 34.0-34.9, adult: Secondary | ICD-10-CM | POA: Diagnosis not present

## 2022-09-20 DIAGNOSIS — R030 Elevated blood-pressure reading, without diagnosis of hypertension: Secondary | ICD-10-CM | POA: Diagnosis not present

## 2022-09-20 DIAGNOSIS — M5116 Intervertebral disc disorders with radiculopathy, lumbar region: Secondary | ICD-10-CM | POA: Diagnosis not present

## 2022-09-29 NOTE — Progress Notes (Unsigned)
Virtual Visit via Video Note  I connected with Dale Bender on 10/02/22 at  8:20 AM EST by a video enabled telemedicine application and verified that I am speaking with the correct person using two identifiers.  Location: Patient: home Provider: office Persons participated in the visit- patient, provider    I discussed the limitations of evaluation and management by telemedicine and the availability of in person appointments. The patient expressed understanding and agreed to proceed.     I discussed the assessment and treatment plan with the patient. The patient was provided an opportunity to ask questions and all were answered. The patient agreed with the plan and demonstrated an understanding of the instructions.   The patient was advised to call back or seek an in-person evaluation if the symptoms worsen or if the condition fails to improve as anticipated.  I provided 10 minutes of non-face-to-face time during this encounter.   [name], MD    [facility] MD/PA/NP OP Progress Note  10/02/2022 8:39 AM Dale Bender  MRN:  [id number]  Chief Complaint:  Chief Complaint  Patient presents with   Follow-up   HPI:  This is a follow-up appointment for depression.  He states that he has been doing well.  He had a fine Christmas.  It did not go as he expected on Valentine's Day.  Although he wanted to bring to a restaurant his wife wanted, they could not afford this.  He states that he has good days for a few months, and may have a little bad day for a month.  However, he thinks he has been better.  He would notify his family if he is not feeling good.  He tends to stay at home on those days.  When he feels good, he plays with his family.  He goes to church regularly and is actively involved.  He loves serving the lord.  He has occasional insomnia when CPAP machine does not fit.  He denies change in appetite.  He denies SI.  He would like to stay on the medication as it is rather than  making an adjustment.    Daily routine: stays in the house most of the time, goes to church on Wed, Sundays Employment: on long term disability from his job, (currently waiting for decision from the state). He used to works at [employer] for 13 years Support: [place]: wife, his children Marital status: married  Number of children: 25 (71, 73 year old son/daughter)  Visit Diagnosis:    ICD-10-CM   1. MDD (major depressive disorder), recurrent, in partial remission (HCC)  F33.41 sertraline (ZOLOFT) 100 MG tablet      Past Psychiatric History: Please see initial evaluation for full details. I have reviewed the history. No updates at this time.     Past Medical History:  Past Medical History:  Diagnosis Date   Anxiety    Arthritis    Back pain    Depression, major, recurrent, moderate (HCC)    GERD (gastroesophageal reflux disease)    History of seizures    Lower extremity weakness    Prostate nodule    Benign   Sleep apnea    Suicidal ideation    Umbilical hernia    Urethral stricture in male     Past Surgical History:  Procedure Laterality Date   FRACTURE SURGERY     LT arm, LT leg, Rt leg   PROSTATE BIOPSY     UMBILICAL HERNIA REPAIR N/A 12/03/2019  Procedure: HERNIA REPAIR UMBILICAL ADULT/ WITH MESH;  Surgeon: Aviva Signs, MD;  Location: [facility];  Service: General;  Laterality: N/A;   URETHRAL STRICTURE DILATATION      Family Psychiatric History: Please see initial evaluation for full details. I have reviewed the history. No updates at this time.     Family History:  Family History  Problem Relation Age of Onset   Hypertension Mother    Diabetes Mother    Other Father        died at age 46 in accident - log fell on him   Sleep apnea Sister    Kidney cancer Maternal Aunt    Prostate cancer Paternal Uncle    Colon cancer Neg Hx    Esophageal cancer Neg Hx    Liver cancer Neg Hx    Pancreatic cancer Neg Hx     Social History:  Social History    Socioeconomic History   Marital status: Married    Spouse name: Not on file   Number of children: 2   Years of education: 10th grade   Highest education level: Not on file  Occupational History   Occupation: Disability  Tobacco Use   Smoking status: Former    Packs/day: 0.50    Years: 10.00    Total pack years: 5.00    Types: Cigarettes    Quit date: 12/01/2002    Years since quitting: 19.8   Smokeless tobacco: Never  Vaping Use   Vaping Use: Never used  Substance and Sexual Activity   Alcohol use: Never   Drug use: Never   Sexual activity: Not on file  Other Topics Concern   Not on file  Social History Narrative   Lives at home with his wife and children.   Right-handed.   2 cups caffeine per day.   Social Determinants of Health   Financial Resource Strain: Not on file  Food Insecurity: Not on file  Transportation Needs: Not on file  Physical Activity: Not on file  Stress: Not on file  Social Connections: Not on file    Allergies: No Known Allergies  Metabolic Disorder Labs: No results found for: "HGBA1C", "MPG" No results found for: "PROLACTIN" Lab Results  Component Value Date   TRIG 187 (H) 08/05/2019   Lab Results  Component Value Date   TSH 1.920 11/02/2020   TSH 2.430 06/10/2019    Therapeutic Level Labs: No results found for: "LITHIUM" No results found for: "VALPROATE" No results found for: "CBMZ"  Current Medications: Current Outpatient Medications  Medication Sig Dispense Refill   albuterol (VENTOLIN HFA) 108 (90 Base) MCG/ACT inhaler Inhale 2 puffs into the lungs every 6 (six) hours as needed for wheezing or shortness of breath. 8 g 0   buprenorphine (BUTRANS) 10 MCG/HR PTWK 1 patch once a week.     guaiFENesin-dextromethorphan (ROBITUSSIN DM) 100-10 MG/5ML syrup Take 5-10 mLs by mouth every 4 (four) hours as needed for cough. 118 mL 0   LamoTRIgine 200 MG TB24 24 hour tablet TAKE TWO TABLETS BY MOUTH AT BEDTIME 180 tablet 4    omeprazole (PRILOSEC) 40 MG capsule Take 40 mg by mouth daily.     [START ON 12/09/2022] sertraline (ZOLOFT) 100 MG tablet Take 2 tablets (200 mg total) by mouth daily. 180 tablet 0   ZTLIDO 1.8 % PTCH Apply 1 patch topically 3 (three) times daily.     No current facility-administered medications for this visit.     Musculoskeletal: Strength & Muscle Tone:  N/A Gait & Station:  N/A Patient leans: N/A  Psychiatric Specialty Exam: Review of Systems  Psychiatric/Behavioral:  Positive for dysphoric mood and sleep disturbance. Negative for agitation, behavioral problems, confusion, decreased concentration, hallucinations, self-injury and suicidal ideas. The patient is not nervous/anxious and is not hyperactive.   All other systems reviewed and are negative.   There were no vitals taken for this visit.There is no height or weight on file to calculate BMI.  General Appearance: Fairly Groomed  Eye Contact:  Good  Speech:  Clear and Coherent  Volume:  Normal  Mood:   good  Affect:  Appropriate, Congruent, and Full Range  Thought Process:  Coherent  Orientation:  Full (Time, Place, and Person)  Thought Content: Logical   Suicidal Thoughts:  No  Homicidal Thoughts:  No  Memory:  Immediate;   Good  Judgement:  Good  Insight:  Good  Psychomotor Activity:  Normal  Concentration:  Concentration: Good and Attention Span: Good  Recall:  Good  Fund of Knowledge: Good  Language: Good  Akathisia:  No  Handed:  Right  AIMS (if indicated): not done  Assets:  Communication Skills Desire for Improvement  ADL's:  Intact  Cognition: WNL  Sleep:  Fair   Screenings: GAD-7    Health and safety inspector from 12/06/2021 in [facility] at [place]  Total GAD-7 Score 18      PHQ2-9    [place] Office Visit from 01/01/2022 in [place] from 12/06/2021 in [place] at [place]  Video Visit from 08/30/2021 in [place] Video Visit from 06/01/2021 in [place] Video Visit from 04/05/2021 in [place]  PHQ-2 Total Score 0 4 4 2 5  $ PHQ-9 Total Score 2 20 10 5 13      $ Flowsheet Row ED to Hosp-Admission (Discharged) from 04/03/2022 in [place] Office Visit from 01/01/2022 in [place] Counselor from 12/06/2021 in [place] at [place] No Risk Low Risk No Risk        Assessment and Plan:  Dale Bender is a 52 y.o. year old male with a history of depression, complex partial seizure, obstructive sleep apnea on CPAP, who presents for follow up appointment for below.   1. MDD (major depressive disorder), recurrent, in partial remission ([place]) Acute stressors include:  Other stressors include:  seizure disorder, marital conflict, right leg weakness   History:  admitted to [facility] in March 2023 after SA of jumping from a moving car/overdosing of muscle relaxants There has been a steady improvement in depressive symptoms since the last visit.  He is actively involved in church activities.  Although he is advised to consider adjustment of his medication to optimize treatment, he has strong preference to stay on the current medication regimen.  Will continue sertraline to target depression.  He is willing to try therapy if his insurance is covered.  Will plan to send a message to the front desk regarding this.    Plan Continue sertraline 200 mg at night   Referral to therapy at [place] (if his insurance is accepted) Next appointment- 5/17 at 11:30, video - on buprenorphine patch    - Reviewed TSH ; wnl   Past trials of medication: sertraline, Abilify (irritability, nausea)   The patient demonstrates the following risk  factors for suicide: Chronic risk factors for suicide include: psychiatric disorder of depression. Acute risk factors for suicide include: recent suicide attempt, loss (financial, interpersonal, professional). Protective factors for this patient include: positive social support, responsibility to others (children, family), coping skills and hope for the future. Considering these factors, the overall suicide risk at this point appears to be moderate, but not at imminent risk. Patient is appropriate for outpatient follow up. Emergency resources which includes 911, ED, suicide crisis line [phone]) are discussed.          Collaboration of Care: Collaboration of Care: Other reviewed notes in Epic  Patient/Guardian was advised Release of Information must be obtained prior to any record release in order to collaborate their care with an outside provider. Patient/Guardian was advised if they have not already done so to contact the registration department to sign all necessary forms in order for [name] to release information regarding their care.   Consent: Patient/Guardian gives verbal consent for treatment and assignment of benefits for services provided during this visit. Patient/Guardian expressed understanding and agreed to proceed.    [name], MD 10/02/2022, 8:39 AM

## 2022-10-02 ENCOUNTER — Encounter: Payer: Self-pay | Admitting: Psychiatry

## 2022-10-02 ENCOUNTER — Telehealth (HOSPITAL_COMMUNITY): Payer: Self-pay | Admitting: *Deleted

## 2022-10-02 ENCOUNTER — Telehealth (INDEPENDENT_AMBULATORY_CARE_PROVIDER_SITE_OTHER): Payer: Medicare HMO | Admitting: Psychiatry

## 2022-10-02 DIAGNOSIS — F3341 Major depressive disorder, recurrent, in partial remission: Secondary | ICD-10-CM | POA: Diagnosis not present

## 2022-10-02 MED ORDER — SERTRALINE HCL 100 MG PO TABS: 200.0000 mg | ORAL_TABLET | Freq: Every day | ORAL | 0 refills | Status: DC

## 2022-10-02 NOTE — Patient Instructions (Addendum)
Continue sertraline 200 mg at night   Referral to therapy at [facility] Next appointment- 5/17 at 11:30

## 2022-10-02 NOTE — Telephone Encounter (Signed)
Called patient to schedule Therapy appt due to Dr Modesta Messing request. A male picked up stating she was his wife. Staff asked male to please have patient call office to schedule an appt and male agreed to have patient call back.

## 2022-11-12 ENCOUNTER — Other Ambulatory Visit: Payer: Self-pay | Admitting: Neurology

## 2022-11-12 ENCOUNTER — Telehealth: Payer: Self-pay | Admitting: Neurology

## 2022-11-12 NOTE — Telephone Encounter (Signed)
Pt is scheduled to see Judson Roch for soonest appointment available which is 12/09/22 at [place]

## 2022-11-12 NOTE — Telephone Encounter (Signed)
Pt's wife would like a call to discuss some questions she has about the pt's LamoTRIgine 200 MG TB24 24 hour tablet [id number] medication

## 2022-12-06 DIAGNOSIS — K219 Gastro-esophageal reflux disease without esophagitis: Secondary | ICD-10-CM | POA: Diagnosis not present

## 2022-12-06 DIAGNOSIS — R5383 Other fatigue: Secondary | ICD-10-CM | POA: Diagnosis not present

## 2022-12-06 DIAGNOSIS — R030 Elevated blood-pressure reading, without diagnosis of hypertension: Secondary | ICD-10-CM | POA: Diagnosis not present

## 2022-12-06 DIAGNOSIS — E875 Hyperkalemia: Secondary | ICD-10-CM | POA: Diagnosis not present

## 2022-12-06 DIAGNOSIS — N1831 Chronic kidney disease, stage 3a: Secondary | ICD-10-CM | POA: Diagnosis not present

## 2022-12-06 DIAGNOSIS — E7801 Familial hypercholesterolemia: Secondary | ICD-10-CM | POA: Diagnosis not present

## 2022-12-09 ENCOUNTER — Telehealth (INDEPENDENT_AMBULATORY_CARE_PROVIDER_SITE_OTHER): Payer: Medicare HMO | Admitting: Neurology

## 2022-12-09 ENCOUNTER — Telehealth: Payer: Self-pay

## 2022-12-09 DIAGNOSIS — H3540 Unspecified peripheral retinal degeneration: Secondary | ICD-10-CM | POA: Diagnosis not present

## 2022-12-09 DIAGNOSIS — G40909 Epilepsy, unspecified, not intractable, without status epilepticus: Secondary | ICD-10-CM

## 2022-12-09 DIAGNOSIS — H5213 Myopia, bilateral: Secondary | ICD-10-CM | POA: Diagnosis not present

## 2022-12-09 DIAGNOSIS — G4733 Obstructive sleep apnea (adult) (pediatric): Secondary | ICD-10-CM

## 2022-12-09 DIAGNOSIS — F445 Conversion disorder with seizures or convulsions: Secondary | ICD-10-CM

## 2022-12-09 MED ORDER — LAMOTRIGINE ER 200 MG PO TB24
2.0000 | ORAL_TABLET | Freq: Every day | ORAL | 1 refills | Status: DC
Start: 2022-12-09 — End: 2023-06-24

## 2022-12-09 NOTE — Telephone Encounter (Signed)
Bipap order community msg sent thru epic

## 2022-12-09 NOTE — Progress Notes (Signed)
Virtual Visit via Video Note  I connected with Dale Bender on 12/09/22 at  9:00 AM EDT by a video enabled telemedicine application and verified that I am speaking with the correct person using two identifiers.  Location: Patient: at his home Provider: at my home    I discussed the limitations of evaluation and management by telemedicine and the availability of in person appointments. The patient expressed understanding and agreed to proceed.  History of Present Illness: Today, December 09, 2022 SS: BiPAP download is attached below, overall compliance looks fairly good, 77%.  AHI 1.3, leak 3.4. For seizures, remains on Lamictal XR 200 mg daily. Is using full face. He did travel in early April, but had run out of supplies, so he couldn't use. He has been getting his supplies from [place], but they don't fit as well as when purchased from DME. With the [facility] supplies after a few days the mask would split at the nose piece. With BIPAP, he sleeps better, no snoring. He does still have some lingering daytime sleepiness, sometimes naps. He is on disability. Claims mental health is doing well. Denies any seizures. Still on Lamictal XR 200 mg, 2 tablets at bedtime.  Takes Zoloft from psych. He can no longer afford to buy supplies from [employer]. Denies any health issues. Lamictal level was 8.19 March 2022.     Dr. Frances Furbish 09/19/2020: I reviewed his BiPAP compliance data from 06/19/2021 through 07/18/2021, which is a total of 30 days, during which time he used his machine 29 days with percent use days greater than 4 hours at 93%, indicating excellent compliance with an average usage of 7 hours and 3 minutes, residual AHI at goal at 1.4/h, leak on the low side with a 95th percentile at 1.8 L/min, pressure of 20/15 centimeters with a rate of 68/min.  He reports still feeling sleepy during the day and fatigue.  Of note, he takes sertraline 200 mg at bedtime and Lamictal 200 mg at bedtime.  He is advised to talk to  his psychiatrist about changing his medication potentially if it is feasible.  He does state that they tried a new medication recently and he only took it for 1 day and had a reaction to it including changes in mood and vomiting.  He is up-to-date with his BiPAP supplies.  He is able to tolerate the pressure.  He uses a full facemask.  He does have some residual leak and snoring but I explained to him that his leak is actually rather small when I look at the download, especially considering the fact that he uses a fullface mask and the pressure is quite high.  He reports that he does not currently drive.    Observations/Objective: Via video visit, is alert and oriented, speech is clear and concise, follows exam commands, walks independently moves extremities well  Assessment and Plan: 1.  OSA and central sleep apnea on BiPAP ST -Overall, compliance looks good, his compliance would be superb if he had adequate supplies, he has been purchasing off [employer], and they have not fit as well as when supplied by DME -Will send an order to DME to continue current settings, send supplies as needed, likely will need to be sent to a new DME as his insurance has changed -He is motivated to continue BiPAP usage, has clear benefit  2.  Seizures -Denies any recent seizure events -EMU admission in February 2021 felt consistent with nonepileptic events, recommended continue Lamictal -Continue Lamictal XR  200 mg, 2 tablets at bedtime  Orders Placed This Encounter  Procedures   For home use only DME Bipap   Meds ordered this encounter  Medications   LamoTRIgine 200 MG TB24 24 hour tablet    Sig: Take 2 tablets (400 mg total) by mouth at bedtime.    Dispense:  180 tablet    Refill:  1   Follow Up Instructions: 6 months in office    I discussed the assessment and treatment plan with the patient. The patient was provided an opportunity to ask questions and all were answered. The patient agreed with the plan and  demonstrated an understanding of the instructions.   The patient was advised to call back or seek an in-person evaluation if the symptoms worsen or if the condition fails to improve as anticipated.  Otila Kluver, DNP  [facility] [address], [address] [place], [place] [address] [phone]

## 2022-12-09 NOTE — Patient Instructions (Signed)
Great to see you today. I will send an order to the DME for continued BiPAP supplies Please let me know if you have any questions. Continue Lamictal for seizure prevention Follow-up in 6 months for an office visit Thanks!

## 2022-12-09 NOTE — Telephone Encounter (Signed)
-----   Message from Glean Salvo, NP sent at 12/09/2022  9:13 AM EDT ----- Please send an order to DME for BiPAP supplies.  May need to be sent to new DME, he now has Medicare.  Thanks, Maralyn Sago

## 2022-12-10 DIAGNOSIS — Z0100 Encounter for examination of eyes and vision without abnormal findings: Secondary | ICD-10-CM | POA: Diagnosis not present

## 2022-12-12 ENCOUNTER — Encounter (HOSPITAL_COMMUNITY): Payer: Self-pay

## 2022-12-12 ENCOUNTER — Ambulatory Visit (INDEPENDENT_AMBULATORY_CARE_PROVIDER_SITE_OTHER): Payer: Medicare HMO | Admitting: Clinical

## 2022-12-12 DIAGNOSIS — F419 Anxiety disorder, unspecified: Secondary | ICD-10-CM | POA: Diagnosis not present

## 2022-12-12 DIAGNOSIS — F331 Major depressive disorder, recurrent, moderate: Secondary | ICD-10-CM | POA: Diagnosis not present
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
## 2022-12-13 DIAGNOSIS — R109 Unspecified abdominal pain: Secondary | ICD-10-CM | POA: Diagnosis not present

## 2022-12-13 DIAGNOSIS — F331 Major depressive disorder, recurrent, moderate: Secondary | ICD-10-CM | POA: Diagnosis not present

## 2022-12-13 DIAGNOSIS — M5116 Intervertebral disc disorders with radiculopathy, lumbar region: Secondary | ICD-10-CM | POA: Diagnosis not present

## 2022-12-13 DIAGNOSIS — Z6834 Body mass index (BMI) 34.0-34.9, adult: Secondary | ICD-10-CM | POA: Diagnosis not present

## 2022-12-13 DIAGNOSIS — R030 Elevated blood-pressure reading, without diagnosis of hypertension: Secondary | ICD-10-CM | POA: Diagnosis not present

## 2022-12-13 DIAGNOSIS — J45909 Unspecified asthma, uncomplicated: Secondary | ICD-10-CM | POA: Diagnosis not present

## 2022-12-16 ENCOUNTER — Telehealth: Payer: Medicare HMO | Admitting: Neurology

## 2022-12-19 ENCOUNTER — Ambulatory Visit: Payer: Medicare HMO | Admitting: Neurology

## 2022-12-19 ENCOUNTER — Encounter: Payer: Self-pay | Admitting: Neurology

## 2022-12-24 ENCOUNTER — Telehealth: Payer: Self-pay

## 2022-12-24 NOTE — Telephone Encounter (Signed)
Orders faxed to [facility]

## 2022-12-27 ENCOUNTER — Telehealth (INDEPENDENT_AMBULATORY_CARE_PROVIDER_SITE_OTHER): Payer: Medicare HMO | Admitting: Psychiatry

## 2022-12-27 ENCOUNTER — Encounter: Payer: Self-pay | Admitting: Psychiatry

## 2022-12-27 DIAGNOSIS — M7552 Bursitis of left shoulder: Secondary | ICD-10-CM | POA: Diagnosis not present

## 2022-12-27 DIAGNOSIS — M25512 Pain in left shoulder: Secondary | ICD-10-CM | POA: Diagnosis not present

## 2022-12-27 DIAGNOSIS — F330 Major depressive disorder, recurrent, mild: Secondary | ICD-10-CM | POA: Diagnosis not present

## 2022-12-27 DIAGNOSIS — R030 Elevated blood-pressure reading, without diagnosis of hypertension: Secondary | ICD-10-CM | POA: Diagnosis not present

## 2022-12-27 DIAGNOSIS — M791 Myalgia, unspecified site: Secondary | ICD-10-CM | POA: Diagnosis not present

## 2022-12-27 DIAGNOSIS — Z6834 Body mass index (BMI) 34.0-34.9, adult: Secondary | ICD-10-CM | POA: Diagnosis not present

## 2022-12-27 DIAGNOSIS — S29011A Strain of muscle and tendon of front wall of thorax, initial encounter: Secondary | ICD-10-CM | POA: Diagnosis not present

## 2022-12-27 MED ORDER — VENLAFAXINE HCL ER 37.5 MG PO CP24: 37.5000 mg | ORAL_CAPSULE | Freq: Every day | ORAL | 0 refills | Status: DC

## 2022-12-27 MED ORDER — VENLAFAXINE HCL ER 75 MG PO CP24
75.0000 mg | ORAL_CAPSULE | Freq: Every day | ORAL | 1 refills | Status: DC
Start: 2023-01-03 — End: 2023-03-03

## 2022-12-27 NOTE — Progress Notes (Signed)
Virtual Visit via Video Note  I connected with Dale Bender on 12/27/22 at 11:30 AM EDT by a video enabled telemedicine application and verified that I am speaking with the correct person using two identifiers.  Location: Patient: home Provider: office Persons participated in the visit- patient, provider    I discussed the limitations of evaluation and management by telemedicine and the availability of in person appointments. The patient expressed understanding and agreed to proceed.     I discussed the assessment and treatment plan with the patient. The patient was provided an opportunity to ask questions and all were answered. The patient agreed with the plan and demonstrated an understanding of the instructions.   The patient was advised to call back or seek an in-person evaluation if the symptoms worsen or if the condition fails to improve as anticipated.  I provided 15 minutes of non-face-to-face time during this encounter.   Neysa Hotter, MD    [facility] MD/PA/NP OP Progress Note  12/27/2022 11:58 AM Dale Bender  MRN:  [id number]  Chief Complaint:  Chief Complaint  Patient presents with   Follow-up   HPI:  According to the chart review, the following events have occurred since the last visit: The patient was seen by neurology for OSA, seizures. No adjustment in his medication.   This is a follow-up appointment for depression.  He states that he has some days which he feels bad.  It tends to occur a few times per week.  He does not want to say anything and keeps to himself.  He reports occasional conflict with his wife.  He states that it is always his fault as he has not been able to do things such as cooking.  Although he eventually do those things, he just does not want to do those.  He had a good Mother's Day.  He went to church.  He also goes fishing a few times per week, and it has been calm.  He feels drowsy in the morning, and wonders if this is due to sertraline.   He has been able to use CPAP machine regularly.  He denies SI. He feels anxious at times. He denies alcohol use or drug use.  He agrees to try medication adjustment as below.   BP:   125/66 2023    Visit Diagnosis:    ICD-10-CM   1. MDD (major depressive disorder), recurrent episode, mild (HCC)  F33.0       Past Psychiatric History: Please see initial evaluation for full details. I have reviewed the history. No updates at this time.     Past Medical History:  Past Medical History:  Diagnosis Date   Anxiety    Arthritis    Back pain    Depression, major, recurrent, moderate (HCC)    GERD (gastroesophageal reflux disease)    History of seizures    Lower extremity weakness    Prostate nodule    Benign   Sleep apnea    Suicidal ideation    Umbilical hernia    Urethral stricture in male     Past Surgical History:  Procedure Laterality Date   FRACTURE SURGERY     LT arm, LT leg, Rt leg   PROSTATE BIOPSY     UMBILICAL HERNIA REPAIR N/A 12/03/2019   Procedure: HERNIA REPAIR UMBILICAL ADULT/ WITH MESH;  Surgeon: Franky Macho, MD;  Location: [facility];  Service: General;  Laterality: N/A;   URETHRAL STRICTURE DILATATION  Family Psychiatric History: Please see initial evaluation for full details. I have reviewed the history. No updates at this time.     Family History:  Family History  Problem Relation Age of Onset   Hypertension Mother    Diabetes Mother    Other Father        died at age 69 in accident - log fell on him   Sleep apnea Sister    Kidney cancer Maternal Aunt    Prostate cancer Paternal Uncle    Colon cancer Neg Hx    Esophageal cancer Neg Hx    Liver cancer Neg Hx    Pancreatic cancer Neg Hx     Social History:  Social History   Socioeconomic History   Marital status: Married    Spouse name: Not on file   Number of children: 2   Years of education: 10th grade   Highest education level: Not on file  Occupational History   Occupation:  Disability  Tobacco Use   Smoking status: Former    Packs/day: 0.50    Years: 10.00    Additional pack years: 0.00    Total pack years: 5.00    Types: Cigarettes    Quit date: 12/01/2002    Years since quitting: 20.0   Smokeless tobacco: Never  Vaping Use   Vaping Use: Never used  Substance and Sexual Activity   Alcohol use: Never   Drug use: Never   Sexual activity: Not on file  Other Topics Concern   Not on file  Social History Narrative   Lives at home with his wife and children.   Right-handed.   2 cups caffeine per day.   Social Determinants of Health   Financial Resource Strain: Not on file  Food Insecurity: Not on file  Transportation Needs: Not on file  Physical Activity: Not on file  Stress: Not on file  Social Connections: Not on file    Allergies: No Known Allergies  Metabolic Disorder Labs: No results found for: "HGBA1C", "MPG" No results found for: "PROLACTIN" Lab Results  Component Value Date   TRIG 187 (H) 08/05/2019   Lab Results  Component Value Date   TSH 1.920 11/02/2020   TSH 2.430 06/10/2019    Therapeutic Level Labs: No results found for: "LITHIUM" No results found for: "VALPROATE" No results found for: "CBMZ"  Current Medications: Current Outpatient Medications  Medication Sig Dispense Refill   venlafaxine XR (EFFEXOR XR) 37.5 MG 24 hr capsule Take 1 capsule (37.5 mg total) by mouth daily with breakfast for 7 days. 7 capsule 0   [START ON 01/03/2023] venlafaxine XR (EFFEXOR-XR) 75 MG 24 hr capsule Take 1 capsule (75 mg total) by mouth daily with breakfast. 30 capsule 1   albuterol (VENTOLIN HFA) 108 (90 Base) MCG/ACT inhaler Inhale 2 puffs into the lungs every 6 (six) hours as needed for wheezing or shortness of breath. 8 g 0   buprenorphine (BUTRANS) 10 MCG/HR PTWK 1 patch once a week.     guaiFENesin-dextromethorphan (ROBITUSSIN DM) 100-10 MG/5ML syrup Take 5-10 mLs by mouth every 4 (four) hours as needed for cough. 118 mL 0    LamoTRIgine 200 MG TB24 24 hour tablet Take 2 tablets (400 mg total) by mouth at bedtime. 180 tablet 1   omeprazole (PRILOSEC) 40 MG capsule Take 40 mg by mouth daily.     sertraline (ZOLOFT) 100 MG tablet Take 2 tablets (200 mg total) by mouth daily. 180 tablet 0   ZTLIDO 1.8 %  PTCH Apply 1 patch topically 3 (three) times daily.     No current facility-administered medications for this visit.     Musculoskeletal: Strength & Muscle Tone:  N/A Gait & Station:  N/A Patient leans: N/A  Psychiatric Specialty Exam: Review of Systems  Psychiatric/Behavioral:  Positive for dysphoric mood and sleep disturbance. Negative for agitation, behavioral problems, confusion, decreased concentration, hallucinations, self-injury and suicidal ideas. The patient is not nervous/anxious and is not hyperactive.   All other systems reviewed and are negative.   There were no vitals taken for this visit.There is no height or weight on file to calculate BMI.  General Appearance: Fairly Groomed  Eye Contact:  Good  Speech:  Clear and Coherent  Volume:  Normal  Mood:   some days bad  Affect:  Appropriate, Congruent, and slightly down  Thought Process:  Coherent  Orientation:  Full (Time, Place, and Person)  Thought Content: Logical   Suicidal Thoughts:  No  Homicidal Thoughts:  No  Memory:  Immediate;   Good  Judgement:  Good  Insight:  Good  Psychomotor Activity:  Normal  Concentration:  Concentration: Good and Attention Span: Good  Recall:  Good  Fund of Knowledge: Good  Language: Good  Akathisia:  No  Handed:  Right  AIMS (if indicated): not done  Assets:  Communication Skills Desire for Improvement  ADL's:  Intact  Cognition: WNL  Sleep:   hypersomnia   Screenings: GAD-7    Advertising copywriter from 12/12/2022 in [facility] [place] Counselor from 12/06/2021 in [facility] [place]  Total GAD-7 Score 18 18      PHQ2-9     Flowsheet Row Counselor from 12/12/2022 in [facility] [place] Office Visit from 01/01/2022 in [facility] Counselor from 12/06/2021 in [facility] [place] Video Visit from 08/30/2021 in [facility] Video Visit from 06/01/2021 in [facility]  PHQ-2 Total Score 2 0 4 4 2   PHQ-9 Total Score 11 2 20 10 5       Flowsheet Row Counselor from 12/12/2022 in [facility] [place] ED to Hosp-Admission (Discharged) from 04/03/2022 in [place] [facility] Office Visit from 01/01/2022 in [facility]  C-SSRS RISK CATEGORY No Risk No Risk Low Risk        Assessment and Plan:  Dale Bender is a 51 y.o. year old male with a history of depression, complex partial seizure, obstructive sleep apnea on CPAP, who presents for follow up appointment for below.   1. MDD (major depressive disorder), recurrent episode, mild (HCC) Acute stressors include:  Other stressors include:  seizure disorder, marital conflict, right leg weakness   History:  admitted to [facility] in March 2023 after SA of jumping from a moving car/overdosing of muscle relaxants  He reports experiencing occasional depressive symptoms with irritability, and has possible adverse reaction of drowsiness in the morning.  Will cross taper from serotonin to venlafaxine to see if it mitigates its side effect.  Discussed potential risk of hypertension and serotonin syndrome.    Plan Decrease sertraline by 50 mg every week until discontinuation. Start venlafaxine 37.5 mg daily for one week, then 75 mg daily  Referred to therapy at [place] (if his insurance is accepted) Next appointment- 6/27 at 11 am, video - on buprenorphine patch    -  Reviewed TSH ; wnl   Past trials of  medication: sertraline, Abilify (irritability, nausea)   The patient demonstrates the following risk factors for suicide: Chronic risk factors for suicide include: psychiatric disorder of depression. Acute risk factors for suicide include: recent suicide attempt, loss (financial, interpersonal, professional). Protective factors for this patient include: positive social support, responsibility to others (children, family), coping skills and hope for the future. Considering these factors, the overall suicide risk at this point appears to be moderate, but not at imminent risk. Patient is appropriate for outpatient follow up. Emergency resources which includes 911, ED, suicide crisis line [phone]) are discussed.        Collaboration of Care: Collaboration of Care: Other reviewed notes in Epic  Patient/Guardian was advised Release of Information must be obtained prior to any record release in order to collaborate their care with an outside provider. Patient/Guardian was advised if they have not already done so to contact the registration department to sign all necessary forms in order for [name] to release information regarding their care.   Consent: Patient/Guardian gives verbal consent for treatment and assignment of benefits for services provided during this visit. Patient/Guardian expressed understanding and agreed to proceed.    Neysa Hotter, MD 12/27/2022, 11:58 AM

## 2023-01-13 ENCOUNTER — Ambulatory Visit (INDEPENDENT_AMBULATORY_CARE_PROVIDER_SITE_OTHER): Payer: Medicare HMO | Admitting: Clinical

## 2023-01-13 DIAGNOSIS — F419 Anxiety disorder, unspecified: Secondary | ICD-10-CM | POA: Diagnosis not present

## 2023-01-13 DIAGNOSIS — F331 Major depressive disorder, recurrent, moderate: Secondary | ICD-10-CM | POA: Diagnosis not present

## 2023-01-13 NOTE — Progress Notes (Signed)
Virtual Visit via Video Note  I connected with Dale Bender on 01/13/23 at  9:00 AM EDT by a video enabled telemedicine application and verified that I am speaking with the correct person using two identifiers.  Location: Patient: Home Provider: Office   I discussed the limitations of evaluation and management by telemedicine and the availability of in person appointments. The patient expressed understanding and agreed to proceed.   THERAPIST PROGRESS NOTE   Session Time: 9:00 AM-9:30 AM   Participation Level: Active   Behavioral Response: CasualAlertImprovedMood   Type of Therapy: Individual Therapy   Treatment Goals addressed: Coping   Interventions: CBT, Solution Focused, Strength-based, Supportive and Social Skills Training   Summary: Dale Bender is a 52 y.o. male who presents with Depression and Anxiety . The OPT therapist worked with the patient for his treatment. The OPT therapist utilized Motivational Interviewing to assist in creating therapeutic repore. The patient in the session was engaged and work in collaboration giving feedback about his triggers and symptoms over the past few weeks. The patient spoke about frustration with his Zenaida Niece recently breaking down and leaving him without transportation and noted hopefully his brother will be coming to help him work on the [place] this week. The OPT therapist utilized Cognitive Behavioral Therapy through cognitive restructuring as well as worked with the patient on coping strategies to assist in management of mood, improve coping, and improve positive thinking while empowering the patient to be active within his health limits.The OPT therapist examined upcoming health appointments and continued to encourage the patient to stay consistent and compliant with all medical appointments and recommendations.The patient in this session noted he has been active  walking daily and going fishing to help balance his stressors and as part of  his leisure. The OPT therapist worked with the patient on mindfulness around his stressors and frustration levels and worked in session on in my control out of my control exercise.    Suicidal/Homicidal: Nowithout intent/plan   Therapist Response: The OPT therapist worked with the patient for the patients  scheduled session. The patient was engaged in his session and gave feedback in relation to triggers, symptoms, and behavior responses over the past few weeks. The OPT therapist worked with the patient utilizing an in session Cognitive Behavioral Therapy exercise. The patient was responsive in the session and verbalized, " I have going Fishing more now to help me with my stressors and trying to be more aware of when I need to take a break my wife was worried I was going to be out there again this morning after working on the [place] last night and getting frustrated , but I decided to try to take a break and get back to it maybe later this week when I have help ". The OPT therapist worked with the patient on setting realistic goals/ low hanging fruit to allow the patient some feeling of success in completing tasks and to help improve his mood. The OPT therapist overviewed mindfulness and the patient has been doing well with managing his stressors with down time leisure's including walking and fishing.  The patient spoke about upcoming transitions with family members getting out of school for the upcoming Summer break.    Plan:  Patient will return in 2/3 weeks   Diagnosis:      Axis I: Recurrent Depression with Anxiety  Axis II: No diagnosis Collaboration of Care: No additional collaboration for this session.   Patient/Guardian was advised Release of Information must be obtained prior to any record release in order to collaborate their care with an outside provider. Patient/Guardian was advised if they have not already done so to contact the registration department to sign all  necessary forms in order for [name] to release information regarding their care.    Consent: Patient/Guardian gives verbal consent for treatment and assignment of benefits for services provided during this visit. Patient/Guardian expressed understanding and agreed to proceed.      I discussed the assessment and treatment plan with the patient. The patient was provided an opportunity to ask questions and all were answered. The patient agreed with the plan and demonstrated an understanding of the instructions.   The patient was advised to call back or seek an in-person evaluation if the symptoms worsen or if the condition fails to improve as anticipated.   I provided 30 minutes of non-face-to-face time during this encounter.   [name], LCSW   01/13/2023

## 2023-01-18 DIAGNOSIS — G40909 Epilepsy, unspecified, not intractable, without status epilepticus: Secondary | ICD-10-CM | POA: Diagnosis not present

## 2023-01-18 DIAGNOSIS — R45851 Suicidal ideations: Secondary | ICD-10-CM | POA: Diagnosis not present

## 2023-01-18 DIAGNOSIS — R000 Tachycardia, unspecified: Secondary | ICD-10-CM | POA: Diagnosis not present

## 2023-01-18 DIAGNOSIS — Z9151 Personal history of suicidal behavior: Secondary | ICD-10-CM | POA: Diagnosis not present

## 2023-01-18 DIAGNOSIS — F32A Depression, unspecified: Secondary | ICD-10-CM | POA: Diagnosis not present

## 2023-01-18 DIAGNOSIS — F29 Unspecified psychosis not due to a substance or known physiological condition: Secondary | ICD-10-CM | POA: Diagnosis not present

## 2023-01-18 DIAGNOSIS — I440 Atrioventricular block, first degree: Secondary | ICD-10-CM | POA: Diagnosis not present

## 2023-01-18 DIAGNOSIS — Z9152 Personal history of nonsuicidal self-harm: Secondary | ICD-10-CM | POA: Diagnosis not present

## 2023-01-18 DIAGNOSIS — R4182 Altered mental status, unspecified: Secondary | ICD-10-CM | POA: Diagnosis not present

## 2023-01-18 DIAGNOSIS — F439 Reaction to severe stress, unspecified: Secondary | ICD-10-CM | POA: Diagnosis not present

## 2023-01-18 DIAGNOSIS — R451 Restlessness and agitation: Secondary | ICD-10-CM | POA: Diagnosis not present

## 2023-01-18 DIAGNOSIS — F329 Major depressive disorder, single episode, unspecified: Secondary | ICD-10-CM | POA: Diagnosis not present

## 2023-01-18 DIAGNOSIS — R569 Unspecified convulsions: Secondary | ICD-10-CM | POA: Diagnosis not present

## 2023-01-18 DIAGNOSIS — R55 Syncope and collapse: Secondary | ICD-10-CM | POA: Diagnosis not present

## 2023-01-18 DIAGNOSIS — R410 Disorientation, unspecified: Secondary | ICD-10-CM | POA: Diagnosis not present

## 2023-01-18 DIAGNOSIS — R5381 Other malaise: Secondary | ICD-10-CM | POA: Diagnosis not present

## 2023-01-19 DIAGNOSIS — F32A Depression, unspecified: Secondary | ICD-10-CM | POA: Diagnosis not present

## 2023-01-22 DIAGNOSIS — Z79891 Long term (current) use of opiate analgesic: Secondary | ICD-10-CM | POA: Diagnosis not present

## 2023-01-22 DIAGNOSIS — M5116 Intervertebral disc disorders with radiculopathy, lumbar region: Secondary | ICD-10-CM | POA: Diagnosis not present

## 2023-01-22 DIAGNOSIS — M549 Dorsalgia, unspecified: Secondary | ICD-10-CM | POA: Diagnosis not present

## 2023-01-22 DIAGNOSIS — Z79899 Other long term (current) drug therapy: Secondary | ICD-10-CM | POA: Diagnosis not present

## 2023-01-22 DIAGNOSIS — G894 Chronic pain syndrome: Secondary | ICD-10-CM | POA: Diagnosis not present

## 2023-01-29 NOTE — Progress Notes (Deleted)
BH MD/PA/NP OP Progress Note  01/29/2023 1:42 PM Dale Bender  MRN:  [id number]  Chief Complaint: No chief complaint on file.  HPI:  According to the chart review, the following events have occurred since the last visit: The patient was brought to ED after having a pseudoseizure in the setting of loss of his aunt. He did not meet criteria for IVC, and was discharged.  Dale Bender is a 52 y.o. male presents with a story that the patient was at church there was a telephone call where the patient was notified that his aunt passed away. Patient got to his car had a seizure-like episode had passed out ambulance was called patient was brought home there the the ambulance crew was not able to get him inside to his house. All this information is from the patient's wife Dale Bender who I called and spoke with. Patient himself is belligerent showing aggression toward the EMS crew and nursing staff. Seems confused with acute delirium. According to his wife last year he had the same or similar kind of episode and had diagnoses of psychiatric condition. Today he said he did not want to live no more. Patient was crying and hitting himself he stated to his wife couple days ago that he want to go to sleep and not wake up.   Visit Diagnosis: No diagnosis found.  Past Psychiatric History: Please see initial evaluation for full details. I have reviewed the history. No updates at this time.     Past Medical History:  Past Medical History:  Diagnosis Date   Anxiety    Arthritis    Back pain    Depression, major, recurrent, moderate (HCC)    GERD (gastroesophageal reflux disease)    History of seizures    Lower extremity weakness    Prostate nodule    Benign   Sleep apnea    Suicidal ideation    Umbilical hernia    Urethral stricture in male     Past Surgical History:  Procedure Laterality Date   FRACTURE SURGERY     LT arm, LT leg, Rt leg   PROSTATE BIOPSY     UMBILICAL HERNIA REPAIR  N/A 12/03/2019   Procedure: HERNIA REPAIR UMBILICAL ADULT/ WITH MESH;  Surgeon: Franky Macho, MD;  Location: [facility];  Service: General;  Laterality: N/A;   URETHRAL STRICTURE DILATATION      Family Psychiatric History: Please see initial evaluation for full details. I have reviewed the history. No updates at this time.     Family History:  Family History  Problem Relation Age of Onset   Hypertension Mother    Diabetes Mother    Other Father        died at age 60 in accident - log fell on him   Sleep apnea Sister    Kidney cancer Maternal Aunt    Prostate cancer Paternal Uncle    Colon cancer Neg Hx    Esophageal cancer Neg Hx    Liver cancer Neg Hx    Pancreatic cancer Neg Hx     Social History:  Social History   Socioeconomic History   Marital status: Married    Spouse name: Not on file   Number of children: 2   Years of education: 10th grade   Highest education level: Not on file  Occupational History   Occupation: Disability  Tobacco Use   Smoking status: Former    Packs/day: 0.50    Years: 10.00  Additional pack years: 0.00    Total pack years: 5.00    Types: Cigarettes    Quit date: 12/01/2002    Years since quitting: 20.1   Smokeless tobacco: Never  Vaping Use   Vaping Use: Never used  Substance and Sexual Activity   Alcohol use: Never   Drug use: Never   Sexual activity: Not on file  Other Topics Concern   Not on file  Social History Narrative   Lives at home with his wife and children.   Right-handed.   2 cups caffeine per day.   Social Determinants of Health   Financial Resource Strain: Not on file  Food Insecurity: Not on file  Transportation Needs: Not on file  Physical Activity: Not on file  Stress: Not on file  Social Connections: Not on file    Allergies: No Known Allergies  Metabolic Disorder Labs: No results found for: "HGBA1C", "MPG" No results found for: "PROLACTIN" Lab Results  Component Value Date   TRIG 187 (H)  08/05/2019   Lab Results  Component Value Date   TSH 1.920 11/02/2020   TSH 2.430 06/10/2019    Therapeutic Level Labs: No results found for: "LITHIUM" No results found for: "VALPROATE" No results found for: "CBMZ"  Current Medications: Current Outpatient Medications  Medication Sig Dispense Refill   albuterol (VENTOLIN HFA) 108 (90 Base) MCG/ACT inhaler Inhale 2 puffs into the lungs every 6 (six) hours as needed for wheezing or shortness of breath. 8 g 0   buprenorphine (BUTRANS) 10 MCG/HR PTWK 1 patch once a week.     guaiFENesin-dextromethorphan (ROBITUSSIN DM) 100-10 MG/5ML syrup Take 5-10 mLs by mouth every 4 (four) hours as needed for cough. 118 mL 0   LamoTRIgine 200 MG TB24 24 hour tablet Take 2 tablets (400 mg total) by mouth at bedtime. 180 tablet 1   omeprazole (PRILOSEC) 40 MG capsule Take 40 mg by mouth daily.     sertraline (ZOLOFT) 100 MG tablet Take 2 tablets (200 mg total) by mouth daily. 180 tablet 0   venlafaxine XR (EFFEXOR XR) 37.5 MG 24 hr capsule Take 1 capsule (37.5 mg total) by mouth daily with breakfast for 7 days. 7 capsule 0   venlafaxine XR (EFFEXOR-XR) 75 MG 24 hr capsule Take 1 capsule (75 mg total) by mouth daily with breakfast. 30 capsule 1   ZTLIDO 1.8 % PTCH Apply 1 patch topically 3 (three) times daily.     No current facility-administered medications for this visit.     Musculoskeletal: Strength & Muscle Tone:  N/A Gait & Station:  N/A Patient leans: N/A  Psychiatric Specialty Exam: Review of Systems  There were no vitals taken for this visit.There is no height or weight on file to calculate BMI.  General Appearance: {Appearance:22683}  Eye Contact:  {BHH EYE CONTACT:22684}  Speech:  Clear and Coherent  Volume:  Normal  Mood:  {BHH MOOD:22306}  Affect:  {Affect (PAA):22687}  Thought Process:  Coherent  Orientation:  Full (Time, Place, and Person)  Thought Content: Logical   Suicidal Thoughts:  {ST/HT (PAA):22692}  Homicidal  Thoughts:  {ST/HT (PAA):22692}  Memory:  Immediate;   Good  Judgement:  {Judgement (PAA):22694}  Insight:  {Insight (PAA):22695}  Psychomotor Activity:  Normal  Concentration:  Concentration: Good and Attention Span: Good  Recall:  Good  Fund of Knowledge: Good  Language: Good  Akathisia:  No  Handed:  Right  AIMS (if indicated): not done  Assets:  Communication Skills Desire for Improvement  ADL's:  Intact  Cognition: WNL  Sleep:  {BHH GOOD/FAIR/POOR:22877}   Screenings: GAD-7    Advertising copywriter from 12/12/2022 in [facility] [place] Counselor from 12/06/2021 in [facility]  Total GAD-7 Score 18 18      PHQ2-9    Flowsheet Row Counselor from 12/12/2022 in [facility] [place] Office Visit from 01/01/2022 in [facility] Counselor from 12/06/2021 in [facility] at [place] Video Visit from 08/30/2021 in [facility] Video Visit from 06/01/2021 in [facility]  PHQ-2 Total Score 2 0 4 4 2   PHQ-9 Total Score 11 2 20 10 5       Flowsheet Row Counselor from 12/12/2022 in [facility] at [place] ED to Hosp-Admission (Discharged) from 04/03/2022 in [facility] Office Visit from 01/01/2022 in [facility]  C-SSRS RISK CATEGORY No Risk No Risk Low Risk        Assessment and Plan:  SHANDY PORRES is a 52 y.o. year old male with a history of depression, complex partial seizure, obstructive sleep apnea on CPAP, who presents for follow up appointment for below.    1. MDD (major depressive disorder), recurrent episode, mild (HCC) Acute stressors include:  Other stressors include:  seizure disorder, marital conflict, right leg weakness    History:  admitted to [facility] in March 2023 after SA of jumping from a moving car/overdosing of muscle relaxants  He reports experiencing occasional depressive symptoms with irritability, and has possible adverse reaction of drowsiness in the morning.  Will cross taper from serotonin to venlafaxine to see if it mitigates its side effect.  Discussed potential risk of hypertension and serotonin syndrome.    Plan Decrease sertraline by 50 mg every week until discontinuation. Start venlafaxine 37.5 mg daily for one week, then 75 mg daily  Referred to therapy at [place] (if his insurance is accepted) Next appointment- 6/27 at 11 am, video - on buprenorphine patch    - Reviewed TSH ; wnl   Past trials of medication: sertraline, Abilify (irritability, nausea)   The patient demonstrates the following risk factors for suicide: Chronic risk factors for suicide include: psychiatric disorder of depression. Acute risk factors for suicide include: recent suicide attempt, loss (financial, interpersonal, professional). Protective factors for this patient include: positive social support, responsibility to others (children, family), coping skills and hope for the future. Considering these factors, the overall suicide risk at this point appears to be moderate, but not at imminent risk. Patient is appropriate for outpatient follow up. Emergency resources which includes 911, ED, suicide crisis line [phone]) are discussed.    Collaboration of Care: Collaboration of Care: {BH OP Collaboration of Care:21014065}  Patient/Guardian was advised Release of Information must be obtained prior to any record release in order to collaborate their care with an outside provider. Patient/Guardian was advised if they have not already done so to contact the registration department to sign all necessary forms in order for [name] to release information regarding their care.   Consent: Patient/Guardian gives verbal consent  for treatment and assignment of benefits for services provided during this visit. Patient/Guardian expressed understanding and agreed to proceed.    Neysa Hotter, MD 01/29/2023, 1:42 PM

## 2023-02-06 ENCOUNTER — Telehealth: Payer: Medicare HMO | Admitting: Psychiatry

## 2023-02-08 NOTE — Progress Notes (Unsigned)
Virtual Visit via Telephone Note  I connected with Dale Bender on 02/10/23 at  2:00 PM EDT by telephone and verified that I am speaking with the correct person using two identifiers.  Location: Patient: home Provider: office Persons participated in the visit- patient, provider    I discussed the limitations, risks, security and privacy concerns of performing an evaluation and management service by telephone and the availability of in person appointments. I also discussed with the patient that there may be a patient responsible charge related to this service. The patient expressed understanding and agreed to proceed.     I discussed the assessment and treatment plan with the patient. The patient was provided an opportunity to ask questions and all were answered. The patient agreed with the plan and demonstrated an understanding of the instructions.   The patient was advised to call back or seek an in-person evaluation if the symptoms worsen or if the condition fails to improve as anticipated.  I provided 15 minutes of non-face-to-face time during this encounter.   Neysa Hotter, MD      [facility] MD/PA/NP OP Progress Note  02/10/2023 4:23 PM Dale Bender  MRN:  [id number]  Chief Complaint:  Chief Complaint  Patient presents with   Follow-up   HPI:  According to the chart review, the following events have occurred since the last visit: The patient was brought to ED on 01/2023 after having a pseudoseizure in the setting of loss of his aunt. He did not meet criteria for IVC, and was discharged. "Dale Bender is a 52 y.o. male presents with a story that the patient was at church there was a telephone call where the patient was notified that his aunt passed away. Patient got to his car had a seizure-like episode had passed out ambulance was called patient was brought home there the ambulance crew was not able to get him inside to his house. All this information is from the  patient's wife Dale Bender who I called and spoke with. Patient himself is belligerent showing aggression toward the EMS crew and nursing staff. Seems confused with acute delirium. According to his wife last year he had the same or similar kind of episode and had diagnoses of psychiatric condition. Today he said he did not want to live no more. Patient was crying and hitting himself he stated to his wife couple days ago that he want to go to sleep and not wake up. "  He states that everything is going good.  His only concern is about his medication.  He feels drowsy in the morning.  He wants medication which does not cause him drowsiness.  When he is asked about recent ED visit, he states that he had another seizure after he heard the news of his aunt passed away.  He states that now he is doing okay.  He feels stuck with his kids.  He tends to feel frustrated when his children does not listen to him.  His anxiety is on and off.  He sleeps 10 hours.  He has slight decrease in appetite.  He denies SI.  He denies alcohol use or drug use.  After discussing treatment options at length, he agrees to try fluoxetine at this time.   Visit Diagnosis:    ICD-10-CM   1. MDD (major depressive disorder), recurrent episode, mild (HCC)  F33.0       Past Psychiatric History: Please see initial evaluation for full details. I have reviewed the  history. No updates at this time.     Past Medical History:  Past Medical History:  Diagnosis Date   Anxiety    Arthritis    Back pain    Depression, major, recurrent, moderate (HCC)    GERD (gastroesophageal reflux disease)    History of seizures    Lower extremity weakness    Prostate nodule    Benign   Sleep apnea    Suicidal ideation    Umbilical hernia    Urethral stricture in male     Past Surgical History:  Procedure Laterality Date   FRACTURE SURGERY     LT arm, LT leg, Rt leg   PROSTATE BIOPSY     UMBILICAL HERNIA REPAIR N/A 12/03/2019   Procedure:  HERNIA REPAIR UMBILICAL ADULT/ WITH MESH;  Surgeon: Franky Macho, MD;  Location: [facility];  Service: General;  Laterality: N/A;   URETHRAL STRICTURE DILATATION      Family Psychiatric History: Please see initial evaluation for full details. I have reviewed the history. No updates at this time.     Family History:  Family History  Problem Relation Age of Onset   Hypertension Mother    Diabetes Mother    Other Father        died at age 42 in accident - log fell on him   Sleep apnea Sister    Kidney cancer Maternal Aunt    Prostate cancer Paternal Uncle    Colon cancer Neg Hx    Esophageal cancer Neg Hx    Liver cancer Neg Hx    Pancreatic cancer Neg Hx     Social History:  Social History   Socioeconomic History   Marital status: Married    Spouse name: Not on file   Number of children: 2   Years of education: 10th grade   Highest education level: Not on file  Occupational History   Occupation: Disability  Tobacco Use   Smoking status: Former    Packs/day: 0.50    Years: 10.00    Additional pack years: 0.00    Total pack years: 5.00    Types: Cigarettes    Quit date: 12/01/2002    Years since quitting: 20.2   Smokeless tobacco: Never  Vaping Use   Vaping Use: Never used  Substance and Sexual Activity   Alcohol use: Never   Drug use: Never   Sexual activity: Not on file  Other Topics Concern   Not on file  Social History Narrative   Lives at home with his wife and children.   Right-handed.   2 cups caffeine per day.   Social Determinants of Health   Financial Resource Strain: Not on file  Food Insecurity: Not on file  Transportation Needs: Not on file  Physical Activity: Not on file  Stress: Not on file  Social Connections: Not on file    Allergies: No Known Allergies  Metabolic Disorder Labs: No results found for: "HGBA1C", "MPG" No results found for: "PROLACTIN" Lab Results  Component Value Date   TRIG 187 (H) 08/05/2019   Lab Results   Component Value Date   TSH 1.920 11/02/2020   TSH 2.430 06/10/2019    Therapeutic Level Labs: No results found for: "LITHIUM" No results found for: "VALPROATE" No results found for: "CBMZ"  Current Medications: Current Outpatient Medications  Medication Sig Dispense Refill   FLUoxetine (PROZAC) 10 MG capsule Take 1 capsule (10 mg total) by mouth daily for 7 days. 7 capsule 0   [  START ON 02/17/2023] FLUoxetine (PROZAC) 20 MG capsule Take 1 capsule (20 mg total) by mouth daily. Start after completing 10 mg daily for one week 30 capsule 1   albuterol (VENTOLIN HFA) 108 (90 Base) MCG/ACT inhaler Inhale 2 puffs into the lungs every 6 (six) hours as needed for wheezing or shortness of breath. 8 g 0   buprenorphine (BUTRANS) 10 MCG/HR PTWK 1 patch once a week.     guaiFENesin-dextromethorphan (ROBITUSSIN DM) 100-10 MG/5ML syrup Take 5-10 mLs by mouth every 4 (four) hours as needed for cough. 118 mL 0   LamoTRIgine 200 MG TB24 24 hour tablet Take 2 tablets (400 mg total) by mouth at bedtime. 180 tablet 1   omeprazole (PRILOSEC) 40 MG capsule Take 40 mg by mouth daily.     venlafaxine XR (EFFEXOR-XR) 75 MG 24 hr capsule Take 1 capsule (75 mg total) by mouth daily with breakfast. 30 capsule 1   ZTLIDO 1.8 % PTCH Apply 1 patch topically 3 (three) times daily.     No current facility-administered medications for this visit.     Musculoskeletal: Strength & Muscle Tone:  N/A Gait & Station:  N/A Patient leans: N/A  Psychiatric Specialty Exam: Review of Systems  Psychiatric/Behavioral:  Positive for dysphoric mood and sleep disturbance. Negative for agitation, behavioral problems, confusion, decreased concentration, hallucinations, self-injury and suicidal ideas. The patient is nervous/anxious. The patient is not hyperactive.   All other systems reviewed and are negative.   There were no vitals taken for this visit.There is no height or weight on file to calculate BMI.  General Appearance: NA   Eye Contact:  NA  Speech:  Clear and Coherent  Volume:  Normal  Mood:   fine  Affect:  NA  Thought Process:  Coherent  Orientation:  Full (Time, Place, and Person)  Thought Content: Logical   Suicidal Thoughts:  No  Homicidal Thoughts:  No  Memory:  Immediate;   Good  Judgement:  Good  Insight:  Present  Psychomotor Activity:  Normal  Concentration:  Concentration: Good and Attention Span: Good  Recall:  Good  Fund of Knowledge: Good  Language: Good  Akathisia:  No  Handed:  Right  AIMS (if indicated): not done  Assets:  Communication Skills Desire for Improvement  ADL's:  Intact  Cognition: WNL  Sleep:   hypersomnia   Screenings: GAD-7    Advertising copywriter from 12/12/2022 in [facility] [place] Counselor from 12/06/2021 in [facility] at [place]  Total GAD-7 Score 18 18      PHQ2-9    Flowsheet Row Counselor from 12/12/2022 in [facility] [place] Office Visit from 01/01/2022 in [facility] Counselor from 12/06/2021 in [facility] at [place] Video Visit from 08/30/2021 in [facility] Video Visit from 06/01/2021 in [facility]  PHQ-2 Total Score 2 0 4 4 2   PHQ-9 Total Score 11 2 20 10 5       Flowsheet Row Counselor from 12/12/2022 in [facility] [place] ED to Hosp-Admission (Discharged) from 04/03/2022 in [place] [facility] Office Visit from 01/01/2022 in [facility]  C-SSRS RISK CATEGORY No Risk No Risk Low Risk        Assessment and Plan:  THIERRY RITTENBERRY is a 52 y.o. year old male with a history of  depression, complex partial seizure, obstructive sleep apnea on CPAP, who presents for follow up appointment for below.   1. MDD  (major depressive disorder), recurrent episode, mild (HCC) Acute stressors include: loss of his aunt Other stressors include:  seizure disorder, marital conflict, right leg weakness   History:  admitted to [facility] in March 2023 after SA of jumping from a moving car/overdosing of muscle relaxants   Although he denies significant mood symptoms except occasionally irritability, he reports drowsiness since switching to venlafaxine.  Noted that he also reported that his drowsiness when he was on sertraline. Will switch to fluoxetine to see if this mitigates its potential side effect. He is advised to contact the office if he continues to experience drowsiness despite change in medication. Noted that he is also on lamotrigine, which can potentially cause this drowsiness.  Will continue to assess this.    Plan Decrease venlafaxine  Start fluoxetine 10 mg daily for one week, then 20 mg daily  Discontinue venlafaxine Next appointment- 8/28 at 8:30, video (he declined in person due to lack of transportation) - on buprenorphine patch    - Reviewed TSH ; wnl   Past trials of medication: sertraline, venlafaxine (drowsiness), Abilify (irritability, nausea)   The patient demonstrates the following risk factors for suicide: Chronic risk factors for suicide include: psychiatric disorder of depression. Acute risk factors for suicide include: recent suicide attempt, loss (financial, interpersonal, professional). Protective factors for this patient include: positive social support, responsibility to others (children, family), coping skills and hope for the future. Considering these factors, the overall suicide risk at this point appears to be moderate, but not at imminent risk. Patient is appropriate for outpatient follow up. Emergency resources which includes 911, ED, suicide crisis line [phone]) are discussed.    Collaboration of Care: Collaboration of Care: Other reviewed notes in  Epic  Patient/Guardian was advised Release of Information must be obtained prior to any record release in order to collaborate their care with an outside provider. Patient/Guardian was advised if they have not already done so to contact the registration department to sign all necessary forms in order for [name] to release information regarding their care.   Consent: Patient/Guardian gives verbal consent for treatment and assignment of benefits for services provided during this visit. Patient/Guardian expressed understanding and agreed to proceed.    Neysa Hotter, MD 02/10/2023, 4:23 PM

## 2023-02-10 ENCOUNTER — Encounter: Payer: Self-pay | Admitting: Psychiatry

## 2023-02-10 ENCOUNTER — Ambulatory Visit (INDEPENDENT_AMBULATORY_CARE_PROVIDER_SITE_OTHER): Payer: Medicare HMO | Admitting: Clinical

## 2023-02-10 ENCOUNTER — Telehealth (INDEPENDENT_AMBULATORY_CARE_PROVIDER_SITE_OTHER): Payer: Medicare HMO | Admitting: Psychiatry

## 2023-02-10 DIAGNOSIS — F331 Major depressive disorder, recurrent, moderate: Secondary | ICD-10-CM

## 2023-02-10 DIAGNOSIS — F330 Major depressive disorder, recurrent, mild: Secondary | ICD-10-CM | POA: Diagnosis not present

## 2023-02-10 DIAGNOSIS — F419 Anxiety disorder, unspecified: Secondary | ICD-10-CM | POA: Diagnosis not present

## 2023-02-10 MED ORDER — FLUOXETINE HCL 10 MG PO CAPS: 10.0000 mg | ORAL_CAPSULE | Freq: Every day | ORAL | 0 refills | Status: DC

## 2023-02-10 MED ORDER — FLUOXETINE HCL 20 MG PO CAPS: 20.0000 mg | ORAL_CAPSULE | Freq: Every day | ORAL | 1 refills | Status: DC

## 2023-02-10 NOTE — Progress Notes (Signed)
Virtual Visit via Video Note   I connected with Dale Bender on 02/10/23 at  9:00 AM EDT by a video enabled telemedicine application and verified that I am speaking with the correct person using two identifiers.   Location: Patient: Home Provider: Office   I discussed the limitations of evaluation and management by telemedicine and the availability of in person appointments. The patient expressed understanding and agreed to proceed.     THERAPIST PROGRESS NOTE   Session Time: 9:00 AM-9:25 AM   Participation Level: Active   Behavioral Response: CasualAlertImprovedMood   Type of Therapy: Individual Therapy   Treatment Goals addressed: Coping   Interventions: CBT, Solution Focused, Strength-based, Supportive and Social Skills Training   Summary: Dale Bender is a 52 y.o. male who presents with Depression and Anxiety . The OPT therapist worked with the patient for his treatment. The OPT therapist utilized Motivational Interviewing to assist in creating therapeutic repore. The patient in the session was engaged and work in collaboration giving feedback about his triggers and symptoms over the past few weeks. The patient spoke about chronic back painf rom deterioration of the spin and the recommendation from his physical heatlh team to do surgery to help with the pain. The patient spoke about his med management and feeling this is helping manage his symptoms aside from it causing some excess sleep in the mornings , in which the patient will be speaking about this with his prescriber Dr. Vanetta Shawl.The OPT therapist utilized Cognitive Behavioral Therapy through cognitive restructuring as well as worked with the patient on coping strategies to assist in management of mood, improve coping, and improve positive thinking while empowering the patient to be active within his health limits.The OPT therapist examined upcoming health appointments and continued to encourage the patient to stay  consistent and compliant with all medical appointments and recommendations.The patient in this session noted he has been active  walking daily and going fishing to help balance his stressors and as part of his leisure. The patient spoke about plans for the upcoming 4th of July holiday.The OPT therapist worked with the patient on mindfulness around his stressors and frustration levels and worked in session on in my control out of my control exercise.    Suicidal/Homicidal: Nowithout intent/plan   Therapist Response: The OPT therapist worked with the patient for the patients  scheduled session. The patient was engaged in his session and gave feedback in relation to triggers, symptoms, and behavior responses over the past few weeks. The OPT therapist worked with the patient utilizing an in session Cognitive Behavioral Therapy exercise. The patient was responsive in the session and verbalized, "Things overall have been going good, its just really my chronic backpain from the degeneration of my back, I am waiting the pain management sent a referral and I waiting to get a call but they are recommending the surgery ". The OPT therapist worked with the patient on setting realistic goals/ low hanging fruit to allow the patient some feeling of success in completing tasks and to help improve his mood. The OPT therapist overviewed mindfulness and the patient has been doing well with managing his stressors with down time leisure's including walking and fishing.  The patient spoke about upcoming time with family members getting out of the house and going to [place] upcoming for the 4th of July holiday.    Plan:  Patient will return in 2/3 weeks   Diagnosis:      Axis I: Recurrent Depression with  Anxiety                         Axis II: No diagnosis  Collaboration of Care: Overview of the patients involvement with med management program with prescriber Dr. Vanetta Shawl   Patient/Guardian was advised Release of Information  must be obtained prior to any record release in order to collaborate their care with an outside provider. Patient/Guardian was advised if they have not already done so to contact the registration department to sign all necessary forms in order for [name] to release information regarding their care.    Consent: Patient/Guardian gives verbal consent for treatment and assignment of benefits for services provided during this visit. Patient/Guardian expressed understanding and agreed to proceed.      I discussed the assessment and treatment plan with the patient. The patient was provided an opportunity to ask questions and all were answered. The patient agreed with the plan and demonstrated an understanding of the instructions.   The patient was advised to call back or seek an in-person evaluation if the symptoms worsen or if the condition fails to improve as anticipated.   I provided 25 minutes of non-face-to-face time during this encounter.   [name], LCSW   02/10/2023

## 2023-02-19 DIAGNOSIS — M5116 Intervertebral disc disorders with radiculopathy, lumbar region: Secondary | ICD-10-CM | POA: Diagnosis not present

## 2023-02-19 DIAGNOSIS — M549 Dorsalgia, unspecified: Secondary | ICD-10-CM | POA: Diagnosis not present

## 2023-02-19 DIAGNOSIS — G894 Chronic pain syndrome: Secondary | ICD-10-CM | POA: Diagnosis not present

## 2023-02-23 ENCOUNTER — Other Ambulatory Visit: Payer: Self-pay | Admitting: Psychiatry

## 2023-02-28 ENCOUNTER — Other Ambulatory Visit: Payer: Self-pay | Admitting: Psychiatry

## 2023-02-28 DIAGNOSIS — F3341 Major depressive disorder, recurrent, in partial remission: Secondary | ICD-10-CM

## 2023-03-03 ENCOUNTER — Ambulatory Visit (INDEPENDENT_AMBULATORY_CARE_PROVIDER_SITE_OTHER): Payer: Medicare HMO | Admitting: Clinical

## 2023-03-03 ENCOUNTER — Other Ambulatory Visit: Payer: Self-pay | Admitting: Psychiatry

## 2023-03-03 DIAGNOSIS — F331 Major depressive disorder, recurrent, moderate: Secondary | ICD-10-CM

## 2023-03-03 DIAGNOSIS — F339 Major depressive disorder, recurrent, unspecified: Secondary | ICD-10-CM

## 2023-03-03 DIAGNOSIS — F419 Anxiety disorder, unspecified: Secondary | ICD-10-CM

## 2023-03-03 MED ORDER — VENLAFAXINE HCL ER 75 MG PO CP24: 75.0000 mg | ORAL_CAPSULE | Freq: Every day | ORAL | 1 refills | Status: DC

## 2023-03-03 NOTE — Telephone Encounter (Signed)
Please contact [facility] and cancel fluoxetine orders.

## 2023-03-03 NOTE — Progress Notes (Signed)
Virtual Visit via Video Note   I connected with Dale Bender on 03/03/23 at  8:00 AM EDT by a video enabled telemedicine application and verified that I am speaking with the correct person using two identifiers.   Location: Patient: Home Provider: Office   I discussed the limitations of evaluation and management by telemedicine and the availability of in person appointments. The patient expressed understanding and agreed to proceed.     THERAPIST PROGRESS NOTE   Session Time: 8:00 AM-8:25 AM   Participation Level: Active   Behavioral Response: CasualAlertImprovedMood   Type of Therapy: Individual Therapy   Treatment Goals addressed: Coping   Interventions: CBT, Solution Focused, Strength-based, Supportive and Social Skills Training   Summary: Dale Bender is a 52 y.o. male who presents with Depression and Anxiety . The OPT therapist worked with the patient for his treatment. The OPT therapist utilized Motivational Interviewing to assist in creating therapeutic repore. The patient in the session was engaged and work in collaboration giving feedback about his triggers and symptoms over the past few weeks. The patient spoke about getting pain patches for his chronic back pain and this  helping him over the past few weeks manage the pain. The patient spoke about his med management and feeling this is helping manage his symptoms.The OPT therapist utilized Cognitive Behavioral Therapy through cognitive restructuring as well as worked with the patient on coping strategies to assist in management of mood, improve coping, and improve positive thinking while empowering the patient to be active within his health limits.The OPT therapist examined upcoming health appointments and continued to encourage the patient to stay consistent and compliant with all medical appointments and recommendations.The patient in this session noted he has been able to be more active with his chronic back pain being  managed by pain patch allowing him to be active. The patient spoke about plans for his birthday in August doing a cookout with family.The OPT therapist worked with the patient on mindfulness around his stressors and frustration levels and worked in session on in my control out of my control exercise.    Suicidal/Homicidal: Nowithout intent/plan   Therapist Response: The OPT therapist worked with the patient for the patients  scheduled session. The patient was engaged in his session and gave feedback in relation to triggers, symptoms, and behavior responses over the past few weeks. The OPT therapist worked with the patient utilizing an in session Cognitive Behavioral Therapy exercise. The patient was responsive in the session and verbalized, "Things have been going better since I started using these pain patches for my back so now I can move and bend more and it doesn't hurt as much ". The OPT therapist worked with the patient on setting realistic goals/ low hanging fruit to allow the patient some feeling of success in completing tasks and to help improve his mood. The OPT therapist overviewed mindfulness and the patient has been doing well with managing his stressors with down time leisure's including walking,fishing, and involvement with his church.     Plan:  Patient will return in 2/3 weeks   Diagnosis:      Axis I: Recurrent Depression with Anxiety                         Axis II: No diagnosis   Collaboration of Care: Overview of the patients involvement with med management program with prescriber Dr. Vanetta Shawl   Patient/Guardian was advised Release of Information must  be obtained prior to any record release in order to collaborate their care with an outside provider. Patient/Guardian was advised if they have not already done so to contact the registration department to sign all necessary forms in order for [name] to release information regarding their care.    Consent: Patient/Guardian gives verbal  consent for treatment and assignment of benefits for services provided during this visit. Patient/Guardian expressed understanding and agreed to proceed.      I discussed the assessment and treatment plan with the patient. The patient was provided an opportunity to ask questions and all were answered. The patient agreed with the plan and demonstrated an understanding of the instructions.   The patient was advised to call back or seek an in-person evaluation if the symptoms worsen or if the condition fails to improve as anticipated.   I provided 25 minutes of non-face-to-face time during this encounter.   [name], LCSW   03/03/2023

## 2023-03-04 NOTE — Telephone Encounter (Signed)
Called [facility] spoke to T[place] she stated that she would cancel the fluoxetine orders

## 2023-03-13 DIAGNOSIS — M5116 Intervertebral disc disorders with radiculopathy, lumbar region: Secondary | ICD-10-CM | POA: Diagnosis not present

## 2023-03-13 DIAGNOSIS — R109 Unspecified abdominal pain: Secondary | ICD-10-CM | POA: Diagnosis not present

## 2023-03-13 DIAGNOSIS — Z6834 Body mass index (BMI) 34.0-34.9, adult: Secondary | ICD-10-CM | POA: Diagnosis not present

## 2023-03-13 DIAGNOSIS — R030 Elevated blood-pressure reading, without diagnosis of hypertension: Secondary | ICD-10-CM | POA: Diagnosis not present

## 2023-03-13 DIAGNOSIS — F331 Major depressive disorder, recurrent, moderate: Secondary | ICD-10-CM | POA: Diagnosis not present

## 2023-03-13 DIAGNOSIS — Z1389 Encounter for screening for other disorder: Secondary | ICD-10-CM | POA: Diagnosis not present

## 2023-03-13 DIAGNOSIS — Z1331 Encounter for screening for depression: Secondary | ICD-10-CM | POA: Diagnosis not present

## 2023-03-17 DIAGNOSIS — Z79899 Other long term (current) drug therapy: Secondary | ICD-10-CM | POA: Diagnosis not present

## 2023-03-17 DIAGNOSIS — M549 Dorsalgia, unspecified: Secondary | ICD-10-CM | POA: Diagnosis not present

## 2023-03-17 DIAGNOSIS — M5116 Intervertebral disc disorders with radiculopathy, lumbar region: Secondary | ICD-10-CM | POA: Diagnosis not present

## 2023-03-17 DIAGNOSIS — G894 Chronic pain syndrome: Secondary | ICD-10-CM | POA: Diagnosis not present

## 2023-03-17 DIAGNOSIS — Z79891 Long term (current) use of opiate analgesic: Secondary | ICD-10-CM | POA: Diagnosis not present

## 2023-03-21 IMAGING — US US ABDOMEN LIMITED
1 series · 15 of 25 positions shown · non-contrast
Comparison: None Available.

CLINICAL DATA: Fatty liver, right upper quadrant pain

EXAM:
ULTRASOUND ABDOMEN LIMITED RIGHT UPPER QUADRANT

[Series 1: us abdomen limited ruq mc & wl · 15 of 47 slices shown]
[im 1/47]
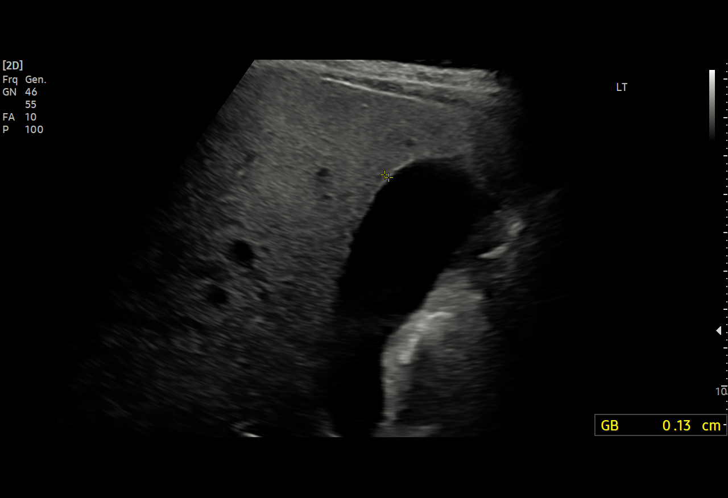
[im 4/47]
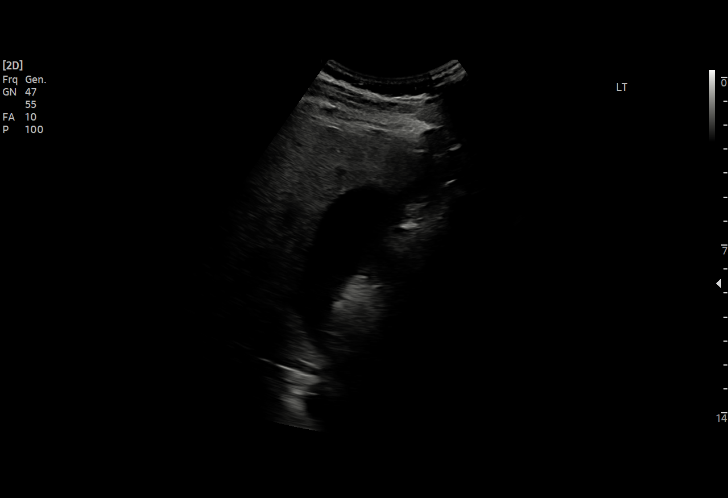
[im 8/47]
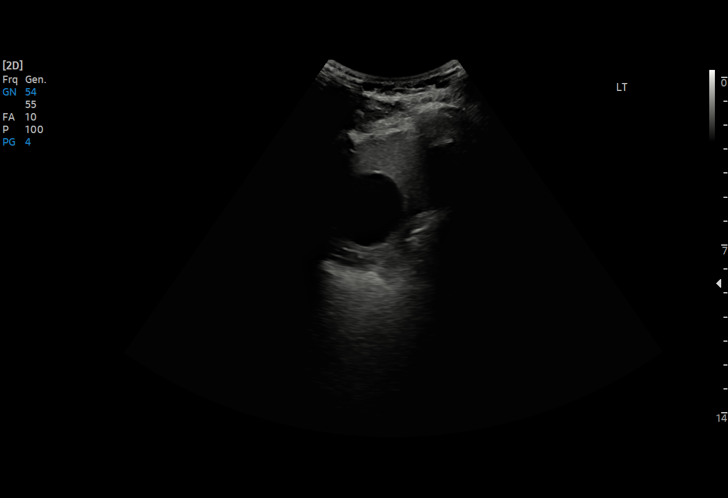
[im 10/47]
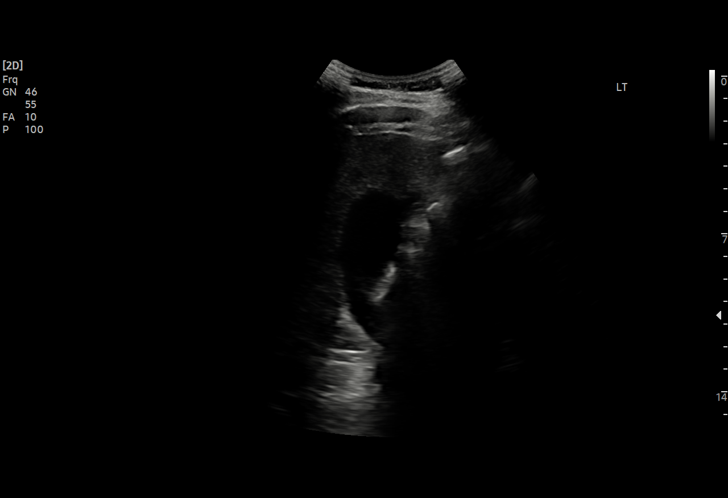
[im 14/47]
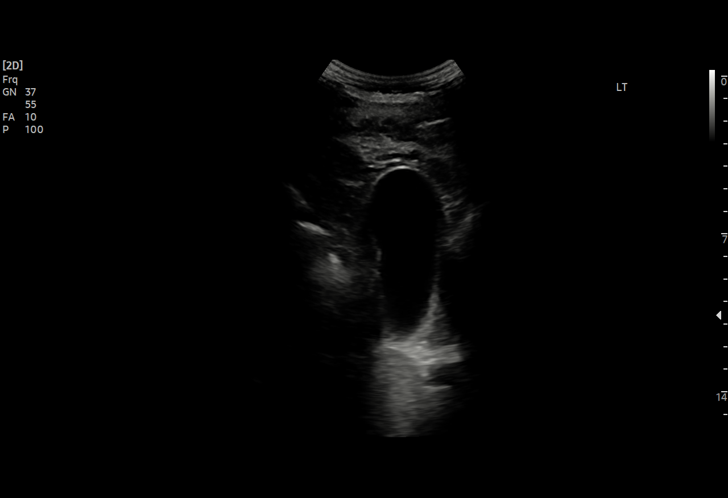
[im 18/47]
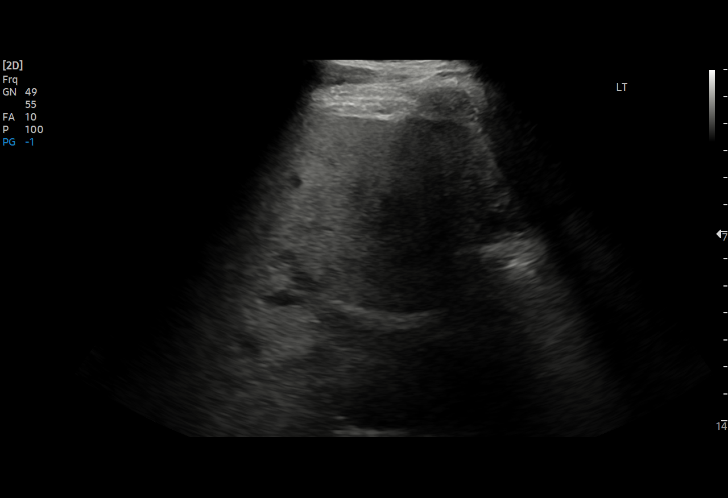
[im 20/47]
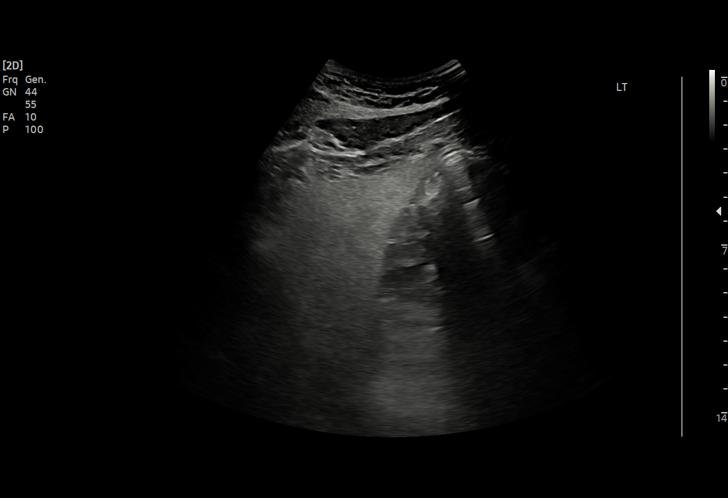
[im 24/47]
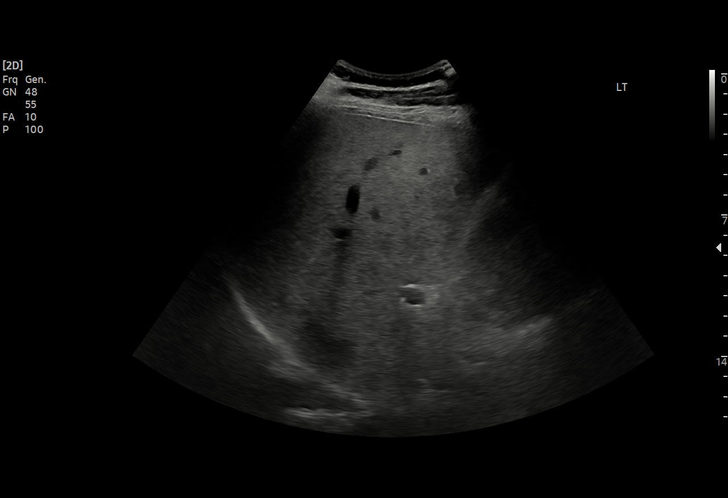
[im 27/47]
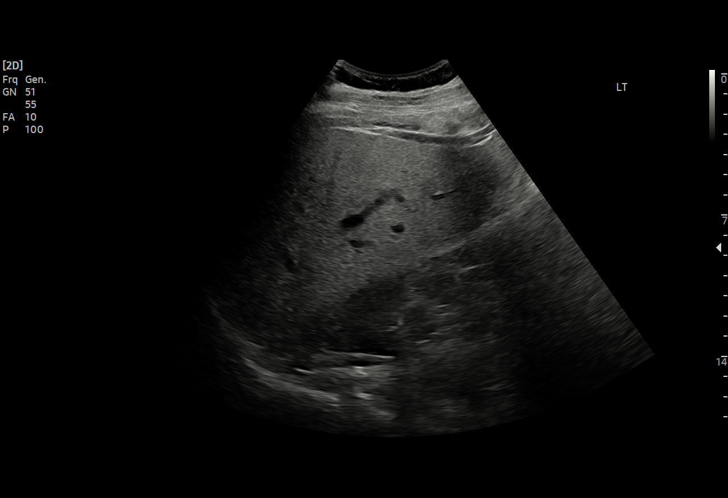
[im 29/47]
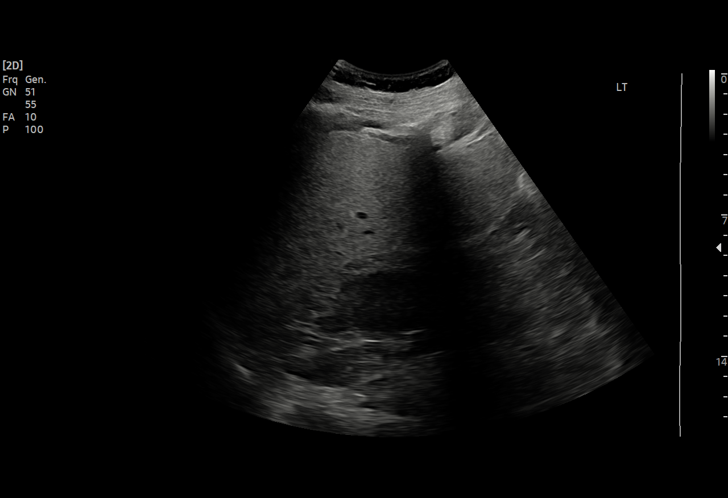
[im 33/47]
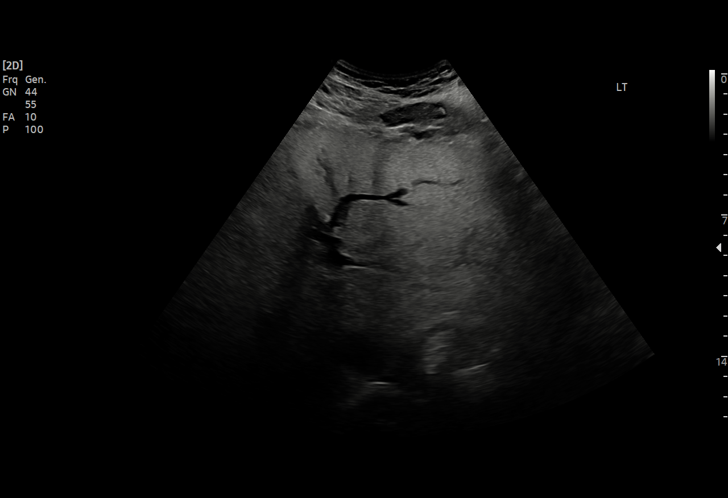
[im 37/47]
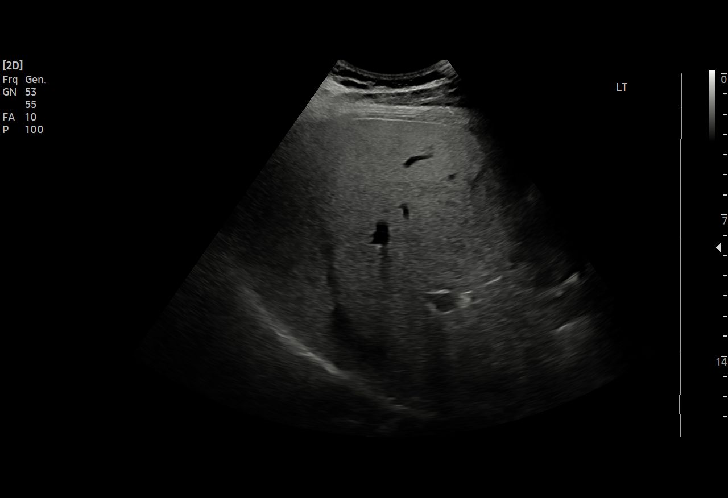
[im 39/47]
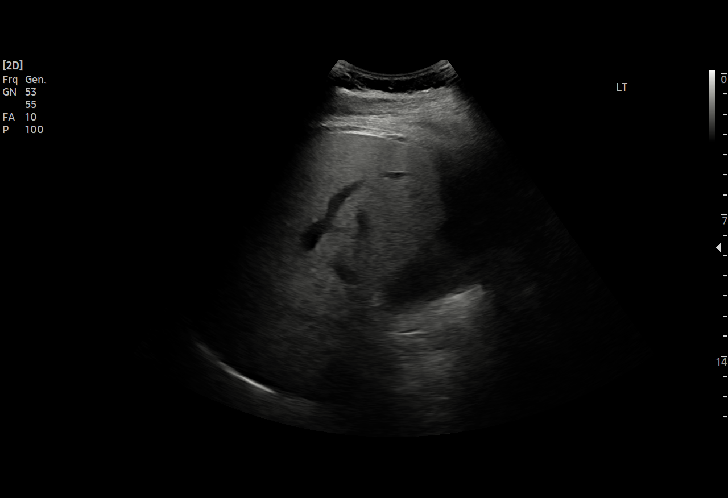
[im 43/47]
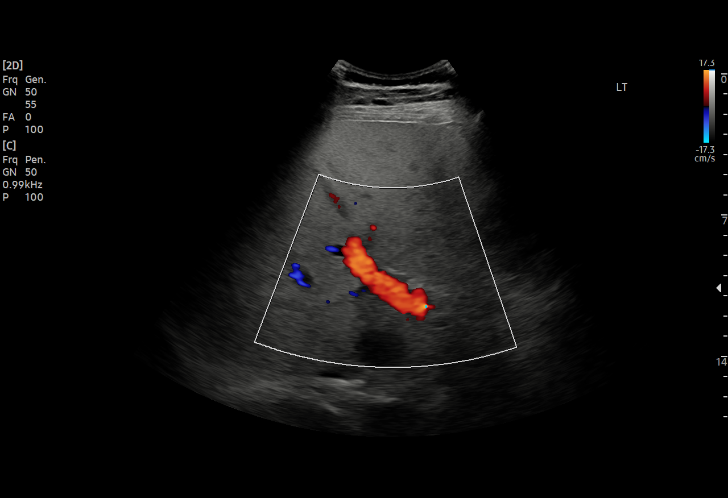
[im 47/47]
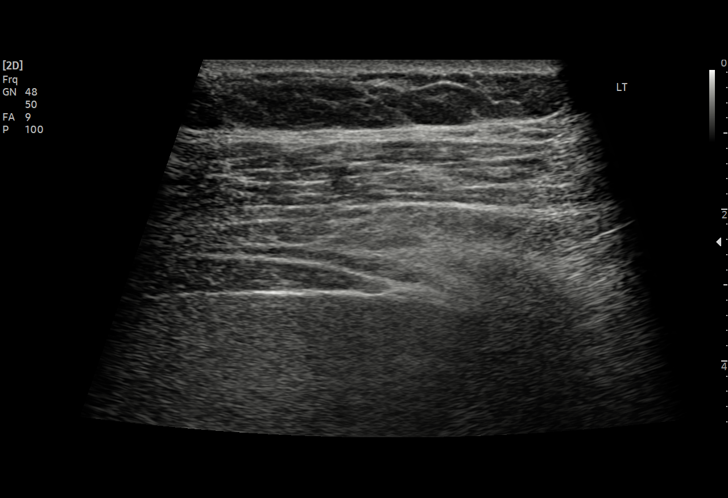

[15 of 25 positions shown; findings below may reference images not displayed]

FINDINGS: Gallbladder:

No gallstones or wall thickening visualized. No sonographic Murphy
sign noted by sonographer.

Common bile duct:

Diameter: 3 mm

Liver:

Diffusely increased echogenicity of the parenchyma with no focal
mass identified. Portal vein is patent on color Doppler imaging with
normal direction of blood flow towards the liver.

Other: None.
IMPRESSION: Diffuse increased echogenicity of the liver parenchyma likely
representing hepatic steatosis and/or other hepatocellular disease.

## 2023-03-28 ENCOUNTER — Telehealth: Payer: Medicare HMO | Admitting: Psychiatry

## 2023-03-31 ENCOUNTER — Ambulatory Visit (INDEPENDENT_AMBULATORY_CARE_PROVIDER_SITE_OTHER): Payer: Medicare HMO | Admitting: Clinical

## 2023-03-31 DIAGNOSIS — F339 Major depressive disorder, recurrent, unspecified: Secondary | ICD-10-CM

## 2023-03-31 DIAGNOSIS — F331 Major depressive disorder, recurrent, moderate: Secondary | ICD-10-CM

## 2023-03-31 DIAGNOSIS — F419 Anxiety disorder, unspecified: Secondary | ICD-10-CM | POA: Diagnosis not present

## 2023-03-31 NOTE — Progress Notes (Signed)
Virtual Visit via Video Note   I connected with Dale Bender on 03/31/23 at  8:00 AM EDT by a video enabled telemedicine application and verified that I am speaking with the correct person using two identifiers.   Location: Patient: Home Provider: Office   I discussed the limitations of evaluation and management by telemedicine and the availability of in person appointments. The patient expressed understanding and agreed to proceed.     THERAPIST PROGRESS NOTE   Session Time: 8:00 AM-8:25 AM   Participation Level: Active   Behavioral Response: CasualAlertImprovedMood   Type of Therapy: Individual Therapy   Treatment Goals addressed: Coping   Interventions: CBT, Solution Focused, Strength-based, Supportive and Social Skills Training   Summary: Dale Bender is a 52 y.o. male who presents with Depression and Anxiety . The OPT therapist worked with the patient for his treatment. The OPT therapist utilized Motivational Interviewing to assist in creating therapeutic repore. The patient in the session was engaged and work in collaboration giving feedback about his triggers and symptoms over the past few weeks. The patient spoke about ongoing benefit from using pain patches for his chronic back pain and this  continuing to help him over the past few weeks manage the pain and to be more active/mobile. The patient spoke about his med management and feeling this is helping manage his symptoms and overviewed upcoming med therapy appointment with prescriber psychiatrist Dr. Vanetta Shawl.The OPT therapist utilized Cognitive Behavioral Therapy through cognitive restructuring as well as worked with the patient on coping strategies to assist in management of mood, improve coping, and improve positive thinking while empowering the patient to be active within his health limits.The OPT therapist examined upcoming health appointments and continued to encourage the patient to stay consistent and compliant with  all medical appointments and recommendations.The patient in this session noted he has been able to be more active with his chronic back pain being managed by pain patch allowing him to be active with his church involvement and family. The patient spoke about his recent birthday and spending time with family as well as  plans for celebrating his wifes birthday at the end of the week.The OPT therapist worked with the patient on mindfulness around his stressors and frustration levels and worked in session on in my control out of my control exercise. The patient overviewed change of season activities including fishing and walking trails as part of change of environment and leisure.   Suicidal/Homicidal: Nowithout intent/plan   Therapist Response: The OPT therapist worked with the patient for the patients  scheduled session. The patient was engaged in his session and gave feedback in relation to triggers, symptoms, and behavior responses over the past few weeks. The OPT therapist worked with the patient utilizing an in session Cognitive Behavioral Therapy exercise. The patient was responsive in the session and verbalized, "Things are going well I had a good week last week and we celebrated and went out for my birthday and we attended the 2 sessions at our church yesterday. I am still using  these pain patches for my back and I can tell it really is helping ". The OPT therapist worked with the patient on setting realistic goals/ low hanging fruit to allow the patient some feeling of success in completing tasks and to help improve his mood. The OPT therapist overviewed mindfulness and the patient has been doing well with managing his stressors with down time leisure's including walking,fishing, and involvement with his church. The patient  identified looking forward to taking his [place] out in [place] and celebrating her upcoming birthday later this week. Additionally the patient spoke about plans to go to the beach  in the upcoming Fall in October.     Plan:  Patient will return in 2/3 weeks   Diagnosis:      Axis I: Recurrent Depression with Anxiety                         Axis II: No diagnosis   Collaboration of Care: Overview of the patients involvement with med management program with prescriber Dr. Vanetta Shawl   Patient/Guardian was advised Release of Information must be obtained prior to any record release in order to collaborate their care with an outside provider. Patient/Guardian was advised if they have not already done so to contact the registration department to sign all necessary forms in order for [name] to release information regarding their care.    Consent: Patient/Guardian gives verbal consent for treatment and assignment of benefits for services provided during this visit. Patient/Guardian expressed understanding and agreed to proceed.      I discussed the assessment and treatment plan with the patient. The patient was provided an opportunity to ask questions and all were answered. The patient agreed with the plan and demonstrated an understanding of the instructions.   The patient was advised to call back or seek an in-person evaluation if the symptoms worsen or if the condition fails to improve as anticipated.   I provided 25 minutes of non-face-to-face time during this encounter.   [name], LCSW   03/31/2023

## 2023-04-06 NOTE — Progress Notes (Unsigned)
Virtual Visit via Video Note  I connected with Dale Bender on 04/09/23 at  9:00 AM EDT by a video enabled telemedicine application and verified that I am speaking with the correct person using two identifiers.  Location: Patient: home Provider: office Persons participated in the visit- patient, provider    I discussed the limitations of evaluation and management by telemedicine and the availability of in person appointments. The patient expressed understanding and agreed to proceed.  I discussed the assessment and treatment plan with the patient. The patient was provided an opportunity to ask questions and all were answered. The patient agreed with the plan and demonstrated an understanding of the instructions.   The patient was advised to call back or seek an in-person evaluation if the symptoms worsen or if the condition fails to improve as anticipated.  I provided 20 minutes of non-face-to-face time during this encounter.   Neysa Hotter, MD     [facility] MD/PA/NP OP Progress Note  04/09/2023 9:09 AM Dale Bender  MRN:  [id number]  Chief Complaint:  Chief Complaint  Patient presents with   Follow-up   HPI:  This is a follow-up appointment for depression.  He states that he has been doing well on venlafaxine.  His wife did not want to switch to other medication as he was not doing well when the medication was adjusted previously.  When he was asked about the drowsiness he complained, he thinks that it might be due to lamotrigine or omeprazole, although he does not think it is coming from venlafaxine anymore.  He was informed that lamotrigine or Natalia Leatherwood can cause drowsiness, although it is less common that omeprazole is causing this.  He takes a walk in the morning, and reads Bible.  He is hoping to do more at the church for whatever things he can do now that his children are going to school.  He reports fair relationship with them, and he enjoyed going out with his family on his  birthday.  He denies feeling depressed.  He denies change in appetite.  He denies SI.  He has not had any seizure-like activity since the last visit.  Although he still experiences pain, medication helps to cut edges off and it has been tolerable.  He denies alcohol use or drug use.   Daily routine: stays in the house most of the time, goes to church on Wed, Sundays Employment: on long term disability from his job, (currently waiting for decision from the state). He used to works at [employer] for 13 years Support: Adela Lank Household: wife, his children Marital status: married  Number of children: 33 (25, 38 year old son/daughter)  Visit Diagnosis:    ICD-10-CM   1. MDD (major depressive disorder), recurrent, in partial remission (HCC)  F33.41       Past Psychiatric History: Please see initial evaluation for full details. I have reviewed the history. No updates at this time.     Past Medical History:  Past Medical History:  Diagnosis Date   Anxiety    Arthritis    Back pain    Depression, major, recurrent, moderate (HCC)    GERD (gastroesophageal reflux disease)    History of seizures    Lower extremity weakness    Prostate nodule    Benign   Sleep apnea    Suicidal ideation    Umbilical hernia    Urethral stricture in male     Past Surgical History:  Procedure Laterality Date  FRACTURE SURGERY     LT arm, LT leg, Rt leg   PROSTATE BIOPSY     UMBILICAL HERNIA REPAIR N/A 12/03/2019   Procedure: HERNIA REPAIR UMBILICAL ADULT/ WITH MESH;  Surgeon: Franky Macho, MD;  Location: [facility];  Service: General;  Laterality: N/A;   URETHRAL STRICTURE DILATATION      Family Psychiatric History: Please see initial evaluation for full details. I have reviewed the history. No updates at this time.     Family History:  Family History  Problem Relation Age of Onset   Hypertension Mother    Diabetes Mother    Other Father        died at age 36 in accident - log fell on him   Sleep apnea  Sister    Kidney cancer Maternal Aunt    Prostate cancer Paternal Uncle    Colon cancer Neg Hx    Esophageal cancer Neg Hx    Liver cancer Neg Hx    Pancreatic cancer Neg Hx     Social History:  Social History   Socioeconomic History   Marital status: Married    Spouse name: Not on file   Number of children: 2   Years of education: 10th grade   Highest education level: Not on file  Occupational History   Occupation: Disability  Tobacco Use   Smoking status: Former    Current packs/day: 0.00    Average packs/day: 0.5 packs/day for 10.0 years (5.0 ttl pk-yrs)    Types: Cigarettes    Start date: 11/30/1992    Quit date: 12/01/2002    Years since quitting: 20.3   Smokeless tobacco: Never  Vaping Use   Vaping status: Never Used  Substance and Sexual Activity   Alcohol use: Never   Drug use: Never   Sexual activity: Not on file  Other Topics Concern   Not on file  Social History Narrative   Lives at home with his wife and children.   Right-handed.   2 cups caffeine per day.   Social Determinants of Health   Financial Resource Strain: Not on file  Food Insecurity: Not on file  Transportation Needs: Not on file  Physical Activity: Not on file  Stress: Not on file  Social Connections: Not on file    Allergies: No Known Allergies  Metabolic Disorder Labs: No results found for: "HGBA1C", "MPG" No results found for: "PROLACTIN" Lab Results  Component Value Date   TRIG 187 (H) 08/05/2019   Lab Results  Component Value Date   TSH 1.920 11/02/2020   TSH 2.430 06/10/2019    Therapeutic Level Labs: No results found for: "LITHIUM" No results found for: "VALPROATE" No results found for: "CBMZ"  Current Medications: Current Outpatient Medications  Medication Sig Dispense Refill   albuterol (VENTOLIN HFA) 108 (90 Base) MCG/ACT inhaler Inhale 2 puffs into the lungs every 6 (six) hours as needed for wheezing or shortness of breath. 8 g 0   buprenorphine (BUTRANS) 10  MCG/HR PTWK 1 patch once a week.     guaiFENesin-dextromethorphan (ROBITUSSIN DM) 100-10 MG/5ML syrup Take 5-10 mLs by mouth every 4 (four) hours as needed for cough. 118 mL 0   LamoTRIgine 200 MG TB24 24 hour tablet Take 2 tablets (400 mg total) by mouth at bedtime. 180 tablet 1   omeprazole (PRILOSEC) 40 MG capsule Take 40 mg by mouth daily.     [START ON 05/02/2023] venlafaxine XR (EFFEXOR-XR) 75 MG 24 hr capsule Take 1 capsule (75 mg  total) by mouth daily with breakfast. 30 capsule 2   ZTLIDO 1.8 % PTCH Apply 1 patch topically 3 (three) times daily.     No current facility-administered medications for this visit.     Musculoskeletal: Strength & Muscle Tone:  N/A Gait & Station:  N/A Patient leans: N/A  Psychiatric Specialty Exam: Review of Systems  Psychiatric/Behavioral:  Negative for agitation, behavioral problems, confusion, decreased concentration, dysphoric mood, hallucinations, self-injury, sleep disturbance and suicidal ideas. The patient is nervous/anxious. The patient is not hyperactive.   All other systems reviewed and are negative.   There were no vitals taken for this visit.There is no height or weight on file to calculate BMI.  General Appearance: Fairly Groomed  Eye Contact:  Good  Speech:  Clear and Coherent  Volume:  Normal  Mood:   good  Affect:  Appropriate, Congruent, and calm  Thought Process:  Coherent  Orientation:  Full (Time, Place, and Person)  Thought Content: Logical   Suicidal Thoughts:  No  Homicidal Thoughts:  No  Memory:  Immediate;   Good  Judgement:  Good  Insight:  Good  Psychomotor Activity:  Normal  Concentration:  Concentration: Good and Attention Span: Good  Recall:  Good  Fund of Knowledge: Good  Language: Good  Akathisia:  No  Handed:  Right  AIMS (if indicated): not done  Assets:  Communication Skills Desire for Improvement  ADL's:  Intact  Cognition: WNL  Sleep:  Fair   Screenings: GAD-7    Advertising copywriter from  12/12/2022 in [facility] [place] Counselor from 12/06/2021 in [facility] [place]  Total GAD-7 Score 18 18      PHQ2-9    Flowsheet Row Counselor from 12/12/2022 in [facility] [place] Office Visit from 01/01/2022 in [facility] Counselor from 12/06/2021 in [facility] [place] Video Visit from 08/30/2021 in [facility] Video Visit from 06/01/2021 in [facility]  PHQ-2 Total Score 2 0 4 4 2   PHQ-9 Total Score 11 2 20 10 5       Flowsheet Row Counselor from 12/12/2022 in [facility] [place] ED to Hosp-Admission (Discharged) from 04/03/2022 in [facility] Office Visit from 01/01/2022 in [facility]  C-SSRS RISK CATEGORY No Risk No Risk Low Risk        Assessment and Plan:  Dale Bender is a 52 y.o. year old male with a history of depression, complex partial seizure, obstructive sleep apnea on CPAP, who presents for follow up appointment for below.   1. MDD (major depressive disorder), recurrent episode, in partial remission (HCC) Acute stressors include: loss of his aunt Other stressors include:  seizure disorder, marital conflict, right leg weakness   History:  admitted to [facility] in March 2023 after SA of jumping from a moving car/overdosing of muscle relaxants    There has been overall improvement in depressive symptoms, irritability since the last visit.  Although he previously reported drowsiness due to venlafaxine, he denies this at this time, stating that other medication is causing this.  Although the plan was to switch to fluoxetine, he now prefers to stay on the current dose of venlafaxine while he is not interested in uptitration  either.  Will continue current dose at this time given he has started to  engage well with therapy.   Plan Continue venlafaxine 75 mg daily  Hold fluoxetine Next appointment- 11/20 at 8 am, video (he declined in person due to lack of transportation) - on buprenorphine patch  - Reviewed TSH ; wnl   Past trials of medication: sertraline, venlafaxine (drowsiness), Abilify (irritability, nausea)   The patient demonstrates the following risk factors for suicide: Chronic risk factors for suicide include: psychiatric disorder of depression. Acute risk factors for suicide include: recent suicide attempt, loss (financial, interpersonal, professional). Protective factors for this patient include: positive social support, responsibility to others (children, family), coping skills and hope for the future. Considering these factors, the overall suicide risk at this point appears to be moderate, but not at imminent risk. Patient is appropriate for outpatient follow up. Emergency resources which includes 911, ED, suicide crisis line [phone]) are discussed.    Collaboration of Care: Collaboration of Care: Other reviewed notes in Epic  Patient/Guardian was advised Release of Information must be obtained prior to any record release in order to collaborate their care with an outside provider. Patient/Guardian was advised if they have not already done so to contact the registration department to sign all necessary forms in order for [name] to release information regarding their care.   Consent: Patient/Guardian gives verbal consent for treatment and assignment of benefits for services provided during this visit. Patient/Guardian expressed understanding and agreed to proceed.    Neysa Hotter, MD 04/09/2023, 9:09 AM

## 2023-04-09 ENCOUNTER — Encounter: Payer: Self-pay | Admitting: Psychiatry

## 2023-04-09 ENCOUNTER — Telehealth (INDEPENDENT_AMBULATORY_CARE_PROVIDER_SITE_OTHER): Payer: Medicare HMO | Admitting: Psychiatry

## 2023-04-09 DIAGNOSIS — F3341 Major depressive disorder, recurrent, in partial remission: Secondary | ICD-10-CM | POA: Diagnosis not present

## 2023-04-09 MED ORDER — VENLAFAXINE HCL ER 75 MG PO CP24: 75.0000 mg | ORAL_CAPSULE | Freq: Every day | ORAL | 2 refills | Status: DC

## 2023-04-09 NOTE — Patient Instructions (Signed)
Continue venlafaxine 75 mg daily  Hold fluoxetine Next appointment- 11/20 at 8 am

## 2023-05-05 ENCOUNTER — Ambulatory Visit (INDEPENDENT_AMBULATORY_CARE_PROVIDER_SITE_OTHER): Payer: Medicare HMO | Admitting: Clinical

## 2023-05-05 DIAGNOSIS — F419 Anxiety disorder, unspecified: Secondary | ICD-10-CM

## 2023-05-05 DIAGNOSIS — F331 Major depressive disorder, recurrent, moderate: Secondary | ICD-10-CM | POA: Diagnosis not present

## 2023-05-05 NOTE — Progress Notes (Signed)
Virtual Visit via Video Note   I connected with Dale Bender on 05/05/23 at  8:00 AM EDT by a video enabled telemedicine application and verified that I am speaking with the correct person using two identifiers.   Location: Patient: Home Provider: Office   I discussed the limitations of evaluation and management by telemedicine and the availability of in person appointments. The patient expressed understanding and agreed to proceed.     THERAPIST PROGRESS NOTE   Session Time: 8:00 AM-8:30 AM   Participation Level: Active   Behavioral Response: CasualAlertImprovedMood   Type of Therapy: Individual Therapy   Treatment Goals addressed: Coping   Interventions: CBT, Solution Focused, Strength-based, Supportive and Social Skills Training   Summary: Dale Bender is a 52 y.o. male who presents with Depression and Anxiety . The OPT therapist worked with the patient for his treatment. The OPT therapist utilized Motivational Interviewing to assist in creating therapeutic repore. The patient in the session was engaged and work in collaboration giving feedback about his triggers and symptoms over the past few weeks. The patient spoke about electing to not change his med management  at his last appointment and feeling this is helping manage his symptoms as it currently stands..The OPT therapist utilized Cognitive Behavioral Therapy through cognitive restructuring as well as worked with the patient on coping strategies to assist in management of mood, improve coping, and improve positive thinking while empowering the patient to be active within his health limits.The OPT therapist examined upcoming health appointments and continued to encourage the patient to stay consistent and compliant with all medical appointments and recommendations.The patient in this session noted he has continued to be more active with his chronic back pain being managed by pain patch allowing him to be active with his  church involvement and family. The patient spoke about incorporating Fishing more to help as a coping strategy.The OPT therapist worked with the patient on mindfulness around his stressors and frustration levels and worked in session on in my control out of my control exercise.    Suicidal/Homicidal: Nowithout intent/plan   Therapist Response: The OPT therapist worked with the patient for the patients  scheduled session. The patient was engaged in his session and gave feedback in relation to triggers, symptoms, and behavior responses over the past few weeks. The OPT therapist worked with the patient utilizing an in session Cognitive Behavioral Therapy exercise. The patient was responsive in the session and verbalized, "Things are going well I have been involved more with events from the [place] and able to spend more time with my family and play with the kids ". The OPT therapist worked with the patient on setting realistic goals/ low hanging fruit to allow the patient some feeling of success in completing tasks and to help improve his mood. The OPT therapist overviewed mindfulness and the patient has been doing well with managing his stressors with down time leisure's including walking,fishing, and involvement with his church. Additionally the patient spoke about plans to go to the beach in the upcoming Fall in October and looking forward to Fishing from the [place].     Plan:  Patient will return in 2/3 weeks   Diagnosis:      Axis I: Recurrent Depression with Anxiety                         Axis II: No diagnosis   Collaboration of Care: Overview of the patients involvement with med  management program with prescriber Dr. Vanetta Shawl   Patient/Guardian was advised Release of Information must be obtained prior to any record release in order to collaborate their care with an outside provider. Patient/Guardian was advised if they have not already done so to contact the registration department to sign all necessary  forms in order for [name] to release information regarding their care.    Consent: Patient/Guardian gives verbal consent for treatment and assignment of benefits for services provided during this visit. Patient/Guardian expressed understanding and agreed to proceed.      I discussed the assessment and treatment plan with the patient. The patient was provided an opportunity to ask questions and all were answered. The patient agreed with the plan and demonstrated an understanding of the instructions.   The patient was advised to call back or seek an in-person evaluation if the symptoms worsen or if the condition fails to improve as anticipated.   I provided 30 minutes of non-face-to-face time during this encounter.   [name], LCSW   05/05/2023

## 2023-05-12 DIAGNOSIS — Z79891 Long term (current) use of opiate analgesic: Secondary | ICD-10-CM | POA: Diagnosis not present

## 2023-05-12 DIAGNOSIS — M5116 Intervertebral disc disorders with radiculopathy, lumbar region: Secondary | ICD-10-CM | POA: Diagnosis not present

## 2023-05-12 DIAGNOSIS — G894 Chronic pain syndrome: Secondary | ICD-10-CM | POA: Diagnosis not present

## 2023-05-12 DIAGNOSIS — M549 Dorsalgia, unspecified: Secondary | ICD-10-CM | POA: Diagnosis not present

## 2023-05-12 DIAGNOSIS — Z79899 Other long term (current) drug therapy: Secondary | ICD-10-CM | POA: Diagnosis not present

## 2023-05-21 DIAGNOSIS — K219 Gastro-esophageal reflux disease without esophagitis: Secondary | ICD-10-CM | POA: Diagnosis not present

## 2023-05-21 DIAGNOSIS — F32A Depression, unspecified: Secondary | ICD-10-CM | POA: Diagnosis not present

## 2023-05-21 DIAGNOSIS — Z7951 Long term (current) use of inhaled steroids: Secondary | ICD-10-CM | POA: Diagnosis not present

## 2023-05-21 DIAGNOSIS — R569 Unspecified convulsions: Secondary | ICD-10-CM | POA: Diagnosis not present

## 2023-05-21 DIAGNOSIS — Z79899 Other long term (current) drug therapy: Secondary | ICD-10-CM | POA: Diagnosis not present

## 2023-05-21 DIAGNOSIS — R112 Nausea with vomiting, unspecified: Secondary | ICD-10-CM | POA: Diagnosis not present

## 2023-05-21 DIAGNOSIS — R1010 Upper abdominal pain, unspecified: Secondary | ICD-10-CM | POA: Diagnosis not present

## 2023-05-21 DIAGNOSIS — R1111 Vomiting without nausea: Secondary | ICD-10-CM | POA: Diagnosis not present

## 2023-05-21 DIAGNOSIS — R42 Dizziness and giddiness: Secondary | ICD-10-CM | POA: Diagnosis not present

## 2023-05-21 DIAGNOSIS — R0902 Hypoxemia: Secondary | ICD-10-CM | POA: Diagnosis not present

## 2023-06-02 ENCOUNTER — Ambulatory Visit (INDEPENDENT_AMBULATORY_CARE_PROVIDER_SITE_OTHER): Payer: Medicare HMO | Admitting: Clinical

## 2023-06-02 DIAGNOSIS — F419 Anxiety disorder, unspecified: Secondary | ICD-10-CM

## 2023-06-02 DIAGNOSIS — F339 Major depressive disorder, recurrent, unspecified: Secondary | ICD-10-CM

## 2023-06-02 DIAGNOSIS — F338 Other recurrent depressive disorders: Secondary | ICD-10-CM

## 2023-06-02 DIAGNOSIS — F331 Major depressive disorder, recurrent, moderate: Secondary | ICD-10-CM

## 2023-06-02 NOTE — Progress Notes (Signed)
Virtual Visit via Video Note   I connected with Dale Bender on 06/02/23 at  8:00 AM EDT by a video enabled telemedicine application and verified that I am speaking with the correct person using two identifiers.   Location: Patient: Home Provider: Office   I discussed the limitations of evaluation and management by telemedicine and the availability of in person appointments. The patient expressed understanding and agreed to proceed.     THERAPIST PROGRESS NOTE   Session Time: 8:00 AM-8:30 AM   Participation Level: Active   Behavioral Response: CasualAlertImprovedMood   Type of Therapy: Individual Therapy   Treatment Goals addressed: Coping   Interventions: CBT, Solution Focused, Strength-based, Supportive and Social Skills Training   Summary: Dale Bender. Wilner is a 52 y.o. male who presents with Depression and Anxiety . The OPT therapist worked with the patient for his treatment. The OPT therapist utilized Motivational Interviewing to assist in creating therapeutic repore. The patient in the session was engaged and work in collaboration giving feedback about his triggers and symptoms over the past few weeks. The patient spoke about a change with his pain medication at the beginning of October being to strong for him creating severe nausea and causing him to go to the ER. Since the ER visit at the beginning of October the patient recovered went back to using pain patches he had before the medicine switch and was able to go on vacation to [facility] last week.The OPT therapist utilized Cognitive Behavioral Therapy through cognitive restructuring as well as worked with the patient on coping strategies to assist in management of mood, improve coping, and improve positive thinking while empowering the patient to be active within his health limits.The OPT therapist examined upcoming health appointments and continued to encourage the patient to stay consistent and compliant with all medical  appointments and recommendations.The patient in this session noted he has continued to be more active with his chronic back pain being managed by pain patch allowing him to be active with his church involvement which he went with and goes with each year to [facility] last week. The patient spoke about incorporating Fishing more to help as a coping strategy and with warm weather predicted in the upcoming week plans to use this coping strategy.The OPT therapist worked with the patient on mindfulness around his stressors and frustration levels and worked in session on in my control out of my control exercise.    Suicidal/Homicidal: Nowithout intent/plan   Therapist Response: The OPT therapist worked with the patient for the patients  scheduled session. The patient was engaged in his session and gave feedback in relation to triggers, symptoms, and behavior responses over the past few weeks. The OPT therapist worked with the patient utilizing an in session Cognitive Behavioral Therapy exercise. The patient was responsive in the session and verbalized, "Things are going better I did have to go to the hospital because I was getting sick and I knew it was from a pain medicine change, but they gave me medicine at the ER and that stopped the naseua and I switched back to my pain patches and that's been working and I went this last week Thursday-Sunday with my church we go every year to [facility] and I was able to get around and walked alot ". The OPT therapist worked with the patient on setting realistic goals/ low hanging fruit to allow the patient some feeling of success in completing tasks and to help improve his mood. The  OPT therapist overviewed mindfulness and the patient has been doing well with managing his stressors with down time leisure's including walking,fishing, and involvement with his church.  The patient has shown consistency with MH baseline and planned with the OPT therapist for potential Discharge  in November.   Plan:  Patient will return in 2/3 weeks   Diagnosis:      Axis I: Recurrent Depression with Anxiety                         Axis II: No diagnosis   Collaboration of Care: Overview of the patients involvement with med management program with prescriber Dr. Vanetta Shawl   Patient/Guardian was advised Release of Information must be obtained prior to any record release in order to collaborate their care with an outside provider. Patient/Guardian was advised if they have not already done so to contact the registration department to sign all necessary forms in order for [name] to release information regarding their care.    Consent: Patient/Guardian gives verbal consent for treatment and assignment of benefits for services provided during this visit. Patient/Guardian expressed understanding and agreed to proceed.      I discussed the assessment and treatment plan with the patient. The patient was provided an opportunity to ask questions and all were answered. The patient agreed with the plan and demonstrated an understanding of the instructions.   The patient was advised to call back or seek an in-person evaluation if the symptoms worsen or if the condition fails to improve as anticipated.   I provided 30 minutes of non-face-to-face time during this encounter.   [name], LCSW   06/02/2023

## 2023-06-17 ENCOUNTER — Encounter: Payer: Self-pay | Admitting: Neurology

## 2023-06-17 ENCOUNTER — Ambulatory Visit (INDEPENDENT_AMBULATORY_CARE_PROVIDER_SITE_OTHER): Payer: Medicare HMO | Admitting: Clinical

## 2023-06-17 DIAGNOSIS — F331 Major depressive disorder, recurrent, moderate: Secondary | ICD-10-CM

## 2023-06-17 DIAGNOSIS — F419 Anxiety disorder, unspecified: Secondary | ICD-10-CM

## 2023-06-17 NOTE — Progress Notes (Signed)
Virtual Visit via Video Note   I connected with Dale Bender on 06/17/23 at  8:00 AM EDT by a video enabled telemedicine application and verified that I am speaking with the correct person using two identifiers.   Location: Patient: Home Provider: Office   I discussed the limitations of evaluation and management by telemedicine and the availability of in person appointments. The patient expressed understanding and agreed to proceed.     THERAPIST PROGRESS NOTE   Session Time: 8:00 AM-8:25 AM   Participation Level: Active   Behavioral Response: CasualAlertImprovedMood   Type of Therapy: Individual Therapy   Treatment Goals addressed: Coping   Interventions: CBT, Solution Focused, Strength-based, Supportive and Social Skills Training   Summary: Dale Bender is a 52 y.o. male who presents with Depression and Anxiety . The OPT therapist worked with the patient for his treatment. The OPT therapist utilized Motivational Interviewing to assist in creating therapeutic repore. The patient in the session was engaged and work in collaboration giving feedback about his triggers and symptoms over the past few weeks. The patient spoke about  his med management and feeling this has continued to help manage his symptoms and overviewed upcoming med therapy appointment with prescriber psychiatrist Dr. Vanetta Shawl.The OPT therapist utilized Cognitive Behavioral Therapy through cognitive restructuring as well as worked with the patient on coping strategies to assist in management of mood, improve coping, and improve positive thinking while empowering the patient to be active within his health limits.The OPT therapist examined upcoming health appointments and continued to encourage the patient to stay consistent and compliant with all medical appointments and recommendations.The patient in this session noted he has been able to be more active with his church involvement and family noting looking forward to  several church related events coming up in the upcoming weekend. The patient spoke about looking forward to upcoming holidays and spending time with his family.The OPT therapist worked with the patient on mindfulness around his stressors and frustration levels and worked in session on in my control out of my control exercise. The patient and OPT agree at this time the patient has been able to meet his treatment goals with consistency and is ready to successfully discharge at this time.   Suicidal/Homicidal: Nowithout intent/plan   Therapist Response: The OPT therapist worked with the patient for the patients  scheduled session. The patient was engaged in his session and gave feedback in relation to triggers, symptoms, and behavior responses over the past few weeks. The OPT therapist worked with the patient utilizing an in session Cognitive Behavioral Therapy exercise. The patient was responsive in the session and verbalized, "Things are still going really good and we have been busy and looking forward to our gathering this weekend with the church its a 3 day event over the weekend". The OPT therapist worked with the patient on mindfulness and the patient has been doing well with managing his stressors with down time leisure's including walking,fishing, and involvement with his church. The patient identified looking forward to upcoming Thanksgiving and Christmas holidays and spending time with his family.  The patient has shown consistency in meeting his treatment goals and will be successfully discharged.   Plan:  Successful Discharge   Diagnosis:      Axis I: Recurrent Depression with Anxiety                         Axis II: No diagnosis   Collaboration of Care:  Overview of the patients involvement with med management program with prescriber Dr. Vanetta Shawl   Patient/Guardian was advised Release of Information must be obtained prior to any record release in order to collaborate their care with an outside  provider. Patient/Guardian was advised if they have not already done so to contact the registration department to sign all necessary forms in order for [name] to release information regarding their care.    Consent: Patient/Guardian gives verbal consent for treatment and assignment of benefits for services provided during this visit. Patient/Guardian expressed understanding and agreed to proceed.      I discussed the assessment and treatment plan with the patient. The patient was provided an opportunity to ask questions and all were answered. The patient agreed with the plan and demonstrated an understanding of the instructions.   The patient was advised to call back or seek an in-person evaluation if the symptoms worsen or if the condition fails to improve as anticipated.   I provided 25 minutes of non-face-to-face time during this encounter.   [name], LCSW   06/17/2023

## 2023-06-18 ENCOUNTER — Telehealth: Payer: Self-pay | Admitting: Neurology

## 2023-06-18 NOTE — Telephone Encounter (Signed)
Pt's wife called asking if can change office visit to a MyChart Video Visit. Do not have any neurological issues or medication issues. Pt asking for a call back to let her know if can be changed.

## 2023-06-18 NOTE — Telephone Encounter (Signed)
Phone room: Call and let pt know that this has been changed to mychart visit per the patient's request.  Thanks,  Khalea Ventura

## 2023-06-23 NOTE — Progress Notes (Unsigned)
Virtual Visit via Video Note  I connected with Dale Bender on 06/23/23 at  2:45 PM EST by a video enabled telemedicine application and verified that I am speaking with the correct person using two identifiers.  Location: Patient: at his home Provider: in the office    I discussed the limitations of evaluation and management by telemedicine and the availability of in person appointments. The patient expressed understanding and agreed to proceed.  History of Present Illness: June 24, 2023 SS: Here today via video visit for follow-up, BiPAP ST download looks very good.  He does mention that he feels like the mask is leaking, but his leak is 0.  He will then tighten his mask. BiPAP download 05/25/2023-06/26/23 shows usage 26/30 days at 87%, greater than 4 hours 83%.  Average usage days used 7 hours 6 minutes. IPAP 20 cm, EPAP 15 cm.  Leak is 0, AHI 1.5.  He continues with daytime sleepiness.  He is on disability.  He does drive.  No seizures reported.  Remains on Lamictal XR 200 mg, 2 tablets daily.  ESS was 12.  He is getting his BiPAP supplies from his DME.  I reviewed recent CBC, CMP from October 2024.   December 09, 2022 SS: BiPAP download is attached below, overall compliance looks fairly good, 77%.  AHI 1.3, leak 3.4. For seizures, remains on Lamictal XR 200 mg daily. Is using full face. He did travel in early April, but had run out of supplies, so he couldn't use. He has been getting his supplies from [place], but they don't fit as well as when purchased from DME. With the [facility] supplies after a few days the mask would split at the nose piece. With BIPAP, he sleeps better, no snoring. He does still have some lingering daytime sleepiness, sometimes naps. He is on disability. Claims mental health is doing well. Denies any seizures. Still on Lamictal XR 200 mg, 2 tablets at bedtime.  Takes Zoloft from psych. He can no longer afford to buy supplies from [employer]. Denies any health issues.  Lamictal level was 8.19 March 2022.     Dr. Frances Furbish 09/19/2020: I reviewed his BiPAP compliance data from 06/19/2021 through 07/18/2021, which is a total of 30 days, during which time he used his machine 29 days with percent use days greater than 4 hours at 93%, indicating excellent compliance with an average usage of 7 hours and 3 minutes, residual AHI at goal at 1.4/h, leak on the low side with a 95th percentile at 1.8 L/min, pressure of 20/15 centimeters with a rate of 68/min.  He reports still feeling sleepy during the day and fatigue.  Of note, he takes sertraline 200 mg at bedtime and Lamictal 200 mg at bedtime.  He is advised to talk to his psychiatrist about changing his medication potentially if it is feasible.  He does state that they tried a new medication recently and he only took it for 1 day and had a reaction to it including changes in mood and vomiting.  He is up-to-date with his BiPAP supplies.  He is able to tolerate the pressure.  He uses a full facemask.  He does have some residual leak and snoring but I explained to him that his leak is actually rather small when I look at the download, especially considering the fact that he uses a fullface mask and the pressure is quite high.  He reports that he does not currently drive.    Observations/Objective: Via  video visit, is alert and oriented, speech is clear and concise, follows exam commands, walks independently moves extremities well  Assessment and Plan: 1.  OSA and central sleep apnea on BiPAP ST -Overall, has very good compliance.  Will continue current settings.  Recommend continue nightly usage for minimum of 4 hours.  He will continue to get supplies through his DME. -He still has some residual daytime fatigue, likely is medication side effect  2.  Seizures -Denies any recent seizure events -EMU admission in February 2021 felt consistent with nonepileptic events, recommended continue Lamictal -Continue Lamictal XR 200 mg, 2  tablets at bedtime -I reviewed recent CBC, CMP  Meds ordered this encounter  Medications   LamoTRIgine 200 MG TB24 24 hour tablet    Sig: Take 2 tablets (400 mg total) by mouth at bedtime.    Dispense:  180 tablet    Refill:  3   Follow Up Instructions: 1 year video visit I discussed the assessment and treatment plan with the patient. The patient was provided an opportunity to ask questions and all were answered. The patient agreed with the plan and demonstrated an understanding of the instructions.   The patient was advised to call back or seek an in-person evaluation if the symptoms worsen or if the condition fails to improve as anticipated.  Otila Kluver, DNP  [facility] [address], [address] [place], [place] [address] [phone]

## 2023-06-24 ENCOUNTER — Telehealth: Payer: Medicare HMO | Admitting: Neurology

## 2023-06-24 DIAGNOSIS — N1831 Chronic kidney disease, stage 3a: Secondary | ICD-10-CM | POA: Diagnosis not present

## 2023-06-24 DIAGNOSIS — F445 Conversion disorder with seizures or convulsions: Secondary | ICD-10-CM

## 2023-06-24 DIAGNOSIS — E7849 Other hyperlipidemia: Secondary | ICD-10-CM | POA: Diagnosis not present

## 2023-06-24 DIAGNOSIS — R569 Unspecified convulsions: Secondary | ICD-10-CM | POA: Diagnosis not present

## 2023-06-24 DIAGNOSIS — G4733 Obstructive sleep apnea (adult) (pediatric): Secondary | ICD-10-CM

## 2023-06-24 DIAGNOSIS — E875 Hyperkalemia: Secondary | ICD-10-CM | POA: Diagnosis not present

## 2023-06-24 DIAGNOSIS — G40909 Epilepsy, unspecified, not intractable, without status epilepticus: Secondary | ICD-10-CM

## 2023-06-24 DIAGNOSIS — N400 Enlarged prostate without lower urinary tract symptoms: Secondary | ICD-10-CM | POA: Diagnosis not present

## 2023-06-24 MED ORDER — LAMOTRIGINE ER 200 MG PO TB24: 2.0000 | ORAL_TABLET | Freq: Every day | ORAL | 3 refills | Status: DC

## 2023-06-24 NOTE — Patient Instructions (Signed)
Please continue using BiPAP nightly.  Recommend minimum of 4 hours nightly usage.  Continue Lamictal for seizure prevention.  Please follow-up for any new or worsening symptoms.  Will see back in 1 year.  Thanks

## 2023-06-27 NOTE — Progress Notes (Unsigned)
Virtual Visit via Video Note  I connected with Dale Bender on 07/02/23 at  8:00 AM EST by a video enabled telemedicine application and verified that I am speaking with the correct person using two identifiers.  Location: Patient: home Provider: office Persons participated in the visit- patient, provider    I discussed the limitations of evaluation and management by telemedicine and the availability of in person appointments. The patient expressed understanding and agreed to proceed.    I discussed the assessment and treatment plan with the patient. The patient was provided an opportunity to ask questions and all were answered. The patient agreed with the plan and demonstrated an understanding of the instructions.   The patient was advised to call back or seek an in-person evaluation if the symptoms worsen or if the condition fails to improve as anticipated.  I provided 20 minutes of non-face-to-face time during this encounter.   Neysa Hotter, MD    [facility] MD/PA/NP OP Progress Note  07/02/2023 8:20 AM Dale Bender  MRN:  [id number]  Chief Complaint:  Chief Complaint  Patient presents with   Follow-up   HPI:  This is a follow-up appointment for depression.  According to the chart review, he went to the ED due to nausea after being prescribed buprenorphine.   He states that he has been doing well.  His mother-in-law at nursing home is getting palliative care.  They were told that she has 2 weeks.  He and his wife has been taking turns, visiting her.  He tries to be there for his wife, and reports fair relationship with her.  He is trying to see what comes next.  He has been involved in church activities.  They might be having Thanksgiving, although this might be canceled.  He states that he had to go to ED due to intractable vomiting after being placed on buprenorphine.  He will have primary care visit later today.  He feels anxious at times.  He has occasional insomnia due to  back pain.  He has fair appetite.  He denies SI.  He thinks everything has been going well, and graduated from therapy.  He agrees to reach out to him/this office if any concerns arise in the future.    Daily routine: stays in the house most of the time, goes to church on Wed, Sundays Employment: on long term disability from his job, (currently waiting for decision from the state). He used to works at [employer] for 13 years Support: Dale Bender Household: wife, his children Dale Bender  Number of children: 74 (71, 62 year old son/daughter)  Visit Diagnosis:    ICD-10-CM   1. MDD (major depressive disorder), recurrent, in partial remission (HCC)  F33.41       Past Psychiatric History: Please see initial evaluation for full details. I have reviewed the history. No updates at this time.     Past Medical History:  Past Medical History:  Diagnosis Date   Anxiety    Arthritis    Back pain    Depression, major, recurrent, moderate (HCC)    GERD (gastroesophageal reflux disease)    History of seizures    Lower extremity weakness    Prostate nodule    Benign   Sleep apnea    Suicidal ideation    Umbilical hernia    Urethral stricture in male     Past Surgical History:  Procedure Laterality Date   FRACTURE SURGERY     LT arm, LT  leg, Rt leg   PROSTATE BIOPSY     UMBILICAL HERNIA REPAIR N/A 12/03/2019   Procedure: HERNIA REPAIR UMBILICAL ADULT/ WITH MESH;  Surgeon: Franky Macho, MD;  Location: [facility];  Service: General;  Laterality: N/A;   URETHRAL STRICTURE DILATATION      Family Psychiatric History: Please see initial evaluation for full details. I have reviewed the history. No updates at this time.     Family History:  Family History  Problem Relation Age of Onset   Hypertension Mother    Diabetes Mother    Other Father        died at age 29 in accident - log fell on him   Sleep apnea Sister    Kidney cancer Maternal Aunt    Prostate cancer Paternal Uncle     Colon cancer Neg Hx    Esophageal cancer Neg Hx    Liver cancer Neg Hx    Pancreatic cancer Neg Hx     Social History:  Social History   Socioeconomic History   Dale Bender    Spouse name: Not on file   Number of children: 2   Years of education: 10th grade   Highest education level: Not on file  Occupational History   Occupation: Disability  Tobacco Use   Smoking status: Former    Current packs/day: 0.00    Average packs/day: 0.5 packs/day for 10.0 years (5.0 ttl pk-yrs)    Types: Cigarettes    Start date: 11/30/1992    Quit date: 12/01/2002    Years since quitting: 20.5   Smokeless tobacco: Never  Vaping Use   Vaping status: Never Used  Substance and Sexual Activity   Alcohol use: Never   Drug use: Never   Sexual activity: Not on file  Other Topics Concern   Not on file  Social History Narrative   Lives at home with his wife and children.   Right-handed.   2 cups caffeine per day.   Social Determinants of Health   Financial Resource Strain: Not on file  Food Insecurity: Not on file  Transportation Needs: Not on file  Physical Activity: Not on file  Stress: Not on file  Social Connections: Not on file    Allergies: No Known Allergies  Metabolic Disorder Labs: No results found for: "HGBA1C", "MPG" No results found for: "PROLACTIN" Lab Results  Component Value Date   TRIG 187 (H) 08/05/2019   Lab Results  Component Value Date   TSH 1.920 11/02/2020   TSH 2.430 06/10/2019    Therapeutic Level Labs: No results found for: "LITHIUM" No results found for: "VALPROATE" No results found for: "CBMZ"  Current Medications: Current Outpatient Medications  Medication Sig Dispense Refill   albuterol (VENTOLIN HFA) 108 (90 Base) MCG/ACT inhaler Inhale 2 puffs into the lungs every 6 (six) hours as needed for wheezing or shortness of breath. 8 g 0   buprenorphine (BUTRANS) 10 MCG/HR PTWK 1 patch once a week.     guaiFENesin-dextromethorphan  (ROBITUSSIN DM) 100-10 MG/5ML syrup Take 5-10 mLs by mouth every 4 (four) hours as needed for cough. 118 mL 0   LamoTRIgine 200 MG TB24 24 hour tablet Take 2 tablets (400 mg total) by mouth at bedtime. 180 tablet 3   omeprazole (PRILOSEC) 40 MG capsule Take 40 mg by mouth daily.     [START ON 07/31/2023] venlafaxine XR (EFFEXOR-XR) 75 MG 24 hr capsule Take 1 capsule (75 mg total) by mouth daily with breakfast. 30 capsule 1  ZTLIDO 1.8 % PTCH Apply 1 patch topically 3 (three) times daily.     No current facility-administered medications for this visit.     Musculoskeletal: Strength & Muscle Tone:  N/A Gait & Station:  N/A Patient leans: N/A  Psychiatric Specialty Exam: Review of Systems  Psychiatric/Behavioral:  Positive for sleep disturbance. Negative for agitation, behavioral problems, confusion, decreased concentration, dysphoric mood, hallucinations, self-injury and suicidal ideas. The patient is not nervous/anxious and is not hyperactive.   All other systems reviewed and are negative.   There were no vitals taken for this visit.There is no height or weight on file to calculate BMI.  General Appearance: Well Groomed  Eye Contact:  Good  Speech:  Clear and Coherent  Volume:  Normal  Mood:   alright  Affect:  Appropriate, Congruent, and calm  Thought Process:  Coherent  Orientation:  Full (Time, Place, and Person)  Thought Content: Logical   Suicidal Thoughts:  No  Homicidal Thoughts:  No  Memory:  Immediate;   Good  Judgement:  Good  Insight:  Good  Psychomotor Activity:  Normal  Concentration:  Concentration: Good and Attention Span: Good  Recall:  Good  Fund of Knowledge: Good  Language: Good  Akathisia:  No  Handed:  Right  AIMS (if indicated): not done  Assets:  Communication Skills Desire for Improvement  ADL's:  Intact  Cognition: WNL  Sleep:  Fair   Screenings: GAD-7    Advertising copywriter from 12/12/2022 in [facility]  [place] Counselor from 12/06/2021 in [facility] [place]  Total GAD-7 Score 18 18      PHQ2-9    Flowsheet Row Counselor from 12/12/2022 in [facility] [place] Office Visit from 01/01/2022 in [facility] Counselor from 12/06/2021 in [facility] [place] Video Visit from 08/30/2021 in [facility] Video Visit from 06/01/2021 in [facility]  PHQ-2 Total Score 2 0 4 4 2   PHQ-9 Total Score 11 2 20 10 5       Flowsheet Row Counselor from 12/12/2022 in [facility] [place] ED to Hosp-Admission (Discharged) from 04/03/2022 in [facility] [facility] Office Visit from 01/01/2022 in [facility]  C-SSRS RISK CATEGORY No Risk No Risk Low Risk        Assessment and Plan:  MADEX BATTERSON is a 52 y.o. year old male with a history of depression, complex partial seizure, obstructive sleep apnea on CPAP, who presents for follow up appointment for below.   1. MDD (major depressive disorder), recurrent, in partial remission (HCC) Acute stressors include: loss of his aunt, mother in law in hospice Other stressors include:  seizure disorder, Dale conflict, right leg weakness   History:  admitted to [facility] in March 2023 after SA of jumping from a moving car/overdosing of muscle relaxants     He denies any significant mood symptoms except that he is concerned about his mother-in-law, who was told to have 2 weeks.  Will continue current dose of venlafaxine to target depression.  Noted that he graduated from therapy.  He was advised to reach out to Mr. Montez Morita for follow up as needed, considering the current situation.    Plan Continue venlafaxine 75 mg daily  Next appointment- 2/12 at 8 am, video (he  declined in person due to  lack of transportation)- he was advised to contact the office if any concern/worsening in his symptoms  - on buprenorphine patch  - Reviewed TSH ; wnl   Past trials of medication: sertraline, venlafaxine (drowsiness), Abilify (irritability, nausea)   The patient demonstrates the following risk factors for suicide: Chronic risk factors for suicide include: psychiatric disorder of depression. Acute risk factors for suicide include: recent suicide attempt, loss (financial, interpersonal, professional). Protective factors for this patient include: positive social support, responsibility to others (children, family), coping skills and hope for the future. Considering these factors, the overall suicide risk at this point appears to be moderate, but not at imminent risk. Patient is appropriate for outpatient follow up. Emergency resources which includes 911, ED, suicide crisis line [phone]) are discussed.    Collaboration of Care: Collaboration of Care: Other reviewed notes in Epic  Patient/Guardian was advised Release of Information must be obtained prior to any record release in order to collaborate their care with an outside provider. Patient/Guardian was advised if they have not already done so to contact the registration department to sign all necessary forms in order for [name] to release information regarding their care.   Consent: Patient/Guardian gives verbal consent for treatment and assignment of benefits for services provided during this visit. Patient/Guardian expressed understanding and agreed to proceed.    Neysa Hotter, MD 07/02/2023, 8:20 AM

## 2023-07-02 ENCOUNTER — Telehealth (INDEPENDENT_AMBULATORY_CARE_PROVIDER_SITE_OTHER): Payer: Medicare HMO | Admitting: Psychiatry

## 2023-07-02 ENCOUNTER — Encounter: Payer: Self-pay | Admitting: Psychiatry

## 2023-07-02 DIAGNOSIS — R030 Elevated blood-pressure reading, without diagnosis of hypertension: Secondary | ICD-10-CM | POA: Diagnosis not present

## 2023-07-02 DIAGNOSIS — F3341 Major depressive disorder, recurrent, in partial remission: Secondary | ICD-10-CM

## 2023-07-02 DIAGNOSIS — Z6835 Body mass index (BMI) 35.0-35.9, adult: Secondary | ICD-10-CM | POA: Diagnosis not present

## 2023-07-02 DIAGNOSIS — Z0000 Encounter for general adult medical examination without abnormal findings: Secondary | ICD-10-CM | POA: Diagnosis not present

## 2023-07-02 DIAGNOSIS — K219 Gastro-esophageal reflux disease without esophagitis: Secondary | ICD-10-CM | POA: Diagnosis not present

## 2023-07-02 DIAGNOSIS — Z23 Encounter for immunization: Secondary | ICD-10-CM | POA: Diagnosis not present

## 2023-07-02 DIAGNOSIS — R5383 Other fatigue: Secondary | ICD-10-CM | POA: Diagnosis not present

## 2023-07-02 MED ORDER — VENLAFAXINE HCL ER 75 MG PO CP24: 75.0000 mg | ORAL_CAPSULE | Freq: Every day | ORAL | 1 refills | Status: DC

## 2023-07-16 DIAGNOSIS — Z23 Encounter for immunization: Secondary | ICD-10-CM | POA: Diagnosis not present

## 2023-07-21 DIAGNOSIS — Z79891 Long term (current) use of opiate analgesic: Secondary | ICD-10-CM | POA: Diagnosis not present

## 2023-07-21 DIAGNOSIS — M5116 Intervertebral disc disorders with radiculopathy, lumbar region: Secondary | ICD-10-CM | POA: Diagnosis not present

## 2023-07-21 DIAGNOSIS — G894 Chronic pain syndrome: Secondary | ICD-10-CM | POA: Diagnosis not present

## 2023-07-21 DIAGNOSIS — M549 Dorsalgia, unspecified: Secondary | ICD-10-CM | POA: Diagnosis not present

## 2023-07-22 DIAGNOSIS — Z6835 Body mass index (BMI) 35.0-35.9, adult: Secondary | ICD-10-CM | POA: Diagnosis not present

## 2023-07-22 DIAGNOSIS — M7711 Lateral epicondylitis, right elbow: Secondary | ICD-10-CM | POA: Diagnosis not present

## 2023-07-22 DIAGNOSIS — G894 Chronic pain syndrome: Secondary | ICD-10-CM | POA: Diagnosis not present

## 2023-07-22 DIAGNOSIS — M791 Myalgia, unspecified site: Secondary | ICD-10-CM | POA: Diagnosis not present

## 2023-07-25 ENCOUNTER — Ambulatory Visit (INDEPENDENT_AMBULATORY_CARE_PROVIDER_SITE_OTHER): Payer: Medicare HMO | Admitting: Podiatry

## 2023-07-25 ENCOUNTER — Encounter: Payer: Self-pay | Admitting: Podiatry

## 2023-07-25 DIAGNOSIS — M79675 Pain in left toe(s): Secondary | ICD-10-CM | POA: Diagnosis not present

## 2023-07-25 DIAGNOSIS — M79674 Pain in right toe(s): Secondary | ICD-10-CM

## 2023-07-25 DIAGNOSIS — B351 Tinea unguium: Secondary | ICD-10-CM | POA: Diagnosis not present

## 2023-07-27 NOTE — Progress Notes (Signed)
  Subjective:  Patient ID: Dale Bender, male    DOB: May 25, 1971,  MRN: [id number]  Chief Complaint  Patient presents with   Nail Problem    Rfc.    52 y.o. male presents with the above complaint. History confirmed with patient. Patient presenting with pain related to dystrophic thickened elongated nails. Patient is unable to trim own nails related to nail dystrophy and/or mobility issues. Patient does not have a history of T2DM.  Patient does have history of mobility issues, chronic pain.  Objective:  Physical Exam: warm, good capillary refill nail exam onychomycosis of the toenails, dystrophic nails, notable for increased thickness, increased length, yellow discoloration with subungual debris, tenderness on direct dorsal palpation of the nails. DP pulses palpable, PT pulses palpable, and protective sensation intact Left Foot:  Pain with palpation of nails due to elongation and dystrophic growth.  Right Foot: Pain with palpation of nails due to elongation and dystrophic growth.   Assessment:   1. Pain due to onychomycosis of toenails of both feet      Plan:  Patient was evaluated and treated and all questions answered.   #Onychomycosis with pain  -Nails palliatively debrided as below. -Educated on self-care  Procedure: Nail Debridement Rationale: Pain Type of Debridement: manual, sharp debridement. Instrumentation: Nail nipper, rotary burr. Number of Nails: 10  Return in about 3 months (around 10/23/2023) for Routine Foot Care.         Bronwen Betters, DPM [facility] / [facility]

## 2023-09-20 NOTE — Progress Notes (Signed)
Virtual Visit via Video Note  I connected with Dale Bender on 09/24/23 at  8:00 AM EST by a video enabled telemedicine application and verified that I am speaking with the correct person using two identifiers.  Location: Patient: home Provider: office Persons participated in the visit- patient, provider    I discussed the limitations of evaluation and management by telemedicine and the availability of in person appointments. The patient expressed understanding and agreed to proceed.    I discussed the assessment and treatment plan with the patient. The patient was provided an opportunity to ask questions and all were answered. The patient agreed with the plan and demonstrated an understanding of the instructions.   The patient was advised to call back or seek an in-person evaluation if the symptoms worsen or if the condition fails to improve as anticipated.    Neysa Hotter, MD    [facility] MD/PA/NP OP Progress Note  09/24/2023 8:18 AM Dale Bender  MRN:  [id number]  Chief Complaint:  Chief Complaint  Patient presents with   Follow-up   HPI:  This is a follow-up appointment for depression.  He states that he has been doing well.  He had good holiday with his family.  He continues to work in [employer].  He is concerned about hypersomnia.  Although he sleeps longer than 7 hours, he dozes off during the day.  He uses CPAP machine regularly.  He may take a walk at times, although he continues to struggle with back pain.  He reports occasional down mood, when mind is thinking about something bad.  He does not elaborate on this.  However, he denies any SI, and he thinks he is doing good overall.  Although he knows that he can be seen by Mr. Montez Morita again, he is not interested in this for now.  He feels comfortable to stay on the same dose of the medication.   Daily routine: stays in the house most of the time, goes to church on Wed, Sundays Employment: on long term disability from his  job, (currently waiting for decision from the state). He used to works at [employer] for 13 years Support: Dale Bender Household: wife, his children Marital status: married  Number of children: 28 (45, 33 year old son/daughter)  Visit Diagnosis:    ICD-10-CM   1. MDD (major depressive disorder), recurrent, in partial remission (HCC)  F33.41     2. Hypersomnia with sleep apnea  G47.10    G47.30       Past Psychiatric History: Please see initial evaluation for full details. I have reviewed the history. No updates at this time.     Past Medical History:  Past Medical History:  Diagnosis Date   Anxiety    Arthritis    Back pain    Depression, major, recurrent, moderate (HCC)    GERD (gastroesophageal reflux disease)    History of seizures    Lower extremity weakness    Prostate nodule    Benign   Sleep apnea    Suicidal ideation    Umbilical hernia    Urethral stricture in male     Past Surgical History:  Procedure Laterality Date   FRACTURE SURGERY     LT arm, LT leg, Rt leg   PROSTATE BIOPSY     UMBILICAL HERNIA REPAIR N/A 12/03/2019   Procedure: HERNIA REPAIR UMBILICAL ADULT/ WITH MESH;  Surgeon: Franky Macho, MD;  Location: [facility];  Service: General;  Laterality: N/A;  URETHRAL STRICTURE DILATATION      Family Psychiatric History: Please see initial evaluation for full details. I have reviewed the history. No updates at this time.     Family History:  Family History  Problem Relation Age of Onset   Hypertension Mother    Diabetes Mother    Other Father        died at age 77 in accident - log fell on him   Sleep apnea Sister    Kidney cancer Maternal Aunt    Prostate cancer Paternal Uncle    Colon cancer Neg Hx    Esophageal cancer Neg Hx    Liver cancer Neg Hx    Pancreatic cancer Neg Hx     Social History:  Social History   Socioeconomic History   Marital status: Married    Spouse name: Not on file   Number of children: 2   Years of education: 10th  grade   Highest education level: Not on file  Occupational History   Occupation: Disability  Tobacco Use   Smoking status: Former    Current packs/day: 0.00    Average packs/day: 0.5 packs/day for 10.0 years (5.0 ttl pk-yrs)    Types: Cigarettes    Start date: 11/30/1992    Quit date: 12/01/2002    Years since quitting: 20.8   Smokeless tobacco: Never  Vaping Use   Vaping status: Never Used  Substance and Sexual Activity   Alcohol use: Never   Drug use: Never   Sexual activity: Not on file  Other Topics Concern   Not on file  Social History Narrative   Lives at home with his wife and children.   Right-handed.   2 cups caffeine per day.   Social Drivers of Corporate investment banker Strain: Not on file  Food Insecurity: Not on file  Transportation Needs: Not on file  Physical Activity: Not on file  Stress: Not on file  Social Connections: Not on file    Allergies: No Known Allergies  Metabolic Disorder Labs: No results found for: "HGBA1C", "MPG" No results found for: "PROLACTIN" Lab Results  Component Value Date   TRIG 187 (H) 08/05/2019   Lab Results  Component Value Date   TSH 1.920 11/02/2020   TSH 2.430 06/10/2019    Therapeutic Level Labs: No results found for: "LITHIUM" No results found for: "VALPROATE" No results found for: "CBMZ"  Current Medications: Current Outpatient Medications  Medication Sig Dispense Refill   albuterol (VENTOLIN HFA) 108 (90 Base) MCG/ACT inhaler Inhale 2 puffs into the lungs every 6 (six) hours as needed for wheezing or shortness of breath. 8 g 0   buprenorphine (BUTRANS) 10 MCG/HR PTWK 1 patch once a week.     guaiFENesin-dextromethorphan (ROBITUSSIN DM) 100-10 MG/5ML syrup Take 5-10 mLs by mouth every 4 (four) hours as needed for cough. 118 mL 0   LamoTRIgine 200 MG TB24 24 hour tablet Take 2 tablets (400 mg total) by mouth at bedtime. 180 tablet 3   omeprazole (PRILOSEC) 40 MG capsule Take 40 mg by mouth daily.      [START ON 09/29/2023] venlafaxine XR (EFFEXOR-XR) 75 MG 24 hr capsule Take 1 capsule (75 mg total) by mouth daily with breakfast. 30 capsule 2   ZTLIDO 1.8 % PTCH Apply 1 patch topically 3 (three) times daily.     No current facility-administered medications for this visit.     Musculoskeletal: Strength & Muscle Tone:  N/A Gait & Station:  N/A Patient  leans: N/A  Psychiatric Specialty Exam: Review of Systems  Psychiatric/Behavioral:  Positive for dysphoric mood and sleep disturbance. Negative for agitation, behavioral problems, confusion, decreased concentration, hallucinations, self-injury and suicidal ideas. The patient is not nervous/anxious and is not hyperactive.   All other systems reviewed and are negative.   There were no vitals taken for this visit.There is no height or weight on file to calculate BMI.  General Appearance: Well Groomed  Eye Contact:  Good  Speech:  Clear and Coherent  Volume:  Normal  Mood:   good  Affect:  Appropriate, Congruent, and Full Range  Thought Process:  Coherent  Orientation:  Full (Time, Place, and Person)  Thought Content: Logical   Suicidal Thoughts:  No  Homicidal Thoughts:  No  Memory:  Immediate;   Good  Judgement:  Good  Insight:  Good  Psychomotor Activity:  Normal  Concentration:  Concentration: Good and Attention Span: Good  Recall:  Good  Fund of Knowledge: Good  Language: Good  Akathisia:  No  Handed:  Right  AIMS (if indicated): not done  Assets:  Communication Skills Desire for Improvement  ADL's:  Intact  Cognition: WNL  Sleep:   hypersomnia   Screenings: GAD-7    Advertising copywriter from 12/12/2022 in [facility] [place] Counselor from 12/06/2021 in [facility] [place]  Total GAD-7 Score 18 18      PHQ2-9    Flowsheet Row Counselor from 12/12/2022 in [facility] [place] Office Visit from 01[facility]01/2022 in [facility] Counselor from 12[facility]06/2021 in [facility] [place] [facility] Video Visit from 08/30/2021 in [facility] Video Visit from 06/01/2021 in [facility]  PHQ-2 Total Score 2 0 4 4 2   PHQ-9 Total Score 11 2 20 10 5       Flowsheet Row Counselor from 12/12/2022 in [facility] to Hosp-Admission (Discharged) from 04/03/2022 in [facility] Office Visit from 01/01/2022 in [facility]  C-SSRS RISK CATEGORY No Risk No Risk Low Risk        Assessment and Plan:  ANGELL HONSE is a 53 y.o. year old male with a history of depression, complex partial seizure, obstructive sleep apnea on CPAP, who presents for follow up appointment for below.   1. MDD (major depressive disorder), recurrent, in partial remission (HCC) Acute stressors include: loss of his aunt, mother in law in hospice Other stressors include:  seizure disorder, marital conflict, right leg weakness, back pain   History:  admitted to [facility] in March 2023 after SA of jumping from a moving car/overdosing of muscle relaxants     Although he reports occasional down mood, he denies significant concern.  He is actively engaged in church activities.  Will continue current dose of venlafaxine to target depression.  Although he will benefit from therapy, he graduated, and he does not see the need to be seen again, although he is aware that he has resource.    2. Hypersomnia with sleep apnea He has hypersomnia.  He is adherent to CPAP machine.  Although he was advised to reach out to his sleep provider, he reports that nothing has been done despite his complaint in the past.  He is on Butrans patch, which could contribute to hypersomnia; he was advised to discuss his  concern with his pain provider to see  if any adjustment could be considered.      Plan Continue venlafaxine 75 mg daily  Next appointment- 5/2 at 8 am, video (he declined in person due to lack of transportation)- he was advised to contact the office if any concern/worsening in his symptoms  - on buprenorphine patch  - Reviewed TSH ; wnl   Past trials of medication: sertraline, venlafaxine (drowsiness), Abilify (irritability, nausea)   The patient demonstrates the following risk factors for suicide: Chronic risk factors for suicide include: psychiatric disorder of depression. Acute risk factors for suicide include: recent suicide attempt, loss (financial, interpersonal, professional). Protective factors for this patient include: positive social support, responsibility to others (children, family), coping skills and hope for the future. Considering these factors, the overall suicide risk at this point appears to be moderate, but not at imminent risk. Patient is appropriate for outpatient follow up. Emergency resources which includes 911, ED, suicide crisis line [phone]) are discussed.    Collaboration of Care: Collaboration of Care: Other reviewed notes in Epic  Patient/Guardian was advised Release of Information must be obtained prior to any record release in order to collaborate their care with an outside provider. Patient/Guardian was advised if they have not already done so to contact the registration department to sign all necessary forms in order for [name] to release information regarding their care.   Consent: Patient/Guardian gives verbal consent for treatment and assignment of benefits for services provided during this visit. Patient/Guardian expressed understanding and agreed to proceed.    Neysa Hotter, MD 09/24/2023, 8:18 AM

## 2023-09-24 ENCOUNTER — Telehealth (INDEPENDENT_AMBULATORY_CARE_PROVIDER_SITE_OTHER): Payer: Medicare HMO | Admitting: Psychiatry

## 2023-09-24 ENCOUNTER — Encounter: Payer: Self-pay | Admitting: Psychiatry

## 2023-09-24 DIAGNOSIS — F3341 Major depressive disorder, recurrent, in partial remission: Secondary | ICD-10-CM | POA: Diagnosis not present

## 2023-09-24 DIAGNOSIS — G4730 Sleep apnea, unspecified: Secondary | ICD-10-CM | POA: Diagnosis not present

## 2023-09-24 DIAGNOSIS — G4710 Hypersomnia, unspecified: Secondary | ICD-10-CM

## 2023-09-24 MED ORDER — VENLAFAXINE HCL ER 75 MG PO CP24: 75.0000 mg | ORAL_CAPSULE | Freq: Every day | ORAL | 2 refills | Status: DC

## 2023-09-24 NOTE — Patient Instructions (Signed)
Continue venlafaxine 75 mg daily  Next appointment- 5/2 at 8 am

## 2023-09-30 DIAGNOSIS — M549 Dorsalgia, unspecified: Secondary | ICD-10-CM | POA: Diagnosis not present

## 2023-09-30 DIAGNOSIS — G894 Chronic pain syndrome: Secondary | ICD-10-CM | POA: Diagnosis not present

## 2023-09-30 DIAGNOSIS — Z79891 Long term (current) use of opiate analgesic: Secondary | ICD-10-CM | POA: Diagnosis not present

## 2023-09-30 DIAGNOSIS — M5116 Intervertebral disc disorders with radiculopathy, lumbar region: Secondary | ICD-10-CM | POA: Diagnosis not present

## 2023-10-24 ENCOUNTER — Encounter: Payer: Self-pay | Admitting: Podiatry

## 2023-10-24 ENCOUNTER — Ambulatory Visit (INDEPENDENT_AMBULATORY_CARE_PROVIDER_SITE_OTHER): Payer: Medicare HMO | Admitting: Podiatry

## 2023-10-24 DIAGNOSIS — B353 Tinea pedis: Secondary | ICD-10-CM

## 2023-10-24 DIAGNOSIS — B351 Tinea unguium: Secondary | ICD-10-CM | POA: Diagnosis not present

## 2023-10-24 DIAGNOSIS — M47816 Spondylosis without myelopathy or radiculopathy, lumbar region: Secondary | ICD-10-CM | POA: Insufficient documentation

## 2023-10-24 DIAGNOSIS — M79675 Pain in left toe(s): Secondary | ICD-10-CM

## 2023-10-24 DIAGNOSIS — M79674 Pain in right toe(s): Secondary | ICD-10-CM | POA: Diagnosis not present

## 2023-10-24 MED ORDER — KETOCONAZOLE 2 % EX CREA
1.0000 | TOPICAL_CREAM | Freq: Every day | CUTANEOUS | 0 refills | Status: DC
Start: 2023-10-24 — End: 2023-11-18

## 2023-10-24 NOTE — Progress Notes (Signed)
  Subjective:  Patient ID: Dale Bender, male    DOB: Sep 22, 1970,  MRN: [id number]  Chief Complaint  Patient presents with   Debridement    Trim toenails - not diabetic     53 y.o. male presents with the above complaint. History confirmed with patient. Patient presenting with pain related to dystrophic thickened elongated nails. Patient is unable to trim own nails related to nail dystrophy and/or mobility issues. Patient does not have a history of T2DM.  Patient does have history of mobility issues, chronic pain.  Objective:  Physical Exam: warm, good capillary refill nail exam onychomycosis of the toenails, dystrophic nails, notable for increased thickness, increased length, yellow discoloration with subungual debris, tenderness on direct dorsal palpation of the nails. DP pulses palpable, PT pulses palpable, and protective sensation intact Left Foot:  Pain with palpation of nails due to elongation and dystrophic growth.  Right Foot: Pain with palpation of nails due to elongation and dystrophic growth.  Pedal skin is dry, flaky, xerotic with maceration present to third interspace bilaterally right greater than left. Assessment:   1. Tinea pedis of both feet   2. Pain due to onychomycosis of toenails of both feet      Plan:  Patient was evaluated and treated and all questions answered.  # Tinea pedis -Topical ketoconazole cream 2% sent to patient's pharmacy to be applied once a day for 3 weeks. -Discussed pedal hygiene and home care instructions for athlete's foot  #Onychomycosis with pain  -Nails palliatively debrided as below. -Educated on self-care  Procedure: Nail Debridement Rationale: Pain Type of Debridement: manual, sharp debridement. Instrumentation: Nail nipper, rotary burr. Number of Nails: 10  Return in about 3 months (around 01/24/2024) for Routine Foot Care.         Bronwen Betters, DPM [facility]

## 2023-11-13 ENCOUNTER — Other Ambulatory Visit: Payer: Self-pay | Admitting: Podiatry

## 2023-11-13 DIAGNOSIS — B353 Tinea pedis: Secondary | ICD-10-CM

## 2023-12-06 NOTE — Progress Notes (Signed)
 Virtual Visit via Video Note  I connected with Dale Bender on 12/12/23 at  8:00 AM EDT by a video enabled telemedicine application and verified that I am speaking with the correct person using two identifiers.  Location: Patient: home Provider: home office Persons participated in the visit- patient, provider    I discussed the limitations of evaluation and management by telemedicine and the availability of in person appointments. The patient expressed understanding and agreed to proceed.  I discussed the assessment and treatment plan with the patient. The patient was provided an opportunity to ask questions and all were answered. The patient agreed with the plan and demonstrated an understanding of the instructions.   The patient was advised to call back or seek an in-person evaluation if the symptoms worsen or if the condition fails to improve as anticipated.    Todd Fossa, MD    [facility] MD/PA/NP OP Progress Note  12/12/2023 8:22 AM Dale Bender  MRN:  [id number]  Chief Complaint:  Chief Complaint  Patient presents with   Follow-up   HPI:  This is a follow-up appointment for depression and fatigue.  He states that he has been doing well.  Everything is going good.  There was a revival.  Although he could not attend there, he goes to the church few times a week, including for Bible study.  He feels peace.  Although the community there is good.  He feels down, although he does not feel depressed.  He would like to get a job, although it has been difficult.  He may be helping his brother, but he is unsure.  He agrees to make a conscious effort to contribute to his family in ways he can.  He sleeps well with CPAP machine.  He feels fatigued during the day, and takes some rest.  He thinks all of his medications making him feel drowsy.  He denies change in appetite.  He denies SI.  He agrees with the plan as outlined below.   Daily routine: stays in the house most of the time, goes  to church on Wed, Sundays Employment: on long term disability from his job, (currently waiting for decision from the state). He used to works at [employer] for 13 years Support: [employer] Household: wife, his children Marital status: married  Number of children: 61 (58, 94 year old son/daughter)  Visit Diagnosis:    ICD-10-CM   1. MDD (major depressive disorder), recurrent, in partial remission (HCC)  F33.41 TSH    VITAMIN D 25 Hydroxy (Vit-D Deficiency, Fractures)    2. Fatigue, unspecified type  R53.83 VITAMIN D 25 Hydroxy (Vit-D Deficiency, Fractures)    3. Encounter for vitamin deficiency screening  Z13.21 VITAMIN D 25 Hydroxy (Vit-D Deficiency, Fractures)      Past Psychiatric History: Please see initial evaluation for full details. I have reviewed the history. No updates at this time.     Past Medical History:  Past Medical History:  Diagnosis Date   Anxiety    Arthritis    Back pain    Depression, major, recurrent, moderate (HCC)    GERD (gastroesophageal reflux disease)    History of seizures    Lower extremity weakness    Prostate nodule    Benign   Sleep apnea    Suicidal ideation    Umbilical hernia    Urethral stricture in male     Past Surgical History:  Procedure Laterality Date   FRACTURE SURGERY     LT  arm, LT leg, Rt leg   PROSTATE BIOPSY     UMBILICAL HERNIA REPAIR N/A 12/03/2019   Procedure: HERNIA REPAIR UMBILICAL ADULT/ WITH MESH;  Surgeon: Alanda Allegra, MD;  Location: [facility];  Service: General;  Laterality: N/A;   URETHRAL STRICTURE DILATATION      Family Psychiatric History: Please see initial evaluation for full details. I have reviewed the history. No updates at this time.     Family History:  Family History  Problem Relation Age of Onset   Hypertension Mother    Diabetes Mother    Other Father        died at age 33 in accident - log fell on him   Sleep apnea Sister    Kidney cancer Maternal Aunt    Prostate cancer Paternal Uncle    Colon  cancer Neg Hx    Esophageal cancer Neg Hx    Liver cancer Neg Hx    Pancreatic cancer Neg Hx     Social History:  Social History   Socioeconomic History   Marital status: Married    Spouse name: Not on file   Number of children: 2   Years of education: 10th grade   Highest education level: Not on file  Occupational History   Occupation: Disability  Tobacco Use   Smoking status: Former    Current packs/day: 0.00    Average packs/day: 0.5 packs/day for 10.0 years (5.0 ttl pk-yrs)    Types: Cigarettes    Start date: 11/30/1992    Quit date: 12/01/2002    Years since quitting: 21.0   Smokeless tobacco: Never  Vaping Use   Vaping status: Never Used  Substance and Sexual Activity   Alcohol  use: Never   Drug use: Never   Sexual activity: Not on file  Other Topics Concern   Not on file  Social History Narrative   Lives at home with his wife and children.   Right-handed.   2 cups caffeine per day.   Social Drivers of Corporate investment banker Strain: Not on file  Food Insecurity: Not on file  Transportation Needs: Not on file  Physical Activity: Not on file  Stress: Not on file  Social Connections: Not on file    Allergies: No Known Allergies  Metabolic Disorder Labs: No results found for: "HGBA1C", "MPG" No results found for: "PROLACTIN" Lab Results  Component Value Date   TRIG 187 (H) 08/05/2019   Lab Results  Component Value Date   TSH 1.920 11/02/2020   TSH 2.430 06/10/2019    Therapeutic Level Labs: No results found for: "LITHIUM" No results found for: "VALPROATE" No results found for: "CBMZ"  Current Medications: Current Outpatient Medications  Medication Sig Dispense Refill   albuterol  (VENTOLIN  HFA) 108 (90 Base) MCG/ACT inhaler Inhale 2 puffs into the lungs every 6 (six) hours as needed for wheezing or shortness of breath. 8 g 0   buprenorphine (BUTRANS) 10 MCG/HR PTWK 1 patch once a week.     ketoconazole  (NIZORAL ) 2 % cream apply TOPICALLY  DAILY FOR 21 DAYS 21 g 0   LamoTRIgine  200 MG TB24 24 hour tablet Take 2 tablets (400 mg total) by mouth at bedtime. 180 tablet 3   omeprazole (PRILOSEC) 40 MG capsule Take 40 mg by mouth daily.     pantoprazole  (PROTONIX ) 40 MG tablet Take 40 mg by mouth daily.     [START ON 12/28/2023] venlafaxine  XR (EFFEXOR -XR) 75 MG 24 hr capsule Take 1 capsule (75 mg  total) by mouth daily with breakfast. 30 capsule 1   No current facility-administered medications for this visit.     Musculoskeletal: Strength & Muscle Tone:  N/A Gait & Station:  N/A Patient leans: N/A  Psychiatric Specialty Exam: Review of Systems  Psychiatric/Behavioral:  Positive for sleep disturbance. Negative for agitation, behavioral problems, confusion, decreased concentration, dysphoric mood, hallucinations, self-injury and suicidal ideas. The patient is not nervous/anxious and is not hyperactive.   All other systems reviewed and are negative.   There were no vitals taken for this visit.There is no height or weight on file to calculate BMI.  General Appearance: Well Groomed  Eye Contact:  Good  Speech:  Clear and Coherent  Volume:  Normal  Mood:   good  Affect:  Appropriate, Congruent, and calm  Thought Process:  Coherent  Orientation:  Full (Time, Place, and Person)  Thought Content: Logical   Suicidal Thoughts:  No  Homicidal Thoughts:  No  Memory:  Immediate;   Good  Judgement:  Good  Insight:  Good  Psychomotor Activity:  Normal  Concentration:  Concentration: Good and Attention Span: Good  Recall:  Good  Fund of Knowledge: Good  Language: Good  Akathisia:  No  Handed:  Right  AIMS (if indicated): not done  Assets:  Communication Skills Desire for Improvement  ADL's:  Intact  Cognition: WNL  Sleep:  Fair   Screenings: GAD-7    Advertising copywriter from 12/12/2022 in [facility] [place] Counselor from 12/06/2021 in [facility]  [place]  Total GAD-7 Score 18 18      PHQ2-9    Flowsheet Row Counselor from 12/12/2022 in [facility] [place] Office Visit from 01/01/2022 in [facility] Counselor from 12/06/2021 in [facility] [place] Video Visit from 08/30/2021 in [facility] Video Visit from 06/01/2021 in [facility]  PHQ-2 Total Score 2 0 4 4 2   PHQ-9 Total Score 11 2 20 10 5       Flowsheet Row Counselor from 12/12/2022 in [facility] [place] ED to Hosp-Admission (Discharged) from 04/03/2022 in [place] [facility] Office Visit from 01/01/2022 in [facility]  C-SSRS RISK CATEGORY No Risk No Risk Low Risk        Assessment and Plan:  Dale Bender is a 53 y.o. year old male with a history of depression, complex partial seizure, obstructive sleep apnea on CPAP, who presents for follow up appointment for below.   1. MDD (major depressive disorder), recurrent, in partial remission (HCC) Acute stressors include: loss of his aunt, mother in law in hospice Other stressors include:  seizure disorder, marital conflict, right leg weakness, back pain   History:  admitted to [facility] in March 2023 after SA of jumping from a moving car/overdosing of muscle relaxants    He continues to report occasional down mood in the context of unemployment.  He is actively searching for a job, and engaged in church activities.  Will continue current dose of venlafaxine  to target depression.  Noted that although he reports some drowsiness from the medication, he feels comfortable to stay on this for now.  Although he will greatly benefit from CBT, he is not interested in restarting again after graduation.  Will continue to discuss as needed.   2. Fatigue, unspecified  type 3. Encounter for vitamin deficiency screening He continues to feel fatigue and slight hypersomnia during the day, which she mainly attributes to medication, including Butrans patch.  Will obtain labs to rule out medical health issues contributing to his symptoms.     Plan Continue venlafaxine  75 mg daily  Obtain labs- Vitamin D , TSH Next appointment- 7/11 at 8 am, video (he declined in person due to lack of transportation)- he was advised to contact the office if any concern/worsening in his symptoms  - on buprenorphine patch  - Reviewed TSH ; wnl   Past trials of medication: sertraline , venlafaxine  (drowsiness), Abilify  (irritability, nausea)   The patient demonstrates the following risk factors for suicide: Chronic risk factors for suicide include: psychiatric disorder of depression. Acute risk factors for suicide include: recent suicide attempt, loss (financial, interpersonal, professional). Protective factors for this patient include: positive social support, responsibility to others (children, family), coping skills and hope for the future. Considering these factors, the overall suicide risk at this point appears to be moderate, but not at imminent risk. Patient is appropriate for outpatient follow up. Emergency resources which includes 911, ED, suicide crisis line [phone]) are discussed.    Collaboration of Care: Collaboration of Care: Other reviewed notes in Epic  Patient/Guardian was advised Release of Information must be obtained prior to any record release in order to collaborate their care with an outside provider. Patient/Guardian was advised if they have not already done so to contact the registration department to sign all necessary forms in order for us  to release information regarding their care.   Consent: Patient/Guardian gives verbal consent for treatment and assignment of benefits for services provided during this visit. Patient/Guardian expressed understanding  and agreed to proceed.    Todd Fossa, MD 12/12/2023, 8:22 AM

## 2023-12-09 DIAGNOSIS — H5213 Myopia, bilateral: Secondary | ICD-10-CM | POA: Diagnosis not present

## 2023-12-09 DIAGNOSIS — H35463 Secondary vitreoretinal degeneration, bilateral: Secondary | ICD-10-CM | POA: Diagnosis not present

## 2023-12-12 ENCOUNTER — Telehealth (INDEPENDENT_AMBULATORY_CARE_PROVIDER_SITE_OTHER): Payer: Medicare HMO | Admitting: Psychiatry

## 2023-12-12 ENCOUNTER — Encounter: Payer: Self-pay | Admitting: Psychiatry

## 2023-12-12 DIAGNOSIS — F3341 Major depressive disorder, recurrent, in partial remission: Secondary | ICD-10-CM

## 2023-12-12 DIAGNOSIS — Z1321 Encounter for screening for nutritional disorder: Secondary | ICD-10-CM | POA: Diagnosis not present

## 2023-12-12 DIAGNOSIS — R5383 Other fatigue: Secondary | ICD-10-CM

## 2023-12-12 MED ORDER — VENLAFAXINE HCL ER 75 MG PO CP24: 75.0000 mg | ORAL_CAPSULE | Freq: Every day | ORAL | 1 refills | Status: DC

## 2023-12-12 NOTE — Patient Instructions (Signed)
 Continue venlafaxine  75 mg daily  Obtain labs- Vitamin D , TSH Next appointment- 7/11 at 8 am

## 2023-12-17 DIAGNOSIS — Z0100 Encounter for examination of eyes and vision without abnormal findings: Secondary | ICD-10-CM | POA: Diagnosis not present

## 2023-12-23 DIAGNOSIS — M5116 Intervertebral disc disorders with radiculopathy, lumbar region: Secondary | ICD-10-CM | POA: Diagnosis not present

## 2023-12-23 DIAGNOSIS — Z79891 Long term (current) use of opiate analgesic: Secondary | ICD-10-CM | POA: Diagnosis not present

## 2023-12-23 DIAGNOSIS — G894 Chronic pain syndrome: Secondary | ICD-10-CM | POA: Diagnosis not present

## 2023-12-23 DIAGNOSIS — M549 Dorsalgia, unspecified: Secondary | ICD-10-CM | POA: Diagnosis not present

## 2023-12-23 DIAGNOSIS — Z79899 Other long term (current) drug therapy: Secondary | ICD-10-CM | POA: Diagnosis not present

## 2023-12-24 DIAGNOSIS — Z125 Encounter for screening for malignant neoplasm of prostate: Secondary | ICD-10-CM | POA: Diagnosis not present

## 2023-12-24 DIAGNOSIS — N1831 Chronic kidney disease, stage 3a: Secondary | ICD-10-CM | POA: Diagnosis not present

## 2023-12-24 DIAGNOSIS — G40909 Epilepsy, unspecified, not intractable, without status epilepticus: Secondary | ICD-10-CM | POA: Diagnosis not present

## 2023-12-31 DIAGNOSIS — Z6834 Body mass index (BMI) 34.0-34.9, adult: Secondary | ICD-10-CM | POA: Diagnosis not present

## 2023-12-31 DIAGNOSIS — K219 Gastro-esophageal reflux disease without esophagitis: Secondary | ICD-10-CM | POA: Diagnosis not present

## 2023-12-31 DIAGNOSIS — M791 Myalgia, unspecified site: Secondary | ICD-10-CM | POA: Diagnosis not present

## 2023-12-31 DIAGNOSIS — M545 Low back pain, unspecified: Secondary | ICD-10-CM | POA: Diagnosis not present

## 2024-01-30 ENCOUNTER — Ambulatory Visit (INDEPENDENT_AMBULATORY_CARE_PROVIDER_SITE_OTHER): Admitting: Podiatry

## 2024-01-30 ENCOUNTER — Encounter: Payer: Self-pay | Admitting: Podiatry

## 2024-01-30 DIAGNOSIS — B353 Tinea pedis: Secondary | ICD-10-CM

## 2024-01-30 DIAGNOSIS — B351 Tinea unguium: Secondary | ICD-10-CM | POA: Diagnosis not present

## 2024-01-30 DIAGNOSIS — M79674 Pain in right toe(s): Secondary | ICD-10-CM

## 2024-01-30 DIAGNOSIS — M79675 Pain in left toe(s): Secondary | ICD-10-CM

## 2024-01-30 MED ORDER — KETOCONAZOLE 2 % EX CREA: TOPICAL_CREAM | Freq: Every day | CUTANEOUS | 2 refills | Status: DC

## 2024-01-30 NOTE — Progress Notes (Signed)
 This patient presents to the office with chief complaint of long thick painful nails.  Patient says the nails are painful walking and wearing shoes.  This patient is unable to self treat.  This patient is unable to trim his  nails since he is unable to reach his  nails.  he presents to the office for preventative foot care services. He also requests refill on his ketoconazole .  General Appearance  Alert, conversant and in no acute stress.  Vascular  Dorsalis pedis and posterior tibial  pulses are palpable  bilaterally.  Capillary return is within normal limits  bilaterally. Temperature is within normal limits  bilaterally.  Neurologic  Senn-Weinstein monofilament wire test within normal limits  bilaterally. Muscle power within normal limits bilaterally.  Nails Thick disfigured discolored nails with subungual debris  from hallux to fifth toes bilaterally. No evidence of bacterial infection or drainage bilaterally.  Orthopedic  No limitations of motion  feet .  No crepitus or effusions noted.  No bony pathology or digital deformities noted.  Skin  normotropic skin with no porokeratosis noted bilaterally.  No signs of infections or ulcers noted.     Onychomycosis  Nails  B/L.  Pain in right toes  Pain in left toes  Tinea pedis  B/L.  Debridement of nails both feet followed trimming the nails with dremel tool.  Patient says he has faithfully applied the ketoconazole  and skin has improved.   RTC 3 months.   Ruffin Cotton DPM

## 2024-02-03 DIAGNOSIS — H40013 Open angle with borderline findings, low risk, bilateral: Secondary | ICD-10-CM | POA: Diagnosis not present

## 2024-02-13 ENCOUNTER — Telehealth: Payer: Self-pay | Admitting: Neurology

## 2024-02-13 NOTE — Telephone Encounter (Signed)
 Reviewed optometrist evaluation by Dr. 

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
## 2024-02-17 DIAGNOSIS — M5116 Intervertebral disc disorders with radiculopathy, lumbar region: Secondary | ICD-10-CM | POA: Diagnosis not present

## 2024-02-17 DIAGNOSIS — M549 Dorsalgia, unspecified: Secondary | ICD-10-CM | POA: Diagnosis not present

## 2024-02-17 DIAGNOSIS — Z79899 Other long term (current) drug therapy: Secondary | ICD-10-CM | POA: Diagnosis not present

## 2024-02-17 DIAGNOSIS — G894 Chronic pain syndrome: Secondary | ICD-10-CM | POA: Diagnosis not present

## 2024-02-17 DIAGNOSIS — Z79891 Long term (current) use of opiate analgesic: Secondary | ICD-10-CM | POA: Diagnosis not present

## 2024-02-20 ENCOUNTER — Telehealth: Admitting: Psychiatry

## 2024-02-20 ENCOUNTER — Encounter: Payer: Self-pay | Admitting: Psychiatry

## 2024-02-20 DIAGNOSIS — Z1321 Encounter for screening for nutritional disorder: Secondary | ICD-10-CM

## 2024-02-20 DIAGNOSIS — F3341 Major depressive disorder, recurrent, in partial remission: Secondary | ICD-10-CM

## 2024-02-20 DIAGNOSIS — R5383 Other fatigue: Secondary | ICD-10-CM

## 2024-02-20 MED ORDER — VENLAFAXINE HCL ER 75 MG PO CP24: 75.0000 mg | ORAL_CAPSULE | Freq: Every day | ORAL | 1 refills | Status: DC

## 2024-04-06 ENCOUNTER — Ambulatory Visit: Admitting: Psychiatry
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
## 2024-04-16 ENCOUNTER — Telehealth: Admitting: Psychiatry

## 2024-04-16 ENCOUNTER — Encounter: Payer: Self-pay | Admitting: Psychiatry

## 2024-04-16 DIAGNOSIS — F3342 Major depressive disorder, recurrent, in full remission: Secondary | ICD-10-CM

## 2024-04-16 MED ORDER — VENLAFAXINE HCL ER 75 MG PO CP24: 75.0000 mg | ORAL_CAPSULE | Freq: Every day | ORAL | 2 refills | Status: DC

## 2024-04-16 NOTE — Patient Instructions (Signed)
 Continue venlafaxine  75 mg daily  Obtain labs- Vitamin D , TSH Next appointment- 12/5 at 8 am

## 2024-04-20 DIAGNOSIS — Z79891 Long term (current) use of opiate analgesic: Secondary | ICD-10-CM | POA: Diagnosis not present

## 2024-04-20 DIAGNOSIS — M549 Dorsalgia, unspecified: Secondary | ICD-10-CM | POA: Diagnosis not present

## 2024-04-20 DIAGNOSIS — G894 Chronic pain syndrome: Secondary | ICD-10-CM | POA: Diagnosis not present

## 2024-04-20 DIAGNOSIS — M5116 Intervertebral disc disorders with radiculopathy, lumbar region: Secondary | ICD-10-CM | POA: Diagnosis not present

## 2024-04-23 ENCOUNTER — Other Ambulatory Visit: Payer: Self-pay | Admitting: Podiatry

## 2024-04-23 DIAGNOSIS — B353 Tinea pedis: Secondary | ICD-10-CM

## 2024-04-29 ENCOUNTER — Ambulatory Visit: Admitting: Podiatry

## 2024-05-03 DIAGNOSIS — S40211A Abrasion of right shoulder, initial encounter: Secondary | ICD-10-CM | POA: Diagnosis not present

## 2024-05-03 DIAGNOSIS — M7551 Bursitis of right shoulder: Secondary | ICD-10-CM | POA: Diagnosis not present

## 2024-05-03 DIAGNOSIS — Z6833 Body mass index (BMI) 33.0-33.9, adult: Secondary | ICD-10-CM | POA: Diagnosis not present

## 2024-05-03 DIAGNOSIS — M25511 Pain in right shoulder: Secondary | ICD-10-CM | POA: Diagnosis not present

## 2024-05-07 ENCOUNTER — Ambulatory Visit (INDEPENDENT_AMBULATORY_CARE_PROVIDER_SITE_OTHER): Admitting: Podiatry

## 2024-05-07 ENCOUNTER — Encounter: Payer: Self-pay | Admitting: Podiatry

## 2024-05-07 DIAGNOSIS — M79674 Pain in right toe(s): Secondary | ICD-10-CM

## 2024-05-07 DIAGNOSIS — B351 Tinea unguium: Secondary | ICD-10-CM

## 2024-05-07 DIAGNOSIS — M79675 Pain in left toe(s): Secondary | ICD-10-CM | POA: Diagnosis not present

## 2024-05-07 DIAGNOSIS — D696 Thrombocytopenia, unspecified: Secondary | ICD-10-CM

## 2024-05-07 NOTE — Progress Notes (Signed)
 This patient presents to the office with chief complaint of long thick painful nails.  Patient says the nails are painful walking and wearing shoes.  This patient is unable to self treat.  This patient is unable to trim his  nails since he is unable to reach his  nails.  he presents to the office for preventative foot care services. He also requests refill on his ketoconazole .  General Appearance  Alert, conversant and in no acute stress.  Vascular  Dorsalis pedis and posterior tibial  pulses are palpable  bilaterally.  Capillary return is within normal limits  bilaterally. Temperature is within normal limits  bilaterally.  Neurologic  Senn-Weinstein monofilament wire test within normal limits  bilaterally. Muscle power within normal limits bilaterally.  Nails Thick disfigured discolored nails with subungual debris  from hallux to fifth toes bilaterally. No evidence of bacterial infection or drainage bilaterally.  Orthopedic  No limitations of motion  feet .  No crepitus or effusions noted.  No bony pathology or digital deformities noted.  Skin  normotropic skin with no porokeratosis noted bilaterally.  No signs of infections or ulcers noted.     Onychomycosis  Nails  B/L.  Pain in right toes  Pain in left toes  Tinea pedis  B/L.  Debridement of nails both feet followed trimming the nails with dremel tool.  Patient says he has faithfully applied the ketoconazole  and skin has improved.   RTC 3 months.   Ruffin Cotton DPM

## 2024-05-24 ENCOUNTER — Encounter: Payer: Self-pay | Admitting: Neurology

## 2024-05-29 DIAGNOSIS — N433 Hydrocele, unspecified: Secondary | ICD-10-CM | POA: Diagnosis not present

## 2024-05-29 DIAGNOSIS — R319 Hematuria, unspecified: Secondary | ICD-10-CM | POA: Diagnosis not present

## 2024-05-29 DIAGNOSIS — R1030 Lower abdominal pain, unspecified: Secondary | ICD-10-CM | POA: Diagnosis not present

## 2024-05-29 DIAGNOSIS — N50812 Left testicular pain: Secondary | ICD-10-CM | POA: Diagnosis not present

## 2024-05-29 DIAGNOSIS — N451 Epididymitis: Secondary | ICD-10-CM | POA: Diagnosis not present

## 2024-05-30 DIAGNOSIS — R1030 Lower abdominal pain, unspecified: Secondary | ICD-10-CM | POA: Diagnosis not present

## 2024-05-30 NOTE — ED Provider Notes (Addendum)
 Emergency Department Provider Note    ED Clinical Impression   Final diagnoses:  Left epididymitis (Primary)  Pain in left testicle  Hematuria, unspecified type    ED Assessment/Plan    Condition: Stable Disposition: Discharge  This chart has been completed using Dragon Medical Dictation software, and while attempts have been made to ensure accuracy, certain words and phrases may not be transcribed as intended.   History   Chief Complaint  Patient presents with  . Groin Pain   HPI  Dale Bender is a 53 y.o. male  who presents today to the  emergency department complaining of left testicular pain.  Patient has a history of similar symptoms before in the past.  No blunt injury.  No dysuria.    Allergies: has no known allergies. Medications: has a current medication list which includes the following long-term medication(s): lamotrigine  and venlafaxine . PMHx:  has a past medical history of Depression, GERD (gastroesophageal reflux disease), and Seizures    (CMS-HCC). PSHx:  has a past surgical history that includes arm fracture; Leg Surgery; Urethral dilation; and Hernia repair. SocHx:  reports that he has never smoked. He has never used smokeless tobacco. He reports that he does not use drugs. Allergies, Medications, Medical, Surgical, and Social History were reviewed as documented above.  Social Drivers of Health with Concerns   Food Insecurity: Not on file  Tobacco Use: Medium Risk (05/07/2024)   Received from [facility]   Patient History   . Smoking Tobacco Use: Former   . Smokeless Tobacco Use: Never   . Passive Exposure: Not on file  Transportation Needs: Not on file  Alcohol  Use: Not on file  Housing: Not on file  Physical Activity: Not on file  Utilities: Not on file  Stress: Not on file  Interpersonal Safety: Not on file  Substance Use: Not on file (06/18/2023)  Intimate Partner Violence: Not on file  Social Connections: Not on file  Financial  Resource Strain: Not on file  Health Literacy: Not on file  Internet Connectivity: Not on file    Review Of Systems  Review of Systems  Constitutional:  Negative for chills and fever.  HENT:  Negative for ear pain.   Respiratory:  Negative for chest tightness and shortness of breath.   Cardiovascular:  Negative for chest pain.  Gastrointestinal:  Negative for abdominal pain.  Genitourinary:  Negative for flank pain.  Musculoskeletal:  Negative for back pain.  Skin:  Negative for rash and wound.  Neurological:  Negative for weakness.  Psychiatric/Behavioral:  Negative for suicidal ideas.   All other systems reviewed and are negative.   Physical Exam   BP 131/77   Pulse 85   Temp 37 C (98.6 F) (Oral)   Resp 22   Ht 185.4 cm (6' 1)   Wt (!) 117.5 kg (259 lb)   SpO2 100%   BMI 34.17 kg/m   Physical Exam Vitals and nursing note reviewed.  Constitutional:      Appearance: He is not ill-appearing, toxic-appearing or diaphoretic.  HENT:     Head: Normocephalic.     Nose: Nose normal.     Mouth/Throat:     Mouth: Mucous membranes are moist.  Eyes:     Conjunctiva/sclera: Conjunctivae normal.  Cardiovascular:     Rate and Rhythm: Normal rate.  Pulmonary:     Effort: Pulmonary effort is normal.  Abdominal:     General: There is no distension.  Musculoskeletal:  General: No deformity.     Cervical back: Neck supple.  Skin:    General: Skin is warm and dry.  Neurological:     Mental Status: He is alert. Mental status is at baseline.  Psychiatric:        Behavior: Behavior normal.     ED Course  Medical Decision Making Differential diagnosis does include torsion, epididymitis, orchitis, UTI, pyelonephritis, cellulitis, abscess among others.  Ultrasound is negative for torsion. I suspect patient symptoms are secondary to orchitis. DC home on antibiotic follow-up with urology    Amount and/or Complexity of Data Reviewed Labs: ordered. Radiology:  ordered.  Risk Prescription drug management.     Procedures   No results found for this visit on 05/30/24 (from the past 4464 hours).        Alla Cleola Coffin, MD 07/03/24 1147    Alla Cleola Coffin, MD 07/03/24 1149

## 2024-06-15 DIAGNOSIS — M549 Dorsalgia, unspecified: Secondary | ICD-10-CM | POA: Diagnosis not present

## 2024-06-15 DIAGNOSIS — M5116 Intervertebral disc disorders with radiculopathy, lumbar region: Secondary | ICD-10-CM | POA: Diagnosis not present

## 2024-06-15 DIAGNOSIS — G894 Chronic pain syndrome: Secondary | ICD-10-CM | POA: Diagnosis not present

## 2024-06-15 DIAGNOSIS — Z79891 Long term (current) use of opiate analgesic: Secondary | ICD-10-CM | POA: Diagnosis not present

## 2024-06-16 NOTE — Progress Notes (Unsigned)
 Virtual Visit via Video Note  I connected with Dale Bender on 06/17/24 at  7:45 AM EST by a video enabled telemedicine application and verified that I am speaking with the correct person using two identifiers.  Location: Patient: at his home Provider: in the office    I discussed the limitations of evaluation and management by telemedicine and the availability of in person appointments. The patient expressed understanding and agreed to proceed.  History of Present Illness: Update 06/17/24 SS: VV, BIPAP report 97% usage, greater than 4 hours 93%, 20/15, leak 0, AHI 1.3. No issues with BIPAP, he is still tired during the day, thinks medication related. Remains on Lamictal  XR 200 mg, 2 tablets at bedtime. 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 Claims he was having severe back pain while working, so bad threw him into seizure. Claims felt drained after. He does drive. Labs 05/30/24 creatinine 1.32, alk phos 120  June 24, 2023 SS: Here today via video visit for follow-up, BiPAP ST download looks very good.  He does mention that he feels like the mask is leaking, but his leak is 0.  He will then tighten his mask. BiPAP download 05/25/2023-06/26/23 shows usage 26/30 days at 87%, greater than 4 hours 83%.  Average usage days used 7 hours 6 minutes. IPAP 20 cm, EPAP 15 cm.  Leak is 0, AHI 1.5.  He continues with daytime sleepiness.  He is on disability.  He does drive.  No seizures reported.  Remains on Lamictal  XR 200 mg, 2 tablets daily.  ESS was 12.  He is getting his BiPAP supplies from his DME.  I reviewed recent CBC, CMP from October 2024.   December 09, 2022 SS: BiPAP download is attached below, overall compliance looks fairly good, 77%.  AHI 1.3, leak 3.4. For seizures, remains on Lamictal  XR 200 mg daily. Is using full face. He did travel  in early April, but had run out of supplies, so he couldn't use. He has been getting his supplies from [employer], but they don't fit as well as when purchased from DME. With the [employer] supplies after a few days the mask would split at the nose piece. With BIPAP, he sleeps better, no snoring. He does still have some lingering daytime sleepiness, sometimes naps. He is on disability. Claims mental health is doing well. Denies any seizures. Still on Lamictal  XR 200 mg, 2 tablets at bedtime.  Takes Zoloft  from psych. He can no longer afford to buy supplies from [employer]. Denies any health issues. Lamictal  level was 8.19 March 2022.     Dr. Buck 09/19/2020: I reviewed his BiPAP compliance data from 06/19/2021 through 07/18/2021, which is a total of 30 days, during which time he used his machine 29 days with percent use days greater than 4 hours at 93%, indicating excellent compliance with an average usage of 7 hours and 3 minutes, residual AHI at goal at 1.4/h, leak on the low side with a 95th percentile at 1.8 L/min, pressure of 20/15 centimeters with a rate of 68/min.  He reports still feeling sleepy during the day and fatigue.  Of note, he takes sertraline  200 mg at bedtime and Lamictal  200 mg at bedtime.  He is advised to talk to his psychiatrist about changing his medication potentially if it is feasible.  He does state  that they tried a new medication recently and he only took it for 1 day and had a reaction to it including changes in mood and vomiting.  He is up-to-date with his BiPAP supplies.  He is able to tolerate the pressure.  He uses a full facemask.  He does have some residual leak and snoring but I explained to him that his leak is actually rather small when I look at the download, especially considering the fact that he uses a fullface mask and the pressure is quite high.  He reports that he does not currently drive.    Observations/Objective: Via video visit, is alert and oriented, speech is clear and  concise, follows exam commands, walks independently moves extremities well  Assessment and Plan: 1.  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  Will continue current settings.  Recommend continue nightly usage for minimum of 4 hours.  He will continue to get supplies through his DME. Eligible for a new machine next year, consider at next visit.  -Setup Feb 2021  - Split-night sleep study October 2022 severe OSA with central component with a total AHI of 78.7/hour, REM AHI of 95.2/hour, and O2 nadir of 69   - CPAP titration November 2020 needed BiPAP ST  2.  Seizures -Reports seizure event about 1 month ago, provoked by severe pain, nonepileptic versus epileptic event. EMU admission in February 2021 felt consistent with nonepileptic events, recommended continue Lamictal  -Check EEG, check Lamictal  level when comes for EEG -Continue Lamictal  XR 200 mg, 2 tablets at bedtime -No driving until seizure-free for 6 months -Call for seizure activity  Meds ordered this encounter  Medications   LamoTRIgine  200 MG TB24 24 hour tablet    Sig: Take 2 tablets (400 mg total) by mouth at bedtime.    Dispense:  180 tablet    Refill:  3   Follow Up Instructions: 1 year in office  I discussed the assessment and treatment plan with the patient. The patient was provided an opportunity to ask questions and all were answered. The patient agreed with the plan and demonstrated an understanding of the instructions.   The patient was advised to call back or seek an in-person evaluation if the symptoms worsen or if the condition fails to improve as anticipated.  Dale Gayland MANDES, DNP  [facility] [address] [place], [place] [address] [phone]

## 2024-06-17 ENCOUNTER — Telehealth (INDEPENDENT_AMBULATORY_CARE_PROVIDER_SITE_OTHER): Payer: Medicare HMO | Admitting: Neurology

## 2024-06-17 DIAGNOSIS — G4733 Obstructive sleep apnea (adult) (pediatric): Secondary | ICD-10-CM | POA: Diagnosis not present

## 2024-06-17 DIAGNOSIS — R569 Unspecified convulsions: Secondary | ICD-10-CM | POA: Diagnosis not present

## 2024-06-17 DIAGNOSIS — F445 Conversion disorder with seizures or convulsions: Secondary | ICD-10-CM

## 2024-06-17 DIAGNOSIS — G40909 Epilepsy, unspecified, not intractable, without status epilepticus: Secondary | ICD-10-CM

## 2024-06-17 MED ORDER — LAMOTRIGINE ER 200 MG PO TB24: 2.0000 | ORAL_TABLET | Freq: Every day | ORAL | 3 refills | Status: AC

## 2024-06-17 NOTE — Patient Instructions (Signed)
 Continue Lamictal  for seizure prevention Check EEG, when you come for EEG check Lamictal  level Call for recurrent seizures, no driving until seizure-free 6 months Continue BiPAP nightly minimum 4 hours, should be due for new machine next year Plan to follow-up in 1 year, sooner if needed.  Keep me posted.  Thanks!!

## 2024-06-20 ENCOUNTER — Encounter (HOSPITAL_COMMUNITY): Payer: Self-pay

## 2024-06-20 ENCOUNTER — Emergency Department (HOSPITAL_COMMUNITY)

## 2024-06-20 ENCOUNTER — Other Ambulatory Visit: Payer: Self-pay

## 2024-06-20 ENCOUNTER — Emergency Department (HOSPITAL_COMMUNITY): Admission: EM | Admit: 2024-06-20 | Discharge: 2024-06-20 | Disposition: A | Discharge status: Home / Self Care

## 2024-06-20 DIAGNOSIS — R569 Unspecified convulsions: Secondary | ICD-10-CM | POA: Insufficient documentation

## 2024-06-20 DIAGNOSIS — R9431 Abnormal electrocardiogram [ECG] [EKG]: Secondary | ICD-10-CM | POA: Diagnosis not present

## 2024-06-20 DIAGNOSIS — R519 Headache, unspecified: Secondary | ICD-10-CM | POA: Diagnosis not present

## 2024-06-20 LAB — CBC WITH DIFFERENTIAL/PLATELET
Abs Immature Granulocytes: 0 K/uL (ref 0.00–0.07)
Basophils Absolute: 0 K/uL (ref 0.0–0.1)
Basophils Relative: 0 %
Eosinophils Absolute: 0 K/uL (ref 0.0–0.5)
Eosinophils Relative: 0 %
HCT: 39.6 % (ref 39.0–52.0)
Hemoglobin: 13.4 g/dL (ref 13.0–17.0)
Immature Granulocytes: 0 %
Lymphocytes Relative: 40 %
Lymphs Abs: 1.6 K/uL (ref 0.7–4.0)
MCH: 29.5 pg (ref 26.0–34.0)
MCHC: 33.8 g/dL (ref 30.0–36.0)
MCV: 87 fL (ref 80.0–100.0)
Monocytes Absolute: 0.4 K/uL (ref 0.1–1.0)
Monocytes Relative: 9 %
Neutro Abs: 2 K/uL (ref 1.7–7.7)
Neutrophils Relative %: 51 %
Platelets: 146 K/uL — ABNORMAL LOW (ref 150–400)
RBC: 4.55 MIL/uL (ref 4.22–5.81)
RDW: 12 % (ref 11.5–15.5)
WBC: 4 K/uL (ref 4.0–10.5)
nRBC: 0 % (ref 0.0–0.2)

## 2024-06-20 LAB — URINALYSIS, ROUTINE W REFLEX MICROSCOPIC
Bilirubin Urine: NEGATIVE
Glucose, UA: NEGATIVE mg/dL
Hgb urine dipstick: NEGATIVE
Ketones, ur: NEGATIVE mg/dL
Leukocytes,Ua: NEGATIVE
Nitrite: NEGATIVE
Protein, ur: NEGATIVE mg/dL
Specific Gravity, Urine: 1.018 (ref 1.005–1.030)
pH: 6 (ref 5.0–8.0)

## 2024-06-20 LAB — CBG MONITORING, ED: Glucose-Capillary: 88 mg/dL (ref 70–99)

## 2024-06-20 LAB — BASIC METABOLIC PANEL WITH GFR
Anion gap: 13 (ref 5–15)
BUN: 17 mg/dL (ref 6–20)
CO2: 25 mmol/L (ref 22–32)
Calcium: 9.6 mg/dL (ref 8.9–10.3)
Chloride: 107 mmol/L (ref 98–111)
Creatinine, Ser: 1.42 mg/dL — ABNORMAL HIGH (ref 0.61–1.24)
GFR, Estimated: 59 mL/min — ABNORMAL LOW (ref >60)
Glucose, Bld: 87 mg/dL (ref 70–99)
Potassium: 4.4 mmol/L (ref 3.5–5.1)
Sodium: 145 mmol/L (ref 135–145)

## 2024-06-20 LAB — MAGNESIUM: Magnesium: 1.9 mg/dL (ref 1.7–2.4)

## 2024-06-20 MED ORDER — LAMOTRIGINE 25 MG PO TABS
200.0000 mg | ORAL_TABLET | Freq: Once | ORAL | Status: AC
  Administered 2024-06-20: 200 mg via ORAL
  Filled 2024-06-20: qty 8

## 2024-06-20 MED ORDER — LACTATED RINGERS IV BOLUS
500.0000 mL | Freq: Once | INTRAVENOUS | Status: AC
  Administered 2024-06-20: 500 mL via INTRAVENOUS

## 2024-06-20 NOTE — ED Provider Notes (Signed)
  EMERGENCY DEPARTMENT AT [facility] Provider Note   CSN: [id number] Arrival date & time: 06/20/24  1259     Patient presents with: Seizures   Dale Bender is a 53 y.o. male.   53 year old male with history of seizures presents for evaluation of seizures.  Per EMS report he was at church when he had 2 seizures.  EMS states he had another 1 in the truck on the way here and they gave him 2.5 mg of Versed .  Is a patient seem to have a postictal period after his first seizure at the church.  Patient is on Lamictal  200 mg daily.  Patient states he feels tired but he is awake and alert with stable vital signs.  He denies any other symptoms or complaints at this time.  States he has been compliant with his medication.        Prior to Admission medications   Medication Sig Start Date End Date Taking? Authorizing Provider  albuterol  (VENTOLIN  HFA) 108 (90 Base) MCG/ACT inhaler Inhale 2 puffs into the lungs every 6 (six) hours as needed for wheezing or shortness of breath. 08/06/19   Maree, Pratik D, DO  buprenorphine (BUTRANS) 10 MCG/HR PTWK 1 patch once a week. 03/29/22   [provider]  ketoconazole  (NIZORAL ) 2 % cream APPLY TO THE AFFECTED AREA(S) DAILY 04/23/24   Loreda Hacker, DPM  LamoTRIgine  200 MG TB24 24 hour tablet Take 2 tablets (400 mg total) by mouth at bedtime. 06/17/24   Gayland Lauraine JINNY, NP  omeprazole (PRILOSEC) 40 MG capsule Take 40 mg by mouth daily. 07/14/19   [provider]  pantoprazole  (PROTONIX ) 40 MG tablet Take 40 mg by mouth daily. 05/22/23   [provider]  venlafaxine  XR (EFFEXOR -XR) 75 MG 24 hr capsule Take 1 capsule (75 mg total) by mouth daily with breakfast. 04/26/24 07/25/24  Vickey Mettle, MD    Allergies: Patient has no known allergies.    Review of Systems  Constitutional:  Negative for chills and fever.  HENT:  Negative for ear pain and sore throat.   Eyes:  Negative for pain and visual disturbance.   Respiratory:  Negative for cough and shortness of breath.   Cardiovascular:  Negative for chest pain and palpitations.  Gastrointestinal:  Negative for abdominal pain and vomiting.  Genitourinary:  Negative for dysuria and hematuria.  Musculoskeletal:  Negative for arthralgias and back pain.  Skin:  Negative for color change and rash.  Neurological:  Negative for seizures and syncope.  All other systems reviewed and are negative.   Updated Vital Signs BP 128/87   Pulse 80   Temp 98.6 F (37 C) (Oral)   Resp 16   Ht 6' 1 (1.854 m)   Wt 111.1 kg   SpO2 97%   BMI 32.32 kg/m   Physical Exam Vitals and nursing note reviewed.  Constitutional:      General: He is not in acute distress.    Appearance: Normal appearance. He is well-developed. He is not ill-appearing.  HENT:     Head: Normocephalic and atraumatic.  Eyes:     Conjunctiva/sclera: Conjunctivae normal.  Cardiovascular:     Rate and Rhythm: Normal rate and regular rhythm.     Heart sounds: No murmur heard. Pulmonary:     Effort: Pulmonary effort is normal. No respiratory distress.     Breath sounds: Normal breath sounds.  Abdominal:     Palpations: Abdomen is soft.     Tenderness:  There is no abdominal tenderness.  Musculoskeletal:        General: No swelling.     Cervical back: Neck supple.  Skin:    General: Skin is warm and dry.     Capillary Refill: Capillary refill takes less than 2 seconds.  Neurological:     General: No focal deficit present.     Mental Status: He is alert.  Psychiatric:        Mood and Affect: Mood normal.     (all labs ordered are listed, but only abnormal results are displayed) Labs Reviewed  BASIC METABOLIC PANEL WITH GFR - Abnormal; Notable for the following components:      Result Value   Creatinine, Ser 1.42 (*)    GFR, Estimated 59 (*)    All other components within normal limits  CBC WITH DIFFERENTIAL/PLATELET - Abnormal; Notable for the following components:    Platelets 146 (*)    All other components within normal limits  URINALYSIS, ROUTINE W REFLEX MICROSCOPIC  MAGNESIUM  CBG MONITORING, ED    EKG: EKG Interpretation Date/Time:  Sunday June 20 2024 13:02:33 EST Ventricular Rate:  98 PR Interval:  181 QRS Duration:  74 QT Interval:  310 QTC Calculation: 396 R Axis:   62  Text Interpretation: Sinus rhythm Borderline repolarization abnormality Compared with prior EKG from 04/03/2022 Confirmed by Gennaro Bouchard ([id number]) on 06/20/2024 1:06:53 PM  Radiology: CT Head Wo Contrast Result Date: 06/20/2024 EXAM: CT HEAD WITHOUT CONTRAST 06/20/2024 02:20:46 PM TECHNIQUE: CT of the head was performed without the administration of intravenous contrast. Automated exposure control, iterative reconstruction, and/or weight based adjustment of the mA/kV was utilized to reduce the radiation dose to as low as reasonably achievable. COMPARISON: None available. CLINICAL HISTORY: Seizure, headaches. FINDINGS: BRAIN AND VENTRICLES: No acute hemorrhage. No evidence of acute infarct. No hydrocephalus. No extra-axial collection. No mass effect or midline shift. ORBITS: No acute abnormality. SINUSES: No acute abnormality. SOFT TISSUES AND SKULL: No skull fracture. IMPRESSION: 1. No acute intracranial abnormality. Electronically signed by: Gilmore Molt MD 06/20/2024 02:46 PM EST RP Workstation: [id number]     Procedures   Medications Ordered in the ED  lamoTRIgine  (LAMICTAL ) tablet 200 mg (200 mg Oral Given 06/20/24 1317)  lactated ringers  bolus 500 mL (0 mLs Intravenous Stopped 06/20/24 1506)                                    Medical Decision Making Cardiac monitor interpretation: Sinus rhythm, no ectopy  Patient with history of seizures, epileptic and nonepileptic here for witnessed seizure at church and again by EMS.  EMS gave him Versed .  He is awake and alert and responsive in the ER with stable vital signs.  Workup largely negative.  I gave him extra  dose of lacosamide here.  He was also given IV fluids.  On reevaluation he is feeling much better.  No further seizures.  Discussed all workup and plan with patient and family at bedside.  Advise close follow-up with neurology and to continue his medications as prescribed.  They feel comfortable to plan to be discharged home with the patient.  Patient feels comfortable with this plan.  Advised return for new or worsening symptoms.  Problems Addressed: Seizure [facility]): acute illness or injury  Amount and/or Complexity of Data Reviewed External Data Reviewed: notes.    Details: Prior outpatient records reviewed and patient follows with neurology, has  been diagnosed with epileptic and nonepileptic seizures Labs: ordered. Decision-making details documented in ED Course.    Details: Ordered and reviewed by me and unremarkable Radiology: ordered and independent interpretation performed. Decision-making details documented in ED Course.    Details: Ordered and interpreted by me independently radiology CT head: Shows no acute abnormality ECG/medicine tests: ordered and independent interpretation performed. Decision-making details documented in ED Course.    Details: Ordered and inter by me in the absence of cardiology shows sinus rhythm, no STEMI or significant change when compared to prior EKG  Risk OTC drugs. Prescription drug management. Drug therapy requiring intensive monitoring for toxicity.     Final diagnoses:  Seizure [facility])    ED Discharge Orders     None          Gennaro Duwaine CROME, DO 06/20/24 1520

## 2024-06-20 NOTE — Discharge Instructions (Addendum)
Continue your medications as prescribed. Follow-up with your neurologist.

## 2024-06-20 NOTE — ED Triage Notes (Signed)
 Pt bib [facility] with complaints of seizures. Pt was at church where he had 3 seizures that lasted about 1-3 min each. Pt was responsive when ems arrived. In route patient also had another seizure that lasted about 3 min. EMS administered 2.5 of versed . Pt is alert and oriented at this time.

## 2024-06-28 DIAGNOSIS — R5383 Other fatigue: Secondary | ICD-10-CM | POA: Diagnosis not present

## 2024-06-28 DIAGNOSIS — N1831 Chronic kidney disease, stage 3a: Secondary | ICD-10-CM | POA: Diagnosis not present

## 2024-06-28 DIAGNOSIS — Z13220 Encounter for screening for lipoid disorders: Secondary | ICD-10-CM | POA: Diagnosis not present

## 2024-06-28 DIAGNOSIS — Z125 Encounter for screening for malignant neoplasm of prostate: Secondary | ICD-10-CM | POA: Diagnosis not present

## 2024-07-06 ENCOUNTER — Telehealth: Payer: Medicare HMO | Admitting: Neurology
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
## 2024-07-16 ENCOUNTER — Telehealth: Admitting: Psychiatry

## 2024-07-16 ENCOUNTER — Encounter: Payer: Self-pay | Admitting: Psychiatry

## 2024-07-16 DIAGNOSIS — F3342 Major depressive disorder, recurrent, in full remission: Secondary | ICD-10-CM

## 2024-07-16 MED ORDER — VENLAFAXINE HCL ER 75 MG PO CP24: 75.0000 mg | ORAL_CAPSULE | Freq: Every day | ORAL | 2 refills | Status: AC

## 2024-07-19 ENCOUNTER — Telehealth: Payer: Self-pay

## 2024-07-19 NOTE — Telephone Encounter (Signed)
 Please advise to look into the system- the order was placed.

## 2024-07-29 ENCOUNTER — Telehealth: Payer: Self-pay | Admitting: Neurology

## 2024-07-29 DIAGNOSIS — G40909 Epilepsy, unspecified, not intractable, without status epilepticus: Secondary | ICD-10-CM

## 2024-07-29 NOTE — Telephone Encounter (Signed)
 I received ophthalmology evaluation from visit date 07/23/24 Dr. Willma Moats.  Reason for visit of dry eye recheck.  Notes indicate recent seizure.  Vision is overall stable with no decline.  Looks like he went to [facility] 06/20/2024 for 2 seizures while at church.  Had another when EMS in the truck given 2.5 mg of Versed .  I last saw him 06/17/2024 for video visit for BiPAP.  Was taking Lamictal  XR 200 mg, 2 tablets at bedtime.  Had a seizure the past month.  I ordered an EEG and a Lamictal  level.  I called him, he did not answer. Please offer him a visit with me or Dr. Onita to discuss seizures. Also, ask him to schedule EEG and can have Lamictal  level checked when he comes. Order should be in.   Did he miss any doses of Lamictal ? It should as though it was felt that he had a true epileptic seizure event.

## 2024-08-02 ENCOUNTER — Other Ambulatory Visit: Payer: Self-pay | Admitting: Podiatry

## 2024-08-02 DIAGNOSIS — B353 Tinea pedis: Secondary | ICD-10-CM

## 2024-08-02 NOTE — Telephone Encounter (Signed)
 Called and scheduled for dr. Onita appt and advised to get labs  Didn't mention missing meds.  Advised to schedule eeg and check labs

## 2024-08-03 LAB — VITAMIN D 25 HYDROXY (VIT D DEFICIENCY, FRACTURES): Vit D, 25-Hydroxy: 14.1 ng/mL — ABNORMAL LOW (ref 30.0–100.0)

## 2024-08-03 LAB — TSH: TSH: 1.63 u[IU]/mL (ref 0.450–4.500)

## 2024-08-06 ENCOUNTER — Ambulatory Visit: Payer: Self-pay | Admitting: Psychiatry

## 2024-08-06 NOTE — Progress Notes (Signed)
 Please inform him that his vitamin D  level is below the normal range. Please advise him to contact his primary care provider for further guidance and to discuss whether supplementation may be appropriate. His thyroid  levels are within the normal range.

## 2024-08-09 NOTE — Progress Notes (Signed)
 Called number on file, wife answered as she is not on ROI I was not able to let her know of message below I asked for her to call when Pt is with her. She verbalized understanding, no other concern.

## 2024-08-09 NOTE — Progress Notes (Signed)
Pt verbalized understanding and confirmed understanding.  

## 2024-08-13 ENCOUNTER — Ambulatory Visit (INDEPENDENT_AMBULATORY_CARE_PROVIDER_SITE_OTHER): Admitting: Podiatry

## 2024-08-13 ENCOUNTER — Encounter: Payer: Self-pay | Admitting: Podiatry

## 2024-08-13 DIAGNOSIS — M79675 Pain in left toe(s): Secondary | ICD-10-CM | POA: Diagnosis not present

## 2024-08-13 DIAGNOSIS — B351 Tinea unguium: Secondary | ICD-10-CM

## 2024-08-13 DIAGNOSIS — D696 Thrombocytopenia, unspecified: Secondary | ICD-10-CM | POA: Diagnosis not present

## 2024-08-13 DIAGNOSIS — M79674 Pain in right toe(s): Secondary | ICD-10-CM

## 2024-08-13 NOTE — Progress Notes (Signed)
 This patient presents to the office with chief complaint of long thick painful nails.  Patient says the nails are painful walking and wearing shoes.  This patient is unable to self treat.  This patient is unable to trim his  nails since he is unable to reach his nails.  he presents to the office for preventative foot care services.  General Appearance  Alert, conversant and in no acute stress.  Vascular  Dorsalis pedis and posterior tibial  pulses are palpable  bilaterally.  Capillary return is within normal limits  bilaterally. Temperature is within normal limits  bilaterally.  Neurologic  Senn-Weinstein monofilament wire test within normal limits  bilaterally. Muscle power within normal limits bilaterally.  Nails Thick disfigured discolored nails with subungual debris  from hallux to fifth toes bilaterally. No evidence of bacterial infection or drainage bilaterally.  Orthopedic  No limitations of motion  feet .  No crepitus or effusions noted.  No bony pathology or digital deformities noted.  Skin  normotropic skin with no porokeratosis noted bilaterally.  No signs of infections or ulcers noted.     Onychomycosis  Nails  B/L.  Pain in right toes  Pain in left toes  Debridement of nails both feet followed trimming the nails with dremel tool.    RTC 3 months.   Helane Gunther DPM

## 2024-08-16 ENCOUNTER — Other Ambulatory Visit: Admitting: *Deleted

## 2024-08-23 ENCOUNTER — Telehealth: Payer: Self-pay | Admitting: Neurology

## 2024-08-23 ENCOUNTER — Ambulatory Visit: Admitting: Neurology

## 2024-08-23 DIAGNOSIS — G40909 Epilepsy, unspecified, not intractable, without status epilepticus: Secondary | ICD-10-CM | POA: Diagnosis not present

## 2024-08-23 NOTE — Telephone Encounter (Signed)
 Pt came in for EEG appointment. Wife is wondering if pt appt 2/12 with Dr. Onita can be moved to a Friday. This is the only day pt will have transportation to get to the office. Pt wife says she is off on 2/16 as well if there is a possibility of pt getting worked in that day.  Would like a call if able. [phone]

## 2024-08-23 NOTE — Telephone Encounter (Signed)
 Dr.Yan please see the below. You don't have any open slots on 09/27/24 (Monday) are you okay with double booking or okay move to a Friday?   Please advise

## 2024-08-24 NOTE — Telephone Encounter (Signed)
 Called and spoke to pt wife and she would rather stay the same and not be pushed out to schedule on a Friday

## 2024-08-24 NOTE — Telephone Encounter (Signed)
 Ok to move his appt to Friday, long hx of pseudoseizure, not urgent

## 2024-08-24 NOTE — Telephone Encounter (Signed)
 Phone room please call and scheduled on a Friday.

## 2024-09-07 NOTE — Procedures (Signed)
" ° °  HISTORY: 54 year old male presenting with seizure-like activity  TECHNIQUE:  This is a routine 16 channel EEG recording with one channel devoted to a limited EKG recording.  It was performed during wakefulness, drowsiness and asleep.  Hyperventilation and photic stimulation were performed as activating procedures.  There are minimum muscle and movement artifact noted.  Upon maximum arousal, posterior dominant waking rhythm consistent of rhythmic alpha range activity. Activities are symmetric over the bilateral posterior derivations and attenuated with eye opening.  Photic stimulation did not alter the tracing.  Hyperventilation produced mild/moderate buildup with higher amplitude and the slower activities noted.  During EEG recording, patient developed drowsiness and entered sleep, sleep EEG demonstrated architecture, there were frontal centrally dominant vertex waves and symmetric sleep spindles noted.  During EEG recording, there was no epileptiform discharge noted.  EKG demonstrate normal sinus rhythm.  CONCLUSION: This is a  normal awake and asleep EEG.  There is no electrodiagnostic evidence of epileptiform discharge.  Qianna Clagett, M.D. Ph.D.  [facility] [address] [place], [place] [address] Phone: [phone] Fax:      [phone]  "

## 2024-09-08 ENCOUNTER — Ambulatory Visit: Payer: Self-pay | Admitting: Neurology

## 2024-09-23 ENCOUNTER — Ambulatory Visit: Admitting: Neurology

## 2024-10-08 ENCOUNTER — Telehealth: Admitting: Psychiatry

## 2024-11-12 ENCOUNTER — Ambulatory Visit: Admitting: Podiatry

## 2025-06-21 ENCOUNTER — Ambulatory Visit: Admitting: Neurology
